# Patient Record
Sex: Female | Born: 1965 | Race: Black or African American | Hispanic: No | Marital: Married | Smoking: Never smoker
Health system: Southern US, Community
[De-identification: ages and names within clinical notes are randomized; demographics above are authoritative.]

## PROBLEM LIST (undated history)

## (undated) DIAGNOSIS — J302 Other seasonal allergic rhinitis: Secondary | ICD-10-CM

## (undated) DIAGNOSIS — Z9221 Personal history of antineoplastic chemotherapy: Secondary | ICD-10-CM

## (undated) DIAGNOSIS — C801 Malignant (primary) neoplasm, unspecified: Secondary | ICD-10-CM

## (undated) DIAGNOSIS — Z1379 Encounter for other screening for genetic and chromosomal anomalies: Secondary | ICD-10-CM

## (undated) DIAGNOSIS — K219 Gastro-esophageal reflux disease without esophagitis: Secondary | ICD-10-CM

## (undated) DIAGNOSIS — Z923 Personal history of irradiation: Secondary | ICD-10-CM

## (undated) HISTORY — PX: BREAST BIOPSY: SHX20

## (undated) HISTORY — DX: Encounter for other screening for genetic and chromosomal anomalies: Z13.79

## (undated) HISTORY — PX: TUBAL LIGATION: SHX77

## (undated) HISTORY — PX: WISDOM TOOTH EXTRACTION: SHX21

## (undated) HISTORY — PX: ABDOMINAL HYSTERECTOMY: SHX81

---

## 1997-09-15 ENCOUNTER — Encounter: Admission: RE | Admit: 1997-09-15 | Discharge: 1997-09-15 | Payer: Self-pay | Admitting: *Deleted

## 2008-11-25 ENCOUNTER — Emergency Department (HOSPITAL_BASED_OUTPATIENT_CLINIC_OR_DEPARTMENT_OTHER): Admission: EM | Admit: 2008-11-25 | Discharge: 2008-11-25 | Payer: Self-pay | Admitting: Emergency Medicine

## 2008-11-28 ENCOUNTER — Emergency Department (HOSPITAL_BASED_OUTPATIENT_CLINIC_OR_DEPARTMENT_OTHER): Admission: EM | Admit: 2008-11-28 | Discharge: 2008-11-28 | Payer: Self-pay | Admitting: Emergency Medicine

## 2009-01-07 ENCOUNTER — Emergency Department (HOSPITAL_BASED_OUTPATIENT_CLINIC_OR_DEPARTMENT_OTHER): Admission: EM | Admit: 2009-01-07 | Discharge: 2009-01-07 | Payer: Self-pay | Admitting: Emergency Medicine

## 2012-06-15 ENCOUNTER — Encounter (HOSPITAL_COMMUNITY): Payer: Self-pay | Admitting: Pharmacist

## 2012-06-16 ENCOUNTER — Encounter (HOSPITAL_COMMUNITY)
Admission: RE | Admit: 2012-06-16 | Discharge: 2012-06-16 | Disposition: A | Discharge status: Home / Self Care | Payer: BC Managed Care – PPO | Source: Ambulatory Visit | Attending: Obstetrics and Gynecology | Admitting: Obstetrics and Gynecology

## 2012-06-16 ENCOUNTER — Encounter (HOSPITAL_COMMUNITY): Payer: Self-pay

## 2012-06-16 HISTORY — DX: Gastro-esophageal reflux disease without esophagitis: K21.9

## 2012-06-16 LAB — CBC
HCT: 40.2 % (ref 36.0–46.0)
Platelets: 245 10*3/uL (ref 150–400)
RDW: 12.3 % (ref 11.5–15.5)
WBC: 9.3 10*3/uL (ref 4.0–10.5)

## 2012-06-16 LAB — SURGICAL PCR SCREEN: Staphylococcus aureus: NEGATIVE

## 2012-06-16 NOTE — Patient Instructions (Signed)
Your procedure is scheduled on:06/23/12  Enter through the Main Entrance at :7am Pick up desk phone and dial [phone] and inform us of your arrival.  Please call [phone] if you have any problems the morning of surgery.  Remember: Do not eat or drink after midnight:Tuesday   Take these meds the morning of surgery with a sip of water:none  DO NOT wear jewelry, eye make-up, lipstick,body lotion, or dark fingernail polish. Do not shave for 48 hours prior to surgery.  If you are to be admitted after surgery, leave suitcase in car until your room has been assigned. Patients discharged on the day of surgery will not be allowed to drive home.

## 2012-06-22 NOTE — H&P (Signed)
Kelsey Henry is an 47 y.o. female. She was seen in January for her annual exam.  She complained of heavy menses with severe cramps.  Exam was normal.  All medical and surgical options were discussed, she wishes to proceed with definitive surgical therapy and is admitted for this at this tim  Pertinent Gynecological History  Last mammogram: normal Date: 2012 Last pap: normal Date: 04-2012 OB History: G1, P1001   Menstrual History: No LMP recorded.    Past Medical History  Diagnosis Date  . GERD (gastroesophageal reflux disease)   . Medical history non-contributory     Past Surgical History  Procedure Laterality Date  . Tubal ligation      No family history on file.  Social History:  reports that she has never smoked. She does not have any smokeless tobacco history on file. She reports that she does not drink alcohol or use illicit drugs.  Allergies:  Allergies  Allergen Reactions  . Aspirin Hives    No prescriptions prior to admission    Review of Systems  Respiratory: Negative.   Cardiovascular: Negative.   Gastrointestinal: Negative.   Genitourinary: Negative.     There were no vitals taken for this visit. Physical Exam  Constitutional: She appears well-developed and well-nourished.  Neck: Neck supple. No thyromegaly present.  Cardiovascular: Normal rate, regular rhythm and normal heart sounds.   No murmur heard. Respiratory: Effort normal and breath sounds normal. No respiratory distress. She has no wheezes.  GI: Soft. She exhibits no distension and no mass. There is no tenderness.  Genitourinary: Vagina normal.  Uterus midplanar, normal size No adnexal mass or tenderness    No results found for this or any previous visit (from the past 24 hour(s)).  No results found.  Assessment/Plan: Menorrhagia and dysmenorrhea, she desires definitive surgical therapy.  The procedure, risks, alternatives and chances of relieving her symptoms have all been discussed.   She is being admitted for laparoscopic supracervical hysterectomy and bilateral salpingectomy, will leave ovaries unless abnormal.    MEISINGER,TODD D 06/22/2012, 8:55 PM

## 2012-06-23 ENCOUNTER — Ambulatory Visit (HOSPITAL_COMMUNITY): Payer: BC Managed Care – PPO | Admitting: Anesthesiology

## 2012-06-23 ENCOUNTER — Encounter (HOSPITAL_COMMUNITY)
Admission: RE | Disposition: A | Discharge status: Home / Self Care | Payer: Self-pay | Source: Ambulatory Visit | Attending: Obstetrics and Gynecology

## 2012-06-23 ENCOUNTER — Ambulatory Visit (HOSPITAL_COMMUNITY)
Admission: RE | Admit: 2012-06-23 | Discharge: 2012-06-23 | Disposition: A | Discharge status: Home / Self Care | Payer: BC Managed Care – PPO | Source: Ambulatory Visit | Attending: Obstetrics and Gynecology | Admitting: Obstetrics and Gynecology

## 2012-06-23 ENCOUNTER — Encounter (HOSPITAL_COMMUNITY): Payer: Self-pay | Admitting: Anesthesiology

## 2012-06-23 DIAGNOSIS — N946 Dysmenorrhea, unspecified: Secondary | ICD-10-CM | POA: Diagnosis present

## 2012-06-23 DIAGNOSIS — N920 Excessive and frequent menstruation with regular cycle: Secondary | ICD-10-CM | POA: Insufficient documentation

## 2012-06-23 DIAGNOSIS — N838 Other noninflammatory disorders of ovary, fallopian tube and broad ligament: Secondary | ICD-10-CM | POA: Insufficient documentation

## 2012-06-23 DIAGNOSIS — D251 Intramural leiomyoma of uterus: Secondary | ICD-10-CM | POA: Insufficient documentation

## 2012-06-23 HISTORY — PX: BILATERAL SALPINGECTOMY: SHX5743

## 2012-06-23 HISTORY — PX: LAPAROSCOPIC SUPRACERVICAL HYSTERECTOMY: SHX5399

## 2012-06-23 LAB — PREGNANCY, URINE: Preg Test, Ur: NEGATIVE

## 2012-06-23 SURGERY — HYSTERECTOMY, SUPRACERVICAL, LAPAROSCOPIC
Anesthesia: General | Site: Abdomen | Wound class: Clean Contaminated

## 2012-06-23 MED ORDER — ONDANSETRON HCL 4 MG PO TABS: 4.0000 mg | ORAL_TABLET | Freq: Four times a day (QID) | ORAL | Status: DC | PRN

## 2012-06-23 MED ORDER — DEXAMETHASONE SODIUM PHOSPHATE 10 MG/ML IJ SOLN
INTRAMUSCULAR | Status: AC
  Filled 2012-06-23: qty 1

## 2012-06-23 MED ORDER — PANTOPRAZOLE SODIUM 40 MG PO TBEC
DELAYED_RELEASE_TABLET | ORAL | Status: AC
  Administered 2012-06-23: 40 mg via ORAL
  Filled 2012-06-23: qty 1

## 2012-06-23 MED ORDER — ONDANSETRON HCL 4 MG/2ML IJ SOLN: 4.0000 mg | Freq: Four times a day (QID) | INTRAMUSCULAR | Status: DC | PRN

## 2012-06-23 MED ORDER — NEOSTIGMINE METHYLSULFATE 1 MG/ML IJ SOLN
INTRAMUSCULAR | Status: AC
  Filled 2012-06-23: qty 1

## 2012-06-23 MED ORDER — BUPIVACAINE HCL (PF) 0.25 % IJ SOLN
INTRAMUSCULAR | Status: DC | PRN
  Administered 2012-06-23: 12 mL

## 2012-06-23 MED ORDER — ROCURONIUM BROMIDE 50 MG/5ML IV SOLN
INTRAVENOUS | Status: AC
  Filled 2012-06-23: qty 1

## 2012-06-23 MED ORDER — MIDAZOLAM HCL 2 MG/2ML IJ SOLN: 0.5000 mg | Freq: Once | INTRAMUSCULAR | Status: DC | PRN

## 2012-06-23 MED ORDER — FAMOTIDINE 20 MG PO TABS: 20.0000 mg | ORAL_TABLET | Freq: Every day | ORAL | Status: DC

## 2012-06-23 MED ORDER — ONDANSETRON HCL 4 MG/2ML IJ SOLN
INTRAMUSCULAR | Status: DC | PRN
  Administered 2012-06-23: 4 mg via INTRAVENOUS

## 2012-06-23 MED ORDER — LACTATED RINGERS IV SOLN: INTRAVENOUS | Status: DC

## 2012-06-23 MED ORDER — HYDROMORPHONE HCL PF 1 MG/ML IJ SOLN
INTRAMUSCULAR | Status: AC
  Filled 2012-06-23: qty 1

## 2012-06-23 MED ORDER — KETOROLAC TROMETHAMINE 30 MG/ML IJ SOLN: 30.0000 mg | Freq: Four times a day (QID) | INTRAMUSCULAR | Status: DC

## 2012-06-23 MED ORDER — MIDAZOLAM HCL 2 MG/2ML IJ SOLN
INTRAMUSCULAR | Status: AC
  Filled 2012-06-23: qty 2

## 2012-06-23 MED ORDER — PANTOPRAZOLE SODIUM 40 MG PO TBEC: 40.0000 mg | DELAYED_RELEASE_TABLET | Freq: Every day | ORAL | Status: AC

## 2012-06-23 MED ORDER — GLYCOPYRROLATE 0.2 MG/ML IJ SOLN
INTRAMUSCULAR | Status: DC | PRN
  Administered 2012-06-23: 0.4 mg via INTRAVENOUS

## 2012-06-23 MED ORDER — LACTATED RINGERS IV SOLN
INTRAVENOUS | Status: DC
  Administered 2012-06-23 (×2): via INTRAVENOUS

## 2012-06-23 MED ORDER — GLYCOPYRROLATE 0.2 MG/ML IJ SOLN
INTRAMUSCULAR | Status: AC
  Filled 2012-06-23: qty 2

## 2012-06-23 MED ORDER — FENTANYL CITRATE 0.05 MG/ML IJ SOLN: 25.0000 ug | INTRAMUSCULAR | Status: DC | PRN

## 2012-06-23 MED ORDER — CEFAZOLIN SODIUM-DEXTROSE 2-3 GM-% IV SOLR
INTRAVENOUS | Status: AC
  Filled 2012-06-23: qty 50

## 2012-06-23 MED ORDER — MEPERIDINE HCL 25 MG/ML IJ SOLN: 6.2500 mg | INTRAMUSCULAR | Status: DC | PRN

## 2012-06-23 MED ORDER — ROCURONIUM BROMIDE 100 MG/10ML IV SOLN
INTRAVENOUS | Status: DC | PRN
  Administered 2012-06-23: 50 mg via INTRAVENOUS

## 2012-06-23 MED ORDER — LIDOCAINE HCL (CARDIAC) 20 MG/ML IV SOLN
INTRAVENOUS | Status: AC
  Filled 2012-06-23: qty 5

## 2012-06-23 MED ORDER — BUPIVACAINE HCL (PF) 0.25 % IJ SOLN
INTRAMUSCULAR | Status: AC
  Filled 2012-06-23: qty 30

## 2012-06-23 MED ORDER — DEXTROSE-NACL 5-0.45 % IV SOLN
INTRAVENOUS | Status: DC
  Administered 2012-06-23: 13:00:00 via INTRAVENOUS

## 2012-06-23 MED ORDER — MIDAZOLAM HCL 5 MG/5ML IJ SOLN
INTRAMUSCULAR | Status: DC | PRN
  Administered 2012-06-23: 2 mg via INTRAVENOUS

## 2012-06-23 MED ORDER — PROPOFOL 10 MG/ML IV BOLUS
INTRAVENOUS | Status: DC | PRN
  Administered 2012-06-23: 150 mg via INTRAVENOUS

## 2012-06-23 MED ORDER — CEFAZOLIN SODIUM-DEXTROSE 2-3 GM-% IV SOLR
2.0000 g | INTRAVENOUS | Status: AC
  Administered 2012-06-23: 2 g via INTRAVENOUS

## 2012-06-23 MED ORDER — KETOROLAC TROMETHAMINE 30 MG/ML IJ SOLN: 30.0000 mg | Freq: Once | INTRAMUSCULAR | Status: DC

## 2012-06-23 MED ORDER — ONDANSETRON HCL 4 MG/2ML IJ SOLN
INTRAMUSCULAR | Status: AC
  Filled 2012-06-23: qty 2

## 2012-06-23 MED ORDER — PROPOFOL 10 MG/ML IV EMUL
INTRAVENOUS | Status: AC
  Filled 2012-06-23: qty 20

## 2012-06-23 MED ORDER — OXYCODONE-ACETAMINOPHEN 5-325 MG PO TABS
1.0000 | ORAL_TABLET | ORAL | Status: DC | PRN
  Filled 2012-06-23: qty 1

## 2012-06-23 MED ORDER — FENTANYL CITRATE 0.05 MG/ML IJ SOLN
INTRAMUSCULAR | Status: AC
  Filled 2012-06-23: qty 5

## 2012-06-23 MED ORDER — DEXAMETHASONE SODIUM PHOSPHATE 10 MG/ML IJ SOLN
INTRAMUSCULAR | Status: DC | PRN
  Administered 2012-06-23: 10 mg via INTRAVENOUS

## 2012-06-23 MED ORDER — PROMETHAZINE HCL 25 MG/ML IJ SOLN: 6.2500 mg | INTRAMUSCULAR | Status: DC | PRN

## 2012-06-23 MED ORDER — ALUM & MAG HYDROXIDE-SIMETH 200-200-20 MG/5ML PO SUSP: 30.0000 mL | ORAL | Status: DC | PRN

## 2012-06-23 MED ORDER — FENTANYL CITRATE 0.05 MG/ML IJ SOLN
INTRAMUSCULAR | Status: DC | PRN
  Administered 2012-06-23 (×2): 50 ug via INTRAVENOUS
  Administered 2012-06-23: 100 ug via INTRAVENOUS
  Administered 2012-06-23: 50 ug via INTRAVENOUS

## 2012-06-23 MED ORDER — SIMETHICONE 80 MG PO CHEW
80.0000 mg | CHEWABLE_TABLET | Freq: Four times a day (QID) | ORAL | Status: DC | PRN
  Administered 2012-06-23: 80 mg via ORAL

## 2012-06-23 MED ORDER — NEOSTIGMINE METHYLSULFATE 1 MG/ML IJ SOLN
INTRAMUSCULAR | Status: DC | PRN
  Administered 2012-06-23: 2 mg via INTRAVENOUS

## 2012-06-23 MED ORDER — LIDOCAINE HCL (CARDIAC) 20 MG/ML IV SOLN
INTRAVENOUS | Status: DC | PRN
  Administered 2012-06-23: 50 mg via INTRAVENOUS

## 2012-06-23 MED ORDER — OXYCODONE-ACETAMINOPHEN 5-325 MG PO TABS: 1.0000 | ORAL_TABLET | ORAL | Status: DC | PRN

## 2012-06-23 MED ORDER — SODIUM CHLORIDE 0.9 % IJ SOLN
INTRAMUSCULAR | Status: DC | PRN
  Administered 2012-06-23: 10 mL

## 2012-06-23 MED ORDER — LACTATED RINGERS IR SOLN
Status: DC | PRN
  Administered 2012-06-23: 3000 mL

## 2012-06-23 MED ORDER — HYDROMORPHONE HCL PF 1 MG/ML IJ SOLN
INTRAMUSCULAR | Status: DC | PRN
  Administered 2012-06-23: 1 mg via INTRAVENOUS

## 2012-06-23 MED ORDER — MENTHOL 3 MG MT LOZG: 1.0000 | LOZENGE | OROMUCOSAL | Status: DC | PRN

## 2012-06-23 SURGICAL SUPPLY — 28 items
ADH SKN CLS APL DERMABOND .7 (GAUZE/BANDAGES/DRESSINGS) ×2
BARRIER ADHS 3X4 INTERCEED (GAUZE/BANDAGES/DRESSINGS) ×1 IMPLANT
BLADE LAPAROSCOPIC MORCELL KIT (BLADE) ×1 IMPLANT
BRR ADH 4X3 ABS CNTRL BYND (GAUZE/BANDAGES/DRESSINGS) ×2
CABLE HIGH FREQUENCY MONO STRZ (ELECTRODE) IMPLANT
CHLORAPREP W/TINT 26ML (MISCELLANEOUS) ×3 IMPLANT
CLOTH BEACON ORANGE TIMEOUT ST (SAFETY) ×3 IMPLANT
COVER MAYO STAND STRL (DRAPES) ×3 IMPLANT
DERMABOND ADVANCED (GAUZE/BANDAGES/DRESSINGS) ×1
DERMABOND ADVANCED .7 DNX12 (GAUZE/BANDAGES/DRESSINGS) ×2 IMPLANT
EVACUATOR SMOKE 8.L (FILTER) ×6 IMPLANT
GLOVE BIO SURGEON STRL SZ8 (GLOVE) ×3 IMPLANT
GLOVE ORTHO TXT STRL SZ7.5 (GLOVE) ×3 IMPLANT
GOWN STRL REIN XL XLG (GOWN DISPOSABLE) ×9 IMPLANT
NEEDLE INSUFFLATION 120MM (ENDOMECHANICALS) ×3 IMPLANT
NS IRRIG 1000ML POUR BTL (IV SOLUTION) ×3 IMPLANT
PACK LAPAROSCOPY BASIN (CUSTOM PROCEDURE TRAY) ×3 IMPLANT
SCALPEL HARMONIC ACE (MISCELLANEOUS) ×1 IMPLANT
SET IRRIG TUBING LAPAROSCOPIC (IRRIGATION / IRRIGATOR) ×3 IMPLANT
SUT VIC AB 3-0 PS2 18 (SUTURE) ×3
SUT VIC AB 3-0 PS2 18XBRD (SUTURE) ×2 IMPLANT
SUT VICRYL 0 UR6 27IN ABS (SUTURE) ×3 IMPLANT
TOWEL OR 17X24 6PK STRL BLUE (TOWEL DISPOSABLE) ×6 IMPLANT
TRAY FOLEY CATH 14FR (SET/KITS/TRAYS/PACK) ×3 IMPLANT
TROCAR XCEL 12X100 BLDLESS (ENDOMECHANICALS) ×3 IMPLANT
TROCAR XCEL NON-BLD 5MMX100MML (ENDOMECHANICALS) ×6 IMPLANT
WARMER LAPAROSCOPE (MISCELLANEOUS) ×3 IMPLANT
WATER STERILE IRR 1000ML POUR (IV SOLUTION) ×3 IMPLANT

## 2012-06-23 NOTE — Anesthesia Preprocedure Evaluation (Signed)
Anesthesia Evaluation  Patient identified by MRN, date of birth, ID band Patient awake    Reviewed: Allergy & Precautions, H&P , Patient's Chart, lab work & pertinent test results, reviewed documented beta blocker date and time   History of Anesthesia Complications Negative for: history of anesthetic complications  Airway Mallampati: II TM Distance: >3 FB Neck ROM: full    Dental no notable dental hx.    Pulmonary neg pulmonary ROS,  breath sounds clear to auscultation  Pulmonary exam normal       Cardiovascular Exercise Tolerance: Good negative cardio ROS  Rhythm:regular Rate:Normal     Neuro/Psych negative neurological ROS  negative psych ROS   GI/Hepatic negative GI ROS, Neg liver ROS, GERD-  ,  Endo/Other  negative endocrine ROS  Renal/GU negative Renal ROS     Musculoskeletal   Abdominal   Peds  Hematology negative hematology ROS (+)   Anesthesia Other Findings   Reproductive/Obstetrics negative OB ROS                           Anesthesia Physical Anesthesia Plan  ASA: II  Anesthesia Plan: General ETT   Post-op Pain Management:    Induction:   Airway Management Planned:   Additional Equipment:   Intra-op Plan:   Post-operative Plan:   Informed Consent: I have reviewed the patients History and Physical, chart, labs and discussed the procedure including the risks, benefits and alternatives for the proposed anesthesia with the patient or authorized representative who has indicated his/her understanding and acceptance.   Dental Advisory Given  Plan Discussed with: CRNA and Surgeon  Anesthesia Plan Comments:         Anesthesia Quick Evaluation

## 2012-06-23 NOTE — Interval H&P Note (Signed)
History and Physical Interval Note:  06/23/2012 8:01 AM  Kelsey Henry  has presented today for surgery, with the diagnosis of dysmenorrhea, [id number], [phone]  The various methods of treatment have been discussed with the patient and family. After consideration of risks, benefits and other options for treatment, the patient has consented to  Procedure(s): LAPAROSCOPIC SUPRACERVICAL HYSTERECTOMY (N/A) BILATERAL SALPINGECTOMY (Bilateral) as a surgical intervention .  The patient's history has been reviewed, patient examined, no change in status, stable for surgery.  I have reviewed the patient's chart and labs.  Questions were answered to the patient's satisfaction.     MEISINGER,TODD D

## 2012-06-23 NOTE — Progress Notes (Signed)
Post-op check Doing well, minimal pain, some nausea Afeb, VSS, voided Abd- soft, incisions ok Will d/c home if tolerates reg diet

## 2012-06-23 NOTE — Transfer of Care (Signed)
Immediate Anesthesia Transfer of Care Note  Patient: Kelsey Henry  Procedure(s) Performed: Procedure(s): LAPAROSCOPIC SUPRACERVICAL HYSTERECTOMY (N/A) BILATERAL SALPINGECTOMY (Bilateral)  Patient Location: PACU  Anesthesia Type:General  Level of Consciousness: sedated  Airway & Oxygen Therapy: Patient Spontanous Breathing and Patient connected to nasal cannula oxygen  Post-op Assessment: Report given to PACU RN and Post -op Vital signs reviewed and stable  Post vital signs: stable  Complications: No apparent anesthesia complications

## 2012-06-23 NOTE — Progress Notes (Signed)
Patient discharged home with husband. No home health health or equipment needed. Walked down to main entrance by D. Sandy Salaam, [place]. Patient in stable condition.

## 2012-06-23 NOTE — Anesthesia Postprocedure Evaluation (Signed)
  Anesthesia Post-op Note  Patient: Kelsey Henry  Procedure(s) Performed: Procedure(s): LAPAROSCOPIC SUPRACERVICAL HYSTERECTOMY (N/A) BILATERAL SALPINGECTOMY (Bilateral) Patient is awake and responsive. Pain and nausea are reasonably well controlled. Vital signs are stable and clinically acceptable. Oxygen saturation is clinically acceptable. There are no apparent anesthetic complications at this time. Patient is ready for discharge.

## 2012-06-23 NOTE — Anesthesia Postprocedure Evaluation (Signed)
  Anesthesia Post-op Note  Patient: Kelsey Henry  Procedure(s) Performed: Procedure(s): LAPAROSCOPIC SUPRACERVICAL HYSTERECTOMY (N/A) BILATERAL SALPINGECTOMY (Bilateral)  Patient Location: PACU and [facility] Unit  Anesthesia Type:General  Level of Consciousness: awake, alert  and oriented  Airway and Oxygen Therapy: Patient Spontanous Breathing  Post-op Pain: none  Post-op Assessment: Post-op Vital signs reviewed  Post-op Vital Signs: Reviewed and stable  Complications: No apparent anesthesia complications

## 2012-06-23 NOTE — Op Note (Signed)
Preoperative diagnosis: Dysmenorrhea, menorrhagia Postoperative diagnosis: Same Procedure: Laparoscopic supracervical hysterectomy, bilateral salpingectomy Surgeon: Lavina Hamman M.D. Assistant: Sherian Rein, MD Anesthesia: Gen. Endotracheal tube Findings: She had an essentially normal pelvis with evidence of prior BTL, nl appendix and upper abdomen Specimens: Morcellated uterus and bilateral tubes for routine pathology Estimated blood loss: 100 cc Complications: None  Procedure in detail: The patient was taken to the operating room and placed in the dorsosupine position. General anesthesia was induced. Arms were tucked to her sides and legs were placed in mobile stirrups. Abdomen perineum and vagina were then prepped and draped in usual sterile fashion and a Foley catheter was inserted. Infraumbilical skin was infiltrated with quarter percent Marcaine and a 1 cm vertical incision was made. A veress needle was inserted into the peritoneal cavity and placement confirmed by the water drop test an opening pressure of 3 mm of mercury. CO2 was insufflated to a pressure of 13 mm mercury and a veress needle was removed. A 5 mm trocar was then introduced with direct visualization with the laparoscope. A 5 mm port was then also placed on the right side under direct visualization. Inspection revealed the above-mentioned findings. A 12 mm port was placed on the left side under direct visualization. The distal left fallopian tube was grasped and elevated.  Using the Harmonic scalpel Ace, the distal tube was removed from the mesosalpinx and removed through the 12mm trocar.  The left uterine cornu was grasped with a single-tooth tenaculum from the right side. The Harmonic scalpel Ace was used to take down the left round ligament, utero-ovarian pedicle and broad ligament. The anterior peritoneum was incised across the anterior portion of the uterus to help release the bladder. Uterine artery artery was  skeletonized and taken down with the harmonic scalpel Ace with adequate division and adequate hemostasis. A similar procedure was then performed on the patient's right side, first removing the fallopian tube, then taking down the round ligament, utero-ovarian pedicle, and broad ligament. Anterior peritoneum was incised across the anterior portion the uterus to meet the incision coming from the patient's right side. Uterine artery was skeletonized and taken down with the Harmonic Scalpel with adequate division and adequate hemostasis. I then began to remove the uterus from the cervix using a drill, clamp, cut technique on maximum power, using the Harmonic scalpel Ace. This was done about halfway on the left side and then halfway on the right side removing the uterus from the cervix. The cervical stump appeared to be hemostatic. The 12 mm port was removed and the disposable morcellator was introduced with direct visualization. The uterus was removed via morcellation without difficulty. All visible pieces were removed. The morcellator was removed and the 12 mm trocar was reintroduced. Pelvis was copiously irrigated. Small amount of bleeding from the cervical stump was controlled with the harmonic scalpel Ace. A piece of Interceed was placed over the cervical stump. At this point all pedicles appeared to be hemostatic and there was no other pathology noted. The 12 mm port was removed and I closed this with a 0 Vicryl suture while Dr. Hinton Rao watched from a 5 mm port to make sure I did not include bowel in this closure. The remaining 5 mm ports were removed under direct visualization all gas was allowed to deflate from the abdomen. Skin incisions were closed with interrupted subcuticular sutures of 4-0 Vicryl followed by Dermabond. The patient tolerated the procedure well. She was taken to the recovery in stable condition. Counts were  correct x2, she received Ancef 2 g IV at the beginning of the procedure and had  PAS hose on throughout the procedure.

## 2012-06-24 ENCOUNTER — Encounter (HOSPITAL_COMMUNITY): Payer: Self-pay | Admitting: Obstetrics and Gynecology

## 2012-06-24 NOTE — Discharge Summary (Signed)
Physician Discharge Summary  Patient ID: Kelsey Henry MRN: [id number] DOB/AGE: [birth year] 47 y.o.  Admit date: 06/23/2012 Discharge date: 06-23-2012  Admission Diagnoses:  Menorrhagia, dysmenorrhea  Discharge Diagnoses:  Same Active Problems:   Menorrhagia   Dysmenorrhea   Discharged Condition: stable  Hospital Course: Admitted for laparoscopic supracervical hysterectomy with bilateral salpingectomy, surgery without complications.  Stable evening of surgery for discharge.  Consults: None  Discharge Exam: Blood pressure 121/76, pulse 80, temperature 98.2 F (36.8 C), temperature source Oral, resp. rate 18, height 5' (1.524 m), weight 62.143 kg (137 lb), SpO2 98.00%. General appearance: alert  Disposition: 01-Home or Self Care  Discharge Orders   Future Orders Complete By Expires     Diet - low sodium heart healthy  As directed     Increase activity slowly  As directed         Medication List    TAKE these medications       ibuprofen 200 MG tablet  Commonly known as:  ADVIL,MOTRIN  Take 200 mg by mouth every 6 (six) hours as needed for pain.     multivitamin with minerals Tabs  Take 1 tablet by mouth daily. One-A-Day brand.     oxyCODONE-acetaminophen 5-325 MG per tablet  Commonly known as:  PERCOCET/ROXICET  Take 1-2 tablets by mouth every 4 (four) hours as needed.     ranitidine 150 MG tablet  Commonly known as:  ZANTAC  Take 150 mg by mouth at bedtime.           Follow-up Information   Follow up with MEISINGER,TODD D, MD. Schedule an appointment as soon as possible for a visit in 2 weeks.   Contact information:   [address], [address] [place] [address] [phone]       Signed: Zenaida Niece 06/24/2012, 6:37 PM

## 2014-10-12 ENCOUNTER — Encounter (HOSPITAL_COMMUNITY): Payer: Self-pay | Admitting: *Deleted

## 2014-10-31 ENCOUNTER — Ambulatory Visit (HOSPITAL_COMMUNITY): Payer: BLUE CROSS/BLUE SHIELD | Admitting: Anesthesiology

## 2014-10-31 NOTE — Anesthesia Preprocedure Evaluation (Deleted)
Anesthesia Evaluation  Patient identified by MRN, date of birth, ID band Patient awake    Reviewed: Allergy & Precautions, NPO status , Patient's Chart, lab work & pertinent test results  History of Anesthesia Complications Negative for: history of anesthetic complications  Airway Mallampati: I  TM Distance: >3 FB Neck ROM: Full    Dental   Pulmonary neg pulmonary ROS,    Pulmonary exam normal       Cardiovascular negative cardio ROS Normal cardiovascular exam    Neuro/Psych negative neurological ROS  negative psych ROS   GI/Hepatic Neg liver ROS,   Endo/Other  negative endocrine ROS  Renal/GU negative Renal ROS  negative genitourinary   Musculoskeletal negative musculoskeletal ROS (+)   Abdominal   Peds negative pediatric ROS (+)  Hematology negative hematology ROS (+)   Anesthesia Other Findings NPO appropriate, allergies reviewed Denies active cardiac or pulmonary symptoms, METS > 4 No recent congestive cough or symptoms of upper respiratory infection Meds - none    Reproductive/Obstetrics negative OB ROS                            Anesthesia Physical Anesthesia Plan  ASA: I  Anesthesia Plan: MAC   Post-op Pain Management:    Induction: Intravenous  Airway Management Planned: Natural Airway  Additional Equipment:   Intra-op Plan:   Post-operative Plan:   Informed Consent: I have reviewed the patients History and Physical, chart, labs and discussed the procedure including the risks, benefits and alternatives for the proposed anesthesia with the patient or authorized representative who has indicated his/her understanding and acceptance.     Plan Discussed with: CRNA and Anesthesiologist  Anesthesia Plan Comments:        Anesthesia Quick Evaluation

## 2014-10-31 NOTE — H&P (Signed)
Kelsey Henry is an 49 y.o. female. She had a lesion removed from her mons 2 years ago with benign pathology.  She now has either a keloid or a recurrent lesion in the same spot, bigger than before, and presents for removal.  Pertinent Gynecological History: Last mammogram: normal Date: 09/2014 Last pap: normal Date: 04/2012 OB History: G1, P1001 SVD   Menstrual History: Patient's last menstrual period was 05/26/2012.    Past Medical History  Diagnosis Date  . SVD (spontaneous vaginal delivery)     x 1  . GERD (gastroesophageal reflux disease)     no meds  . Seasonal allergies     Past Surgical History  Procedure Laterality Date  . Tubal ligation    . Laparoscopic supracervical hysterectomy N/A 06/23/2012    Procedure: LAPAROSCOPIC SUPRACERVICAL HYSTERECTOMY;  Surgeon: Cheri Fowler, MD;  Location: [facility];  Service: Gynecology;  Laterality: N/A;  . Bilateral salpingectomy Bilateral 06/23/2012    Procedure: BILATERAL SALPINGECTOMY;  Surgeon: Cheri Fowler, MD;  Location: [facility];  Service: Gynecology;  Laterality: Bilateral;  . Wisdom tooth extraction      History reviewed. No pertinent family history.  Social History:  reports that she has never smoked. She has never used smokeless tobacco. She reports that she does not drink alcohol or use illicit drugs.  Allergies:  Allergies  Allergen Reactions  . Aspirin Hives    No prescriptions prior to admission    Review of Systems  Respiratory: Negative.   Cardiovascular: Negative.   Gastrointestinal: Negative.   Genitourinary: Negative.     Height 5\' 1"  (1.549 m), weight 63.504 kg (140 lb), last menstrual period 05/26/2012. Physical Exam  Constitutional: She appears well-developed and well-nourished.  Neck: Neck supple. No thyromegaly present.  Cardiovascular: Normal rate, regular rhythm and normal heart sounds.   No murmur heard. Respiratory: Effort normal and breath sounds normal. No respiratory distress. She has no  wheezes.  GI: Soft. She exhibits no distension and no mass. There is no tenderness.  Genitourinary: Vagina normal and uterus normal.  2 cm raised lesion on mons    No results found for this or any previous visit (from the past 24 hour(s)).  No results found.  Assessment/Plan: Recurrent lesion on her mons.  Surgical procedure and risks have been discussed.  Will admit for removal with local anesthesia, will inject with kenalog after removal to try to prevent keloid formation.  She does not need IV sedation unless she requests it.  Jmarion Christiano D 10/31/2014, 8:49 PM

## 2014-11-01 ENCOUNTER — Encounter (HOSPITAL_COMMUNITY)
Admission: RE | Disposition: A | Discharge status: Home / Self Care | Payer: Self-pay | Source: Ambulatory Visit | Attending: Obstetrics and Gynecology

## 2014-11-01 ENCOUNTER — Ambulatory Visit (HOSPITAL_COMMUNITY)
Admission: RE | Admit: 2014-11-01 | Discharge: 2014-11-01 | Disposition: A | Discharge status: Home / Self Care | Payer: BLUE CROSS/BLUE SHIELD | Source: Ambulatory Visit | Attending: Obstetrics and Gynecology | Admitting: Obstetrics and Gynecology

## 2014-11-01 ENCOUNTER — Encounter (HOSPITAL_COMMUNITY): Payer: Self-pay | Admitting: Certified Registered"

## 2014-11-01 DIAGNOSIS — Z886 Allergy status to analgesic agent status: Secondary | ICD-10-CM | POA: Diagnosis not present

## 2014-11-01 DIAGNOSIS — L720 Epidermal cyst: Secondary | ICD-10-CM | POA: Diagnosis not present

## 2014-11-01 DIAGNOSIS — L910 Hypertrophic scar: Secondary | ICD-10-CM | POA: Diagnosis not present

## 2014-11-01 DIAGNOSIS — K219 Gastro-esophageal reflux disease without esophagitis: Secondary | ICD-10-CM | POA: Diagnosis not present

## 2014-11-01 DIAGNOSIS — N9089 Other specified noninflammatory disorders of vulva and perineum: Secondary | ICD-10-CM

## 2014-11-01 DIAGNOSIS — L989 Disorder of the skin and subcutaneous tissue, unspecified: Secondary | ICD-10-CM | POA: Diagnosis present

## 2014-11-01 HISTORY — DX: Other seasonal allergic rhinitis: J30.2

## 2014-11-01 HISTORY — PX: VULVAR LESION REMOVAL: SHX5391

## 2014-11-01 LAB — CBC
HEMATOCRIT: 40.4 % (ref 36.0–46.0)
HEMOGLOBIN: 13.6 g/dL (ref 12.0–15.0)
MCH: 29.7 pg (ref 26.0–34.0)
MCHC: 33.7 g/dL (ref 30.0–36.0)
MCV: 88.2 fL (ref 78.0–100.0)
PLATELETS: 279 10*3/uL (ref 150–400)
RBC: 4.58 MIL/uL (ref 3.87–5.11)
RDW: 12.9 % (ref 11.5–15.5)
WBC: 6.3 10*3/uL (ref 4.0–10.5)

## 2014-11-01 LAB — TYPE AND SCREEN
ABO/RH(D): B NEG
Antibody Screen: NEGATIVE

## 2014-11-01 LAB — ABO/RH: ABO/RH(D): B NEG

## 2014-11-01 SURGERY — VULVAR LESION
Anesthesia: Monitor Anesthesia Care | Site: Vulva

## 2014-11-01 MED ORDER — HYDROCODONE-ACETAMINOPHEN 5-325 MG PO TABS: 1.0000 | ORAL_TABLET | Freq: Four times a day (QID) | ORAL | Status: DC | PRN

## 2014-11-01 MED ORDER — FENTANYL CITRATE (PF) 100 MCG/2ML IJ SOLN
INTRAMUSCULAR | Status: AC
  Filled 2014-11-01: qty 2

## 2014-11-01 MED ORDER — BACITRACIN-NEOMYCIN-POLYMYXIN 400-5-5000 EX OINT
TOPICAL_OINTMENT | CUTANEOUS | Status: AC
  Filled 2014-11-01: qty 1

## 2014-11-01 MED ORDER — BUPIVACAINE-EPINEPHRINE (PF) 0.5% -1:200000 IJ SOLN
INTRAMUSCULAR | Status: AC
  Filled 2014-11-01: qty 30

## 2014-11-01 MED ORDER — FENTANYL CITRATE (PF) 100 MCG/2ML IJ SOLN: 25.0000 ug | INTRAMUSCULAR | Status: DC | PRN

## 2014-11-01 MED ORDER — BUPIVACAINE-EPINEPHRINE 0.5% -1:200000 IJ SOLN
INTRAMUSCULAR | Status: DC | PRN
  Administered 2014-11-01: 10 mL

## 2014-11-01 MED ORDER — TRIAMCINOLONE ACETONIDE 40 MG/ML IJ SUSP
40.0000 mg | Freq: Once | INTRAMUSCULAR | Status: AC
  Administered 2014-11-01: 40 mg via INTRAMUSCULAR
  Filled 2014-11-01: qty 1

## 2014-11-01 MED ORDER — PROMETHAZINE HCL 25 MG/ML IJ SOLN: 6.2500 mg | INTRAMUSCULAR | Status: DC | PRN

## 2014-11-01 MED ORDER — MIDAZOLAM HCL 2 MG/2ML IJ SOLN
INTRAMUSCULAR | Status: AC
  Filled 2014-11-01: qty 2

## 2014-11-01 SURGICAL SUPPLY — 20 items
BLADE SURG 15 STRL LF C SS BP (BLADE) ×1 IMPLANT
BLADE SURG 15 STRL SS (BLADE) ×2
CLOTH BEACON ORANGE TIMEOUT ST (SAFETY) ×2 IMPLANT
COUNTER NEEDLE 1200 MAGNETIC (NEEDLE) ×2 IMPLANT
DRSG COVADERM PLUS 2X2 (GAUZE/BANDAGES/DRESSINGS) ×1 IMPLANT
ELECT REM PT RETURN 9FT ADLT (ELECTROSURGICAL) ×2
ELECTRODE REM PT RTRN 9FT ADLT (ELECTROSURGICAL) ×1 IMPLANT
EVACUATOR PREFILTER SMOKE (MISCELLANEOUS) IMPLANT
GLOVE BIO SURGEON STRL SZ8 (GLOVE) ×2 IMPLANT
GLOVE ORTHO TXT STRL SZ7.5 (GLOVE) ×2 IMPLANT
GOWN STRL REUS W/TWL LRG LVL3 (GOWN DISPOSABLE) ×4 IMPLANT
HOSE NS SMOKE EVAC 7/8 X6 (MISCELLANEOUS) IMPLANT
LIQUID BAND (GAUZE/BANDAGES/DRESSINGS) ×1 IMPLANT
PACK VAGINAL MINOR WOMEN LF (CUSTOM PROCEDURE TRAY) ×2 IMPLANT
PAD OB MATERNITY 4.3X12.25 (PERSONAL CARE ITEMS) ×2 IMPLANT
PENCIL BUTTON HOLSTER BLD 10FT (ELECTRODE) ×1 IMPLANT
REDUCER FITTING SMOKE EVAC (MISCELLANEOUS) IMPLANT
SUT VIC AB 2-0 CT1 (SUTURE) ×1 IMPLANT
TOWEL OR 17X24 6PK STRL BLUE (TOWEL DISPOSABLE) ×4 IMPLANT
WATER STERILE IRR 1000ML POUR (IV SOLUTION) ×2 IMPLANT

## 2014-11-01 NOTE — Op Note (Signed)
Preoperative diagnosis: Vulvar lesion Postoperative diagnosis: Vulvar lesion Procedure: Removal of vulvar lesion Surgeon: Cheri Fowler M.D. Anesthesia:  local Findings: She had a 2 cm raised lesion on her right lower mons Specimens: Vulvar lesion Estimated blood loss minimal  Procedure in detail:  Patient was taken to the operating room and placed in the dorsosupine position. The area around this lesion was cleaned with Betadine. She was then prepped and draped in the usual sterile fashion. The area around and beneath this vulvar lesion was infiltrated with half percent Marcaine with epi. The lesion was removed in its entirety sharply. Bleeding was controlled with Bovie and skin edges were mobilized also with the Bovie. One deep suture of 2-0 Vicryl was placed to help bring skin edges together. Skin edges were then reapproximated with interrupted subcuticular sutures of 4-0 Vicryl. 1 cc of Kenalog was then infiltrated under the skin edges. Liqui-band was then placed over the incision. She tolerated the procedure well and was taken to the recovery room in stable condition, counts were correct.

## 2014-11-01 NOTE — Discharge Instructions (Signed)
Excision of Skin Lesions  AFTER THE PROCEDURE   How well you heal depends on many factors. Most patients heal quite well with proper techniques and self-care. Scarring will lessen over time.  HOME CARE INSTRUCTIONS:  Avoid strenuous activity for the next week, call with problems  Take medicines for pain as directed.  Keep the incision area clean, dry, and protected for at least 48 hours. Change dressings as directed.  For bleeding, apply gentle but firm pressure to the wound using a folded towel for 20 minutes. Call your caregiver if bleeding does not stop.  Avoid high-impact exercise and activities until the stitches are removed or the area heals.  Follow your caregiver's instructions to minimize scarring. Avoid sun exposure until the area has healed. Scarring should lessen over time.  Follow up with your caregiver as directed. Removal of stitches within 4 to 14 days may be necessary.  Finding out the results of your test Not all test results are available during your visit. If your test results are not back during the visit, make an appointment with your caregiver to find out the results. Do not assume everything is normal if you have not heard from your caregiver or the medical facility. It is important for you to follow up on all of your test results.  SEEK MEDICAL CARE IF:   You or your child has an oral temperature above 102 F (38.9 C).  You develop signs of infection (chills, feeling unwell).  You notice bleeding, pain, discharge, redness, or swelling at the incision site.  You notice skin irregularities or changes in sensation.   MAKE SURE YOU:   Understand these instructions.  Will watch your condition.  Will get help right away if you are not doing well or get worse.   FOR MORE INFORMATION  American Academy of [employer]icians: www.AromatherapyParty.no American Academy of Dermatology: http://jones-macias.info/ Document Released: 06/25/2009 Document Revised: 06/23/2011 Document  Reviewed: 06/25/2009 [facility] Patient Information 2015 [place], [place]. This information is not intended to replace advice given to you by your health care provider. Make sure you discuss any questions you have with your health care provider.

## 2014-11-01 NOTE — Interval H&P Note (Signed)
History and Physical Interval Note:  11/01/2014 8:07 AM  Kelsey Henry  has presented today for surgery, with the diagnosis of vulvar lesion  The various methods of treatment have been discussed with the patient and family. After consideration of risks, benefits and other options for treatment, the patient has consented to  Procedure(s) with comments: VULVAR LESION (N/A) - local anesthesia with IV sedation if needed as a surgical intervention .  The patient's history has been reviewed, patient examined, no change in status, stable for surgery.  I have reviewed the patient's chart and labs.  Questions were answered to the patient's satisfaction.     Audris Speaker D

## 2014-11-02 ENCOUNTER — Encounter (HOSPITAL_COMMUNITY): Payer: Self-pay | Admitting: Obstetrics and Gynecology

## 2015-10-23 DIAGNOSIS — K219 Gastro-esophageal reflux disease without esophagitis: Secondary | ICD-10-CM | POA: Insufficient documentation

## 2016-11-12 DIAGNOSIS — L910 Hypertrophic scar: Secondary | ICD-10-CM | POA: Diagnosis not present

## 2016-12-02 DIAGNOSIS — L910 Hypertrophic scar: Secondary | ICD-10-CM | POA: Diagnosis not present

## 2016-12-16 DIAGNOSIS — L910 Hypertrophic scar: Secondary | ICD-10-CM | POA: Diagnosis not present

## 2016-12-30 DIAGNOSIS — L910 Hypertrophic scar: Secondary | ICD-10-CM | POA: Diagnosis not present

## 2017-01-27 DIAGNOSIS — Z01419 Encounter for gynecological examination (general) (routine) without abnormal findings: Secondary | ICD-10-CM | POA: Diagnosis not present

## 2017-01-27 DIAGNOSIS — Z1231 Encounter for screening mammogram for malignant neoplasm of breast: Secondary | ICD-10-CM | POA: Diagnosis not present

## 2017-01-27 DIAGNOSIS — Z1151 Encounter for screening for human papillomavirus (HPV): Secondary | ICD-10-CM | POA: Diagnosis not present

## 2017-01-27 DIAGNOSIS — Z1389 Encounter for screening for other disorder: Secondary | ICD-10-CM | POA: Diagnosis not present

## 2017-01-27 DIAGNOSIS — Z124 Encounter for screening for malignant neoplasm of cervix: Secondary | ICD-10-CM | POA: Diagnosis not present

## 2017-01-27 DIAGNOSIS — Z130 Encounter for screening for diseases of the blood and blood-forming organs and certain disorders involving the immune mechanism: Secondary | ICD-10-CM | POA: Diagnosis not present

## 2017-01-27 DIAGNOSIS — Z6825 Body mass index (BMI) 25.0-25.9, adult: Secondary | ICD-10-CM | POA: Diagnosis not present

## 2017-01-30 LAB — HM PAP SMEAR: HM Pap smear: NEGATIVE

## 2017-02-03 ENCOUNTER — Other Ambulatory Visit: Payer: Self-pay | Admitting: Obstetrics and Gynecology

## 2017-02-03 DIAGNOSIS — N6489 Other specified disorders of breast: Secondary | ICD-10-CM

## 2017-02-06 ENCOUNTER — Ambulatory Visit
Admission: RE | Admit: 2017-02-06 | Discharge: 2017-02-06 | Disposition: A | Discharge status: Home / Self Care | Payer: BLUE CROSS/BLUE SHIELD | Source: Ambulatory Visit | Attending: Obstetrics and Gynecology | Admitting: Obstetrics and Gynecology

## 2017-02-06 ENCOUNTER — Other Ambulatory Visit: Payer: Self-pay | Admitting: Obstetrics and Gynecology

## 2017-02-06 DIAGNOSIS — N6310 Unspecified lump in the right breast, unspecified quadrant: Secondary | ICD-10-CM

## 2017-02-06 DIAGNOSIS — R922 Inconclusive mammogram: Secondary | ICD-10-CM | POA: Diagnosis not present

## 2017-02-06 DIAGNOSIS — N6489 Other specified disorders of breast: Secondary | ICD-10-CM | POA: Diagnosis not present

## 2017-02-09 ENCOUNTER — Ambulatory Visit
Admission: RE | Admit: 2017-02-09 | Discharge: 2017-02-09 | Disposition: A | Discharge status: Home / Self Care | Payer: BLUE CROSS/BLUE SHIELD | Source: Ambulatory Visit | Attending: Obstetrics and Gynecology | Admitting: Obstetrics and Gynecology

## 2017-02-09 ENCOUNTER — Other Ambulatory Visit: Payer: Self-pay | Admitting: General Surgery

## 2017-02-09 DIAGNOSIS — C50911 Malignant neoplasm of unspecified site of right female breast: Secondary | ICD-10-CM | POA: Diagnosis not present

## 2017-02-09 DIAGNOSIS — N6310 Unspecified lump in the right breast, unspecified quadrant: Secondary | ICD-10-CM

## 2017-02-09 DIAGNOSIS — N6314 Unspecified lump in the right breast, lower inner quadrant: Secondary | ICD-10-CM | POA: Diagnosis not present

## 2017-02-11 ENCOUNTER — Telehealth: Payer: Self-pay | Admitting: *Deleted

## 2017-02-11 NOTE — Telephone Encounter (Signed)
error 

## 2017-02-12 ENCOUNTER — Encounter: Payer: Self-pay | Admitting: *Deleted

## 2017-02-12 ENCOUNTER — Telehealth: Payer: Self-pay | Admitting: Oncology

## 2017-02-12 NOTE — Telephone Encounter (Signed)
Spoke with patient to confirm afternoon appointment for [facility] on 02/18/2017, paperwork will be emailed to patient

## 2017-02-17 ENCOUNTER — Encounter: Payer: Self-pay | Admitting: Oncology

## 2017-02-17 ENCOUNTER — Other Ambulatory Visit: Payer: Self-pay | Admitting: *Deleted

## 2017-02-17 DIAGNOSIS — C50311 Malignant neoplasm of lower-inner quadrant of right female breast: Secondary | ICD-10-CM | POA: Insufficient documentation

## 2017-02-17 DIAGNOSIS — Z171 Estrogen receptor negative status [ER-]: Principal | ICD-10-CM

## 2017-02-18 ENCOUNTER — Ambulatory Visit (HOSPITAL_BASED_OUTPATIENT_CLINIC_OR_DEPARTMENT_OTHER): Payer: BLUE CROSS/BLUE SHIELD | Admitting: Oncology

## 2017-02-18 ENCOUNTER — Encounter: Payer: Self-pay | Admitting: Oncology

## 2017-02-18 ENCOUNTER — Encounter: Payer: Self-pay | Admitting: Physical Therapy

## 2017-02-18 ENCOUNTER — Ambulatory Visit
Admission: RE | Admit: 2017-02-18 | Discharge: 2017-02-18 | Disposition: A | Discharge status: Home / Self Care | Payer: BLUE CROSS/BLUE SHIELD | Source: Ambulatory Visit | Attending: Radiation Oncology | Admitting: Radiation Oncology

## 2017-02-18 ENCOUNTER — Other Ambulatory Visit (HOSPITAL_BASED_OUTPATIENT_CLINIC_OR_DEPARTMENT_OTHER): Payer: BLUE CROSS/BLUE SHIELD

## 2017-02-18 ENCOUNTER — Ambulatory Visit: Payer: BLUE CROSS/BLUE SHIELD | Attending: General Surgery | Admitting: Physical Therapy

## 2017-02-18 ENCOUNTER — Encounter: Payer: Self-pay | Admitting: *Deleted

## 2017-02-18 ENCOUNTER — Other Ambulatory Visit: Payer: Self-pay | Admitting: *Deleted

## 2017-02-18 VITALS — BP 145/97 | HR 86 | Temp 98.4°F | Resp 18 | Ht 61.0 in | Wt 127.5 lb

## 2017-02-18 DIAGNOSIS — C50311 Malignant neoplasm of lower-inner quadrant of right female breast: Secondary | ICD-10-CM

## 2017-02-18 DIAGNOSIS — C50511 Malignant neoplasm of lower-outer quadrant of right female breast: Secondary | ICD-10-CM | POA: Diagnosis not present

## 2017-02-18 DIAGNOSIS — Z8042 Family history of malignant neoplasm of prostate: Secondary | ICD-10-CM

## 2017-02-18 DIAGNOSIS — Z90710 Acquired absence of both cervix and uterus: Secondary | ICD-10-CM | POA: Diagnosis not present

## 2017-02-18 DIAGNOSIS — Z171 Estrogen receptor negative status [ER-]: Principal | ICD-10-CM

## 2017-02-18 LAB — CBC WITH DIFFERENTIAL/PLATELET
BASO%: 0.3 % (ref 0.0–2.0)
BASOS ABS: 0 10*3/uL (ref 0.0–0.1)
EOS%: 1.5 % (ref 0.0–7.0)
Eosinophils Absolute: 0.1 10*3/uL (ref 0.0–0.5)
HCT: 43.3 % (ref 34.8–46.6)
HGB: 14.8 g/dL (ref 11.6–15.9)
LYMPH%: 29.9 % (ref 14.0–49.7)
MCH: 30.3 pg (ref 25.1–34.0)
MCHC: 34.2 g/dL (ref 31.5–36.0)
MCV: 88.5 fL (ref 79.5–101.0)
MONO#: 0.5 10*3/uL (ref 0.1–0.9)
MONO%: 6.6 % (ref 0.0–14.0)
NEUT#: 4.4 10*3/uL (ref 1.5–6.5)
NEUT%: 61.7 % (ref 38.4–76.8)
Platelets: 283 10*3/uL (ref 145–400)
RBC: 4.89 10*6/uL (ref 3.70–5.45)
RDW: 12.6 % (ref 11.2–14.5)
WBC: 7.2 10*3/uL (ref 3.9–10.3)
lymph#: 2.1 10*3/uL (ref 0.9–3.3)

## 2017-02-18 LAB — COMPREHENSIVE METABOLIC PANEL
ALBUMIN: 4.4 g/dL (ref 3.5–5.0)
ALK PHOS: 89 U/L (ref 40–150)
ALT: 18 U/L (ref 0–55)
ANION GAP: 9 meq/L (ref 3–11)
AST: 22 U/L (ref 5–34)
BILIRUBIN TOTAL: 0.48 mg/dL (ref 0.20–1.20)
BUN: 18.4 mg/dL (ref 7.0–26.0)
CO2: 27 mEq/L (ref 22–29)
Calcium: 10.3 mg/dL (ref 8.4–10.4)
Chloride: 106 mEq/L (ref 98–109)
Creatinine: 1 mg/dL (ref 0.6–1.1)
GLUCOSE: 95 mg/dL (ref 70–140)
Potassium: 4.6 mEq/L (ref 3.5–5.1)
Sodium: 142 mEq/L (ref 136–145)
TOTAL PROTEIN: 8.3 g/dL (ref 6.4–8.3)

## 2017-02-18 NOTE — Progress Notes (Signed)
Kelsey Henry  [phone] [phone]     ID: Kelsey Henry DOB: [birth year]  MR#: [id number]  PIR#:[id number]  Patient Care Team: Patient, No Pcp Per as PCP - General (General Practice) Magrinat, Virgie Dad, MD as Consulting Physician (Oncology) Kyung Rudd, MD as Consulting Physician (Radiation Oncology) Rolm Bookbinder, MD as Consulting Physician (General Surgery) Meisinger, Sherren Mocha, MD as Consulting Physician (Obstetrics and Gynecology) Loletta Specter, MD (Unknown Physician Specialty) Chauncey Cruel, MD OTHER MD:  CHIEF COMPLAINT: triple negative breast cancer  CURRENT TREATMENT: definitive surgery pending   HISTORY OF CURRENT ILLNESS: Kelsey Henry had routine screening mammography October 2018 showing a possible right breast mass.  She was scheduled for unilateral right mammography and tomography with right breast ultrasonography at the breast center February 06, 2017.  This showed the breast density to be category D.  In the lower inner quadrant of the right breast there was a 0.6 cm oval mass, which by ultrasound measured 0.9 cm and was irregular and hypoechoic.  Ultrasound of the right axilla was sonographically benign.  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  The patient's subsequent history is as detailed below.  INTERVAL HISTORY: Sutton was evaluated in the multidisciplinary breast cancer clinic 02/18/2017 accompanied by her husband Kelsey Henry. Her case was also presented at the multidisciplinary breast cancer conference on the same day. At that time a preliminary plan was proposed: Genetics testing, breast conserving surgery with sentinel lymph node sampling, port placement, adjuvant chemotherapy, and adjuvant  radiation.   REVIEW OF SYSTEMS: There were no specific symptoms leading to the original mammogram, which was routinely scheduled. The patient denies unusual headaches, visual changes, nausea, vomiting, stiff neck, dizziness, or gait imbalance. There has been no cough, phlegm production, or pleurisy, no chest pain or pressure, and no change in bowel or bladder habits. The patient denies fever, rash, bleeding, unexplained fatigue.  She has lost approximately 13 pounds over the past year which she ascribes to stress.  She notes her husband also lost weight over the same period of time. She exercises regularly usually going to the gym 3-4 times a week.  A detailed review of systems was otherwise entirely negative.   PAST MEDICAL HISTORY: Past Medical History:  Diagnosis Date  . GERD (gastroesophageal reflux disease)    no meds  . Seasonal allergies   . SVD (spontaneous vaginal delivery)    x 1    PAST SURGICAL HISTORY: Past Surgical History:  Procedure Laterality Date  . TUBAL LIGATION    . WISDOM TOOTH EXTRACTION      FAMILY HISTORY Family History  Problem Relation Age of Onset  . Prostate cancer Maternal Grandfather   The patient has no information regarding her father or his side of the family.  The patient's mother is 95 years old as of November 2018.  The patient has 3 brothers, 2 sisters.  One sister had 2 "benign tumors" removed from the right breast at the age of 42.  A maternal grandfather had prostate cancer in his 36s.  GYNECOLOGIC HISTORY:  Patient's last menstrual period was 05/26/2012. Menarche age 54, first live birth age 70, the patient is GX P1.  She stopped having periods 2012 when she underwent hysterectomy, without salpingo-oophorectomy..  She used hormone replacement for approximately 3  months.  She is used oral contraceptives remotely with no complications.  SOCIAL HISTORY:  Jasara works as an Psychologist, educational for [employer], in the paternal DNA subsection.  Her  husband Kelsey Henry is an Clinical biochemist.  There is some Kelsey Henry is an Producer, television/film/video and lives in [place].  The patient has no grandchildren.  She is a Psychologist, forensic.    ADVANCED DIRECTIVES: Not in place   HEALTH MAINTENANCE: Social History   Tobacco Use  . Smoking status: Never Smoker  . Smokeless tobacco: Never Used  Substance Use Topics  . Alcohol use: No  . Drug use: No     Colonoscopy: n/a  PAP: Status post hysterectomy  Bone density: n/a   Allergies  Allergen Reactions  . Aspirin Hives    No current outpatient medications on file.   No current facility-administered medications for this visit.     OBJECTIVE: Middle-aged African-American woman who appears well  Vitals:   02/18/17 1234  BP: (!) 145/97  Pulse: 86  Resp: 18  Temp: 98.4 F (36.9 C)  SpO2: 98%     Body mass index is 24.09 kg/m.   Wt Readings from Last 3 Encounters:  02/18/17 127 lb 8 oz (57.8 kg)  10/12/14 140 lb (63.5 kg)  06/23/12 137 lb (62.1 kg)      ECOG FS:0 - Asymptomatic  Ocular: Sclerae unicteric, pupils round and equal Ear-nose-throat: Oropharynx clear and moist Lymphatic: No cervical or supraclavicular adenopathy Lungs no rales or rhonchi Heart regular rate and rhythm Abd soft, nontender, positive bowel sounds MSK no focal spinal tenderness, no joint edema Neuro: non-focal, well-oriented, appropriate affect Breasts: The masses palpable in the lower inner quadrant of the right breast, measuring about a centimeter, easily movable, and not associated with any skin or nipple change.  Left breast is benign.  Both axillae are benign.  LAB RESULTS:  CMP     Component Value Date/Time   NA 142 02/18/2017 1221   K 4.6 02/18/2017 1221   CO2 27 02/18/2017 1221   GLUCOSE 95 02/18/2017 1221   BUN 18.4 02/18/2017 1221   CREATININE 1.0 02/18/2017 1221   CALCIUM 10.3 02/18/2017 1221   PROT 8.3 02/18/2017 1221   ALBUMIN 4.4 02/18/2017 1221   AST 22 02/18/2017 1221   ALT 18 02/18/2017 1221    ALKPHOS 89 02/18/2017 1221   BILITOT 0.48 02/18/2017 1221    No results found for: TOTALPROTELP, ALBUMINELP, A1GS, A2GS, BETS, BETA2SER, GAMS, MSPIKE, SPEI  No results found for: Nils Pyle, [facility]  Lab Results  Component Value Date   WBC 7.2 02/18/2017   NEUTROABS 4.4 02/18/2017   HGB 14.8 02/18/2017   HCT 43.3 02/18/2017   MCV 88.5 02/18/2017   PLT 283 02/18/2017      Chemistry      Component Value Date/Time   NA 142 02/18/2017 1221   K 4.6 02/18/2017 1221   CO2 27 02/18/2017 1221   BUN 18.4 02/18/2017 1221   CREATININE 1.0 02/18/2017 1221      Component Value Date/Time   CALCIUM 10.3 02/18/2017 1221   ALKPHOS 89 02/18/2017 1221   AST 22 02/18/2017 1221   ALT 18 02/18/2017 1221   BILITOT 0.48 02/18/2017 1221       No results found for: LABCA2  No components found for: OACZYS063  No results for input(s): INR in the last 168 hours.  No results found for: LABCA2  No results found for: KZS010  No results found for: XNA355  No results found  for: YBO175  No results found for: CA2729  No components found for: HGQUANT  No results found for: CEA1 / No results found for: CEA1   No results found for: AFPTUMOR  No results found for: CHROMOGRNA  No results found for: PSA1  Appointment on 02/18/2017  Component Date Value Ref Range Status  . Sodium 02/18/2017 142  136 - 145 mEq/L Final  . Potassium 02/18/2017 4.6  3.5 - 5.1 mEq/L Final  . Chloride 02/18/2017 106  98 - 109 mEq/L Final  . CO2 02/18/2017 27  22 - 29 mEq/L Final  . Glucose 02/18/2017 95  70 - 140 mg/dl Final   Glucose reference range is for nonfasting patients. Fasting glucose reference range is 70- 100.  Marland Kitchen BUN 02/18/2017 18.4  7.0 - 26.0 mg/dL Final  . Creatinine 02/18/2017 1.0  0.6 - 1.1 mg/dL Final  . Total Bilirubin 02/18/2017 0.48  0.20 - 1.20 mg/dL Final  . Alkaline Phosphatase 02/18/2017 89  40 - 150 U/L Final  . AST 02/18/2017 22  5 - 34 U/L Final  . ALT 02/18/2017  18  0 - 55 U/L Final  . Total Protein 02/18/2017 8.3  6.4 - 8.3 g/dL Final  . Albumin 02/18/2017 4.4  3.5 - 5.0 g/dL Final  . Calcium 02/18/2017 10.3  8.4 - 10.4 mg/dL Final  . Anion Gap 02/18/2017 9  3 - 11 mEq/L Final  . EGFR 02/18/2017 >60  >60 ml/min/1.73 m2 Final   eGFR is calculated using the CKD-EPI Creatinine Equation (2009)  . WBC 02/18/2017 7.2  3.9 - 10.3 10e3/uL Final  . NEUT# 02/18/2017 4.4  1.5 - 6.5 10e3/uL Final  . HGB 02/18/2017 14.8  11.6 - 15.9 g/dL Final  . HCT 02/18/2017 43.3  34.8 - 46.6 % Final  . Platelets 02/18/2017 283  145 - 400 10e3/uL Final  . MCV 02/18/2017 88.5  79.5 - 101.0 fL Final  . MCH 02/18/2017 30.3  25.1 - 34.0 pg Final  . MCHC 02/18/2017 34.2  31.5 - 36.0 g/dL Final  . RBC 02/18/2017 4.89  3.70 - 5.45 10e6/uL Final  . RDW 02/18/2017 12.6  11.2 - 14.5 % Final  . lymph# 02/18/2017 2.1  0.9 - 3.3 10e3/uL Final  . MONO# 02/18/2017 0.5  0.1 - 0.9 10e3/uL Final  . Eosinophils Absolute 02/18/2017 0.1  0.0 - 0.5 10e3/uL Final  . Basophils Absolute 02/18/2017 0.0  0.0 - 0.1 10e3/uL Final  . NEUT% 02/18/2017 61.7  38.4 - 76.8 % Final  . LYMPH% 02/18/2017 29.9  14.0 - 49.7 % Final  . MONO% 02/18/2017 6.6  0.0 - 14.0 % Final  . EOS% 02/18/2017 1.5  0.0 - 7.0 % Final  . BASO% 02/18/2017 0.3  0.0 - 2.0 % Final    (this displays the last labs from the last 3 days)  No results found for: TOTALPROTELP, ALBUMINELP, A1GS, A2GS, BETS, BETA2SER, GAMS, MSPIKE, SPEI (this displays SPEP labs)  No results found for: KPAFRELGTCHN, LAMBDASER, KAPLAMBRATIO (kappa/lambda light chains)  No results found for: HGBA, HGBA2QUANT, HGBFQUANT, HGBSQUAN (Hemoglobinopathy evaluation)   No results found for: LDH  No results found for: IRON, TIBC, IRONPCTSAT (Iron and TIBC)  No results found for: FERRITIN  Urinalysis No results found for: COLORURINE, APPEARANCEUR, LABSPEC, PHURINE, GLUCOSEU, HGBUR, BILIRUBINUR, KETONESUR, PROTEINUR, UROBILINOGEN, NITRITE,  LEUKOCYTESUR   STUDIES: US Breast Ltd Uni Right Inc Axilla  Result Date: 02/06/2017 CLINICAL DATA:  Screening recall for a possible right breast mass. EXAM: 2D DIGITAL DIAGNOSTIC UNILATERAL  RIGHT MAMMOGRAM WITH CAD AND ADJUNCT TOMO RIGHT BREAST ULTRASOUND COMPARISON:  Previous exam(s). ACR Breast Density Category d: The breast tissue is extremely dense, which lowers the sensitivity of mammography. FINDINGS: In the lower-inner quadrant of the right breast, posterior depth there is a 6 mm oval mass with indistinct margins. Mammographic images were processed with CAD. Ultrasound targeted to the right breast at 5 o'clock, 2 cm from the nipple demonstrates a small irregular hypoechoic mass with angular and indistinct margins. There appears to be blood flow within the mass on color Doppler imaging. The mass measures 9 x 6 x 7 mm. Ultrasound of the right axilla demonstrates multiple normal-appearing lymph nodes. IMPRESSION: 1. There is an indeterminate right breast mass at 5 o'clock measuring 9 mm. 2.  No evidence of right axillary lymphadenopathy. RECOMMENDATION: Ultrasound-guided biopsy is recommended for the right breast mass. This has been scheduled for 02/09/2017 at 7:30 a.m. I have discussed the findings and recommendations with the patient. Results were also provided in writing at the conclusion of the visit. If applicable, a reminder letter will be sent to the patient regarding the next appointment. BI-RADS CATEGORY  4: Suspicious. Electronically Signed   By: Ammie Ferrier M.D.   On: 02/06/2017 12:58   Mm Diag Breast Tomo Uni Right  Result Date: 02/06/2017 CLINICAL DATA:  Screening recall for a possible right breast mass. EXAM: 2D DIGITAL DIAGNOSTIC UNILATERAL RIGHT MAMMOGRAM WITH CAD AND ADJUNCT TOMO RIGHT BREAST ULTRASOUND COMPARISON:  Previous exam(s). ACR Breast Density Category d: The breast tissue is extremely dense, which lowers the sensitivity of mammography. FINDINGS: In the lower-inner  quadrant of the right breast, posterior depth there is a 6 mm oval mass with indistinct margins. Mammographic images were processed with CAD. Ultrasound targeted to the right breast at 5 o'clock, 2 cm from the nipple demonstrates a small irregular hypoechoic mass with angular and indistinct margins. There appears to be blood flow within the mass on color Doppler imaging. The mass measures 9 x 6 x 7 mm. Ultrasound of the right axilla demonstrates multiple normal-appearing lymph nodes. IMPRESSION: 1. There is an indeterminate right breast mass at 5 o'clock measuring 9 mm. 2.  No evidence of right axillary lymphadenopathy. RECOMMENDATION: Ultrasound-guided biopsy is recommended for the right breast mass. This has been scheduled for 02/09/2017 at 7:30 a.m. I have discussed the findings and recommendations with the patient. Results were also provided in writing at the conclusion of the visit. If applicable, a reminder letter will be sent to the patient regarding the next appointment. BI-RADS CATEGORY  4: Suspicious. Electronically Signed   By: Ammie Ferrier M.D.   On: 02/06/2017 12:58   Mm Clip Placement Right  Result Date: 02/09/2017 CLINICAL DATA:  Evaluate biopsy clips EXAM: DIAGNOSTIC RIGHT MAMMOGRAM POST ULTRASOUND BIOPSY COMPARISON:  Previous exam(s). FINDINGS: Mammographic images were obtained following ultrasound guided biopsy of a right breast mass. The ribbon shaped biopsy clip is in good position. IMPRESSION: The ribbon shaped biopsy clip is in good position. Final Assessment: Post Procedure Mammograms for Marker Placement Electronically Signed   By: Dorise Bullion III M.D   On: 02/09/2017 08:43   [place] Rt Breast Bx W Loc Dev 1st Lesion Img Bx Spec US Guide  Addendum Date: 02/11/2017   ADDENDUM REPORT: 02/11/2017 11:37 ADDENDUM: Pathology revealed GRADE III INVASIVE MAMMARY CARCINOMA of the Right breast, 5:00. This was found to be concordant by Dr. Dorise Bullion. Pathology results were discussed  with the patient by telephone. The patient reported  doing well after the biopsy with tenderness at the site. Post biopsy instructions and care were reviewed and questions were answered. The patient was encouraged to call [facility] for any additional concerns. The patient was referred to [facility] on February 18, 2017. Pathology results reported by Terie Purser, RN on 02/11/2017. Electronically Signed   By: Dorise Bullion III M.D   On: 02/11/2017 11:37   Result Date: 02/11/2017 CLINICAL DATA:  Right breast mass EXAM: ULTRASOUND GUIDED RIGHT BREAST CORE NEEDLE BIOPSY COMPARISON:  Previous exam(s). FINDINGS: I met with the patient and we discussed the procedure of ultrasound-guided biopsy, including benefits and alternatives. We discussed the high likelihood of a successful procedure. We discussed the risks of the procedure, including infection, bleeding, tissue injury, clip migration, and inadequate sampling. Informed written consent was given. The usual time-out protocol was performed immediately prior to the procedure. Lesion quadrant: Lower-inner Using sterile technique and 1% Lidocaine as local anesthetic, under direct ultrasound visualization, a 12 gauge spring-loaded device was used to perform biopsy of a right breast mass using a lateral approach. At the conclusion of the procedure a ribbon shape tissue marker clip was deployed into the biopsy cavity. Follow up 2 view mammogram was performed and dictated separately. IMPRESSION: Ultrasound guided biopsy of a right breast mass. No apparent complications. Electronically Signed: By: Dorise Bullion III M.D On: 02/09/2017 08:36    ELIGIBLE FOR AVAILABLE RESEARCH PROTOCOL: UPBEAT; FI433  ASSESSMENT: 51 y.o. [place] woman status post right breast lower inner quadrant biopsy February 09, 2017 for a clinical T1b N0, stage IB invasive ductal carcinoma,  grade 3, triple negative  (1) breast conserving surgery with sentinel lymph node sampling planned  (2) adjuvant chemotherapy consisting of cyclophosphamide and doxorubicin in dose dense fashion x4 followed by weekly paclitaxel x12 +/- carboplatin as per IR518  (3) adjuvant radiation to follow  (4) genetics testing pending  PLAN: We spent the Henry part of today's hour-long appointment discussing the biology of her diagnosis and the specifics of her situation. We first reviewed the fact that cancer is not one disease but more than 100 different diseases and that it is important to keep them separate-- otherwise when friends and relatives discuss their own cancer experiences with Tanicia confusion can result. Similarly we explained that if breast cancer spreads to the bone or liver, the patient would not have bone cancer or liver cancer, but breast cancer in the bone and breast cancer in the liver: one cancer in three places-- not 3 different cancers which otherwise would have to be treated in 3 different ways.  We discussed the difference between local and systemic therapy. In terms of loco-regional treatment, lumpectomy plus radiation is equivalent to mastectomy as far as survival is concerned. For this reason, and because the cosmetic results are generally superior, we recommend breast conserving surgery.   We then discussed the rationale for systemic therapy. There is some risk that this cancer may have already spread to other parts of her body. Patients frequently ask at this point about bone scans, CAT scans and PET scans to find out if they have occult breast cancer somewhere else. The problem is that in early stage disease we are much more likely to find false positives then true cancers and this would expose the patient to unnecessary procedures as well as unnecessary radiation. Scans cannot answer the question the patient really would like to  know, which is whether she has microscopic disease  elsewhere in her body. For those reasons we do not recommend them.  Of course we would proceed to aggressive evaluation of any symptoms that might suggest metastatic disease, but that is not the case here.  Next we went over the options for systemic therapy which are anti-estrogens, anti-HER-2 immunotherapy, and chemotherapy. Manvir does not meet criteria for anti-HER-2 immunotherapy or anti-estrogens.  Her only option for systemic therapy is chemotherapy, which is accordingly what we recommend.  Specifically we are considering cyclophosphamide and doxorubicin in dose dense fashion x4 followed by paclitaxel and carboplatin weekly x12.  Today we discussed the possible toxicities, side effects and complications of these agents and she will also come to chemotherapy school for more information.  I plan to see her shortly after her surgery and we hope to start chemotherapy March 17, 2017  Mella has a good understanding of the overall plan. She agrees with it. She knows the goal of treatment in her case is cure. She will call with any problems that may develop before her next visit here.  Chauncey Cruel, MD   02/18/2017 5:25 PM Medical Oncology and Hematology [facility] [address] [place], [place] [address] Tel. [phone]    Fax. [phone]

## 2017-02-18 NOTE — Progress Notes (Signed)
Radiation Oncology         [phone] ________________________________  Name: Kelsey Henry        MRN: [id number]  Date of Service: 02/18/2017 DOB: Feb 28, 1966  [id number], No Pcp Per  Rolm Bookbinder, MD     REFERRING PHYSICIAN: Rolm Bookbinder, MD   DIAGNOSIS: The encounter diagnosis was Malignant neoplasm of lower-inner quadrant of right breast of female, estrogen receptor negative ([place]).   HISTORY OF PRESENT ILLNESS: Kelsey Henry is a 51 y.o. female seen in the multidisciplinary breast clinic for a new diagnosis of right breast cancer. The patient was noted to have screening assymmetry of the right breast. Diagnostic imaging was performed and revealed a 9 x 7 x 6 mm lesion at 5:00. Her axilla was negative by ultrasound for any adenopathy. She underwent a biopsy on 02/09/17 which revealed a grade 3, triple negative invasive ductal carcinoma. She comes today to discuss options of treatment for her cancer.  PREVIOUS RADIATION THERAPY: No   PAST MEDICAL HISTORY:  Past Medical History:  Diagnosis Date  . GERD (gastroesophageal reflux disease)    no meds  . Seasonal allergies   . SVD (spontaneous vaginal delivery)    x 1       PAST SURGICAL HISTORY: Past Surgical History:  Procedure Laterality Date  . TUBAL LIGATION    . WISDOM TOOTH EXTRACTION       FAMILY HISTORY:  Family History  Problem Relation Age of Onset  . Prostate cancer Maternal Grandfather      SOCIAL HISTORY:  reports that  has never smoked. she has never used smokeless tobacco. She reports that she does not drink alcohol or use drugs. The patient is married and lives in [place]. She is an account specialist at [employer]   MEDICATIONS:  No current outpatient medications on file.   No current facility-administered medications for this encounter.      REVIEW OF SYSTEMS: On review of systems, the patient reports that she is doing well overall. She denies any  chest pain, shortness of breath, cough, fevers, chills, night sweats. She has lost about 13 pounds unintentionally over the last month. She denies any bowel or bladder disturbances, and denies abdominal pain, nausea or vomiting. She denies any new musculoskeletal or joint aches or pains. A complete review of systems is obtained and is otherwise negative.     PHYSICAL EXAM:  Wt Readings from Last 3 Encounters:  02/18/17 127 lb 8 oz (57.8 kg)  10/12/14 140 lb (63.5 kg)  06/23/12 137 lb (62.1 kg)   Temp Readings from Last 3 Encounters:  02/18/17 98.4 F (36.9 C) (Oral)  11/01/14 97.4 F (36.3 C) (Oral)  06/23/12 98.2 F (36.8 C) (Oral)   BP Readings from Last 3 Encounters:  02/18/17 (!) 145/97  11/01/14 (!) 151/93  06/23/12 121/76   Pulse Readings from Last 3 Encounters:  02/18/17 86  11/01/14 66  06/23/12 80     In general this is a well appearing African American  female in no acute distress. She is alert and oriented x4 and appropriate throughout the examination. HEENT reveals that the patient is normocephalic, atraumatic. EOMs are intact. PERRLA. Skin is intact without any evidence of gross lesions. Cardiovascular exam reveals a regular rate and rhythm, no clicks rubs or murmurs are auscultated. Chest is clear to auscultation bilaterally. Lymphatic assessment is performed and does not reveal any adenopathy in the cervical, supraclavicular, axillary, or inguinal chains. Bilateral breast  exam is performed and reveals a small area of fullness deep to her biopsy site on the right. No additional mass is noted, and no mass is palpated in the left breast. No nipple bleeding or discharge is noted.  Abdomen has active bowel sounds in all quadrants and is intact. The abdomen is soft, non tender, non distended. Lower extremities are negative for pretibial pitting edema, deep calf tenderness, cyanosis or clubbing.   ECOG = 0  0 - Asymptomatic (Fully active, able to carry on all predisease  activities without restriction)  1 - Symptomatic but completely ambulatory (Restricted in physically strenuous activity but ambulatory and able to carry out work of a light or sedentary nature. For example, light housework, office work)  2 - Symptomatic, <50% in bed during the day (Ambulatory and capable of all self care but unable to carry out any work activities. Up and about more than 50% of waking hours)  3 - Symptomatic, >50% in bed, but not bedbound (Capable of only limited self-care, confined to bed or chair 50% or more of waking hours)  4 - Bedbound (Completely disabled. Cannot carry on any self-care. Totally confined to bed or chair)  5 - Death   Eustace Pen MM, Creech RH, Tormey DC, et al. [phone]). "Toxicity and response criteria of the [facility] Group". [place] Oncol. 5 (6): 649-55    LABORATORY DATA:  Lab Results  Component Value Date   WBC 7.2 02/18/2017   HGB 14.8 02/18/2017   HCT 43.3 02/18/2017   MCV 88.5 02/18/2017   PLT 283 02/18/2017   Lab Results  Component Value Date   NA 142 02/18/2017   K 4.6 02/18/2017   CO2 27 02/18/2017   Lab Results  Component Value Date   ALT 18 02/18/2017   AST 22 02/18/2017   ALKPHOS 89 02/18/2017   BILITOT 0.48 02/18/2017      RADIOGRAPHY: US Breast Ltd Uni Right Inc Axilla  Result Date: 02/06/2017 CLINICAL DATA:  Screening recall for a possible right breast mass. EXAM: 2D DIGITAL DIAGNOSTIC UNILATERAL RIGHT MAMMOGRAM WITH CAD AND ADJUNCT TOMO RIGHT BREAST ULTRASOUND COMPARISON:  Previous exam(s). ACR Breast Density Category d: The breast tissue is extremely dense, which lowers the sensitivity of mammography. FINDINGS: In the lower-inner quadrant of the right breast, posterior depth there is a 6 mm oval mass with indistinct margins. Mammographic images were processed with CAD. Ultrasound targeted to the right breast at 5 o'clock, 2 cm from the nipple demonstrates a small irregular hypoechoic mass with angular  and indistinct margins. There appears to be blood flow within the mass on color Doppler imaging. The mass measures 9 x 6 x 7 mm. Ultrasound of the right axilla demonstrates multiple normal-appearing lymph nodes. IMPRESSION: 1. There is an indeterminate right breast mass at 5 o'clock measuring 9 mm. 2.  No evidence of right axillary lymphadenopathy. RECOMMENDATION: Ultrasound-guided biopsy is recommended for the right breast mass. This has been scheduled for 02/09/2017 at 7:30 a.m. I have discussed the findings and recommendations with the patient. Results were also provided in writing at the conclusion of the visit. If applicable, a reminder letter will be sent to the patient regarding the next appointment. BI-RADS CATEGORY  4: Suspicious. Electronically Signed   By: Ammie Ferrier M.D.   On: 02/06/2017 12:58   Mm Diag Breast Tomo Uni Right  Result Date: 02/06/2017 CLINICAL DATA:  Screening recall for a possible right breast mass. EXAM: 2D DIGITAL DIAGNOSTIC UNILATERAL RIGHT MAMMOGRAM  WITH CAD AND ADJUNCT TOMO RIGHT BREAST ULTRASOUND COMPARISON:  Previous exam(s). ACR Breast Density Category d: The breast tissue is extremely dense, which lowers the sensitivity of mammography. FINDINGS: In the lower-inner quadrant of the right breast, posterior depth there is a 6 mm oval mass with indistinct margins. Mammographic images were processed with CAD. Ultrasound targeted to the right breast at 5 o'clock, 2 cm from the nipple demonstrates a small irregular hypoechoic mass with angular and indistinct margins. There appears to be blood flow within the mass on color Doppler imaging. The mass measures 9 x 6 x 7 mm. Ultrasound of the right axilla demonstrates multiple normal-appearing lymph nodes. IMPRESSION: 1. There is an indeterminate right breast mass at 5 o'clock measuring 9 mm. 2.  No evidence of right axillary lymphadenopathy. RECOMMENDATION: Ultrasound-guided biopsy is recommended for the right breast mass. This  has been scheduled for 02/09/2017 at 7:30 a.m. I have discussed the findings and recommendations with the patient. Results were also provided in writing at the conclusion of the visit. If applicable, a reminder letter will be sent to the patient regarding the next appointment. BI-RADS CATEGORY  4: Suspicious. Electronically Signed   By: Ammie Ferrier M.D.   On: 02/06/2017 12:58   Mm Clip Placement Right  Result Date: 02/09/2017 CLINICAL DATA:  Evaluate biopsy clips EXAM: DIAGNOSTIC RIGHT MAMMOGRAM POST ULTRASOUND BIOPSY COMPARISON:  Previous exam(s). FINDINGS: Mammographic images were obtained following ultrasound guided biopsy of a right breast mass. The ribbon shaped biopsy clip is in good position. IMPRESSION: The ribbon shaped biopsy clip is in good position. Final Assessment: Post Procedure Mammograms for Marker Placement Electronically Signed   By: Dorise Bullion III M.D   On: 02/09/2017 08:43   [place] Rt Breast Bx W Loc Dev 1st Lesion Img Bx Spec US Guide  Addendum Date: 02/11/2017   ADDENDUM REPORT: 02/11/2017 11:37 ADDENDUM: Pathology revealed GRADE III INVASIVE MAMMARY CARCINOMA of the Right breast, 5:00. This was found to be concordant by Dr. Dorise Bullion. Pathology results were discussed with the patient by telephone. The patient reported doing well after the biopsy with tenderness at the site. Post biopsy instructions and care were reviewed and questions were answered. The patient was encouraged to call [facility] for any additional concerns. The patient was referred to [facility] on February 18, 2017. Pathology results reported by Terie Purser, RN on 02/11/2017. Electronically Signed   By: Dorise Bullion III M.D   On: 02/11/2017 11:37   Result Date: 02/11/2017 CLINICAL DATA:  Right breast mass EXAM: ULTRASOUND GUIDED RIGHT BREAST CORE NEEDLE BIOPSY COMPARISON:  Previous exam(s).  FINDINGS: I met with the patient and we discussed the procedure of ultrasound-guided biopsy, including benefits and alternatives. We discussed the high likelihood of a successful procedure. We discussed the risks of the procedure, including infection, bleeding, tissue injury, clip migration, and inadequate sampling. Informed written consent was given. The usual time-out protocol was performed immediately prior to the procedure. Lesion quadrant: Lower-inner Using sterile technique and 1% Lidocaine as local anesthetic, under direct ultrasound visualization, a 12 gauge spring-loaded device was used to perform biopsy of a right breast mass using a lateral approach. At the conclusion of the procedure a ribbon shape tissue marker clip was deployed into the biopsy cavity. Follow up 2 view mammogram was performed and dictated separately. IMPRESSION: Ultrasound guided biopsy of a right breast mass. No apparent complications. Electronically Signed: By:  Dorise Bullion III M.D On: 02/09/2017 08:36       IMPRESSION/PLAN: 1. 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 Dr. Lisbeth Renshaw discusses the pathology findings and reviews the nature of triple negative breast disease. The consensus from the breast conference included referral to genetics with MRI of the breast. Her course would be followed by breast conservation with lumpectomy with and sentinel mapping. Following surgery, she would proceed with chemotherapy. We would plan to see her back about 4 weeks after completing chemotherapy and her course would then be followed by external radiotherapy to the breast. We discussed the risks, benefits, short, and long term effects of radiotherapy, and the patient is interested in proceeding. Dr. Lisbeth Renshaw discusses the delivery and logistics of radiotherapy and anticipates a course of 6 1/2 weeks.  2. Possible genetic predisposition to malignancy. The patient will be referred for genetic  counseling and we will follow up with these results when available. 3. Weight loss. She will proceed with further work up and Dr. Jana Hakim will keep this in mind while he works her up for her breast cancer.  The above documentation reflects my direct findings during this shared patient visit. Please see the separate note by Dr. Lisbeth Renshaw on this date for the remainder of the patient's plan of care.    Carola Rhine, PAC

## 2017-02-18 NOTE — Therapy (Signed)
[place], [place], [address] Phone: [phone]   Fax:  [phone]  Physical Therapy Evaluation  Patient Details  Name: Kelsey Henry MRN: [id number] Date of Birth: 1965/11/19 Referring Provider: Dr. Rolm Bookbinder   Encounter Date: 02/18/2017  PT End of Session - 02/18/17 2102    Visit Number  1    Number of Visits  2    Date for PT Re-Evaluation  04/15/17    PT Start Time  1310    PT Stop Time  1332 Also saw pt from 1359-1408 for a total of 31 minutes   Also saw pt from 1359-1408 for a total of 31 minutes   PT Time Calculation (min)  22 min    Activity Tolerance  Patient tolerated treatment well    Behavior During Therapy  [facility] for tasks assessed/performed       Past Medical History:  Diagnosis Date  . GERD (gastroesophageal reflux disease)    no meds  . Seasonal allergies   . SVD (spontaneous vaginal delivery)    x 1    Past Surgical History:  Procedure Laterality Date  . TUBAL LIGATION    . WISDOM TOOTH EXTRACTION      There were no vitals filed for this visit.   Subjective Assessment - 02/18/17 2055    Subjective  Patient reports she is here today to be seen by her medical team for her newly diagnosed right breast cancer.    Patient is accompained by:  Family member    Pertinent History  Patient was diagnosed on 01/27/17 with right grade 3 Triple negative invasive ductal carcinoma breast cancer. It measures 9 mm and is located in the lower inner quadrant. She reports that she has lost 13 pounds in the past month for unknown reasons but has no other health problems.    Patient Stated Goals  Reduce lymphedema risk and learn post op shoulder ROM HEP    Currently in Pain?  No/denies         [facility] PT Assessment - 02/18/17 0001      Assessment   Medical Diagnosis  Right breast cancer    Referring Provider  Dr. Rolm Bookbinder    Onset Date/Surgical Date  01/27/17    Hand  Dominance  Right    Prior Therapy  none      Precautions   Precautions  Other (comment)    Precaution Comments  active cancer      Restrictions   Weight Bearing Restrictions  No      Balance Screen   Has the patient fallen in the past 6 months  No    Has the patient had a decrease in activity level because of a fear of falling?   No    Is the patient reluctant to leave their home because of a fear of falling?   No      Home Environment   Living Environment  Private residence    Living Arrangements  Spouse/significant other    Available Help at Discharge  Family      Prior Function   Level of Independence  Independent    Vocation  Full time employment    Forensic scientist at [employer]  Does cardio, yoga, walks, and lifts weights daily      Cognition   Overall Cognitive Status  Within Functional Limits for tasks assessed      Posture/Postural Control  Posture/Postural Control  No significant limitations      ROM / Strength   AROM / PROM / Strength  AROM;Strength      AROM   AROM Assessment Site  Shoulder;Cervical    Right/Left Shoulder  Right;Left    Right Shoulder Extension  39 Degrees    Right Shoulder Flexion  150 Degrees    Right Shoulder ABduction  161 Degrees    Right Shoulder Internal Rotation  55 Degrees    Right Shoulder External Rotation  76 Degrees    Left Shoulder Extension  46 Degrees    Left Shoulder Flexion  153 Degrees    Left Shoulder ABduction  175 Degrees    Left Shoulder Internal Rotation  65 Degrees    Left Shoulder External Rotation  83 Degrees    Cervical Flexion  WNL    Cervical Extension  WNL    Cervical - Right Side Bend  WNL    Cervical - Left Side Bend  WNL    Cervical - Right Rotation  WNL    Cervical - Left Rotation  WNL      Strength   Overall Strength  Within functional limits for tasks performed        LYMPHEDEMA/ONCOLOGY QUESTIONNAIRE - 02/18/17 2100      Type   Cancer Type  Right breast  cancer      Lymphedema Assessments   Lymphedema Assessments  Upper extremities      Right Upper Extremity Lymphedema   10 cm Proximal to Olecranon Process  26.9 cm    Olecranon Process  23.7 cm    10 cm Proximal to Ulnar Styloid Process  21.2 cm    Just Proximal to Ulnar Styloid Process  14 cm    Across Hand at [employer]  17.9 cm    At [place] of 2nd Digit  5.6 cm      Left Upper Extremity Lymphedema   10 cm Proximal to Olecranon Process  26.2 cm    Olecranon Process  23 cm    10 cm Proximal to Ulnar Styloid Process  19.6 cm    Just Proximal to Ulnar Styloid Process  13.6 cm    Across Hand at [employer]  18.3 cm    At [place] of 2nd Digit  5.5 cm          Objective measurements completed on examination: See above findings.          Patient was instructed today in a home exercise program today for post op shoulder range of motion. These included active assist shoulder flexion in sitting, scapular retraction, wall walking with shoulder abduction, and hands behind head external rotation.  She was encouraged to do these twice a day, holding 3 seconds and repeating 5 times when permitted by her physician.        PT Education - 02/18/17 2101    Education provided  Yes    Education Details  Lymphedema risk reduction and post op shoulder ROM HEP    Person(s) Educated  Patient;Spouse    Methods  Explanation;Demonstration;Handout    Comprehension  Returned demonstration;Verbalized understanding           Breast Clinic Goals - 02/18/17 2110      Patient will be able to verbalize understanding of pertinent lymphedema risk reduction practices relevant to her diagnosis specifically related to skin care.   Time  1    Period  Days    Status  Achieved  Patient will be able to return demonstrate and/or verbalize understanding of the post-op home exercise program related to regaining shoulder range of motion.   Time  1    Period  Days    Status  Achieved       Patient will be able to verbalize understanding of the importance of attending the postoperative After Breast Cancer Class for further lymphedema risk reduction education and therapeutic exercise.   Time  1    Period  Days    Status  Achieved            Plan - 02/18/17 2103    Clinical Impression Statement  Patient was diagnosed on 01/27/17 with right grade 3 Triple negative invasive ductal carcinoma breast cancer. It measures 9 mm and is located in the lower inner quadrant. She reports that she has lost 13 pounds in the past month for unknown reasons but has no other health problems. Her multidisciplinary medical team met prior to her assessments to determine a recommended treatment plan. She is planning to have a right lumpectomy and sentinel node biopsy followed by chemotherapy and radiation. She will benefit from a PT visit post operatively to be reassessed and determine PT needs at that time.    History and Personal Factors relevant to plan of care:  None    Clinical Presentation  Stable    Clinical Decision Making  Low    Rehab Potential  Excellent    Clinical Impairments Affecting Rehab Potential  none    PT Frequency  -- Eval and 1 f/u visit 3-4 weeks post op   Eval and 1 f/u visit 3-4 weeks post op   PT Treatment/Interventions  ADLs/Self Care Home Management;Therapeutic exercise;Patient/family education    PT Next Visit Plan  Will f/u 3-4 weeks post op to determine PT needs    PT Home Exercise Plan  Post op shoulder ROM HEP       Patient will benefit from skilled therapeutic intervention in order to improve the following deficits and impairments:  Pain, Impaired UE functional use, Decreased knowledge of precautions, Decreased range of motion  Visit Diagnosis: Malignant neoplasm of lower-inner quadrant of right breast of female, estrogen receptor negative ([place]) - Plan: PT plan of care cert/re-cert   Patient will follow up at outpatient cancer rehab 3-4 weeks following surgery.   If the patient requires physical therapy at that time, a specific plan will be dictated and sent to the referring physician for approval. The patient was educated today on appropriate basic range of motion exercises to begin post operatively and the importance of attending the After Breast Cancer class following surgery.  Patient was educated today on lymphedema risk reduction practices as it pertains to recommendations that will benefit the patient immediately following surgery.  She verbalized good understanding.      Problem List Patient Active Problem List   Diagnosis Date Noted  . Malignant neoplasm of lower-inner quadrant of right breast of female, estrogen receptor negative ([place]) 02/17/2017  . Vulvar lesion 11/01/2014  . Menorrhagia 06/23/2012  . Dysmenorrhea 06/23/2012   Annia Friendly, PT 02/18/17 9:12 PM  [place], [place], [address] Phone: [phone]   Fax:  [phone]  Name: Kelsey Henry MRN: [id number] Date of Birth: [birth year]

## 2017-02-18 NOTE — Progress Notes (Signed)
Nutrition Assessment  Reason for Assessment:  Pt seen in Breast Clinic  ASSESSMENT:   51 year old female with new diagnosis of breast cancer.  Past medical history reviewed  Patient reports decreased appetite since new diagnosis of breast cancer  Medications:  reviewed  Labs: reviewed  Anthropometrics:   Height: 61 inches Weight: 127 lb BMI: 24   NUTRITION DIAGNOSIS: Food and nutrition related knowledge deficit related to new diagnosis of breast cancer as evidenced by no prior need for nutrition related information.  INTERVENTION:   Discussed and provided packet of information regarding nutritional tips for breast cancer patients.  Questions answered.  Teachback method used.  Contact information provided and patient knows to contact me with questions/concerns.    MONITORING, EVALUATION, and GOAL: Pt will consume a healthy plant based diet to maintain lean body mass throughout treatment.   Yashar Inclan B. Zenia Resides, [place], [place] Registered Dietitian [phone] (pager)

## 2017-02-18 NOTE — Progress Notes (Addendum)
START ON PATHWAY REGIMEN - Breast  Clinical Trial: Trial: [id number]  Patient Characteristics: Postoperative without Neoadjuvant Therapy (Pathologic Staging), Invasive Disease, Adjuvant Therapy, HER2 Negative/Unknown/Equivocal, ER Negative/Unknown, Node Negative, pT1a-c, pN0/N1mi or pT2 or Higher, pN0 Therapeutic Status: Postoperative without Neoadjuvant Therapy (Pathologic Staging) AJCC Grade: G3 AJCC N Category: pN0 AJCC M Category: cM0 ER Status: Negative (-) AJCC 8 Stage Grouping: IB HER2 Status: Negative (-) Oncotype Dx Recurrence Score: Not Appropriate AJCC T Category: pT1b PR Status: Negative (-) Intent of Therapy: Curative Intent, Discussed with Patient 

## 2017-02-18 NOTE — Patient Instructions (Signed)

## 2017-02-19 ENCOUNTER — Ambulatory Visit (HOSPITAL_BASED_OUTPATIENT_CLINIC_OR_DEPARTMENT_OTHER): Payer: BLUE CROSS/BLUE SHIELD | Admitting: Genetic Counselor

## 2017-02-19 ENCOUNTER — Encounter: Payer: Self-pay | Admitting: Genetic Counselor

## 2017-02-19 ENCOUNTER — Encounter: Payer: Self-pay | Admitting: Medical Oncology

## 2017-02-19 DIAGNOSIS — Z315 Encounter for genetic counseling: Secondary | ICD-10-CM

## 2017-02-19 DIAGNOSIS — C50919 Malignant neoplasm of unspecified site of unspecified female breast: Secondary | ICD-10-CM

## 2017-02-19 DIAGNOSIS — C50311 Malignant neoplasm of lower-inner quadrant of right female breast: Secondary | ICD-10-CM

## 2017-02-19 DIAGNOSIS — Z171 Estrogen receptor negative status [ER-]: Secondary | ICD-10-CM

## 2017-02-19 DIAGNOSIS — Z1379 Encounter for other screening for genetic and chromosomal anomalies: Secondary | ICD-10-CM

## 2017-02-19 HISTORY — DX: Encounter for other screening for genetic and chromosomal anomalies: Z13.79

## 2017-02-19 NOTE — Progress Notes (Signed)
[facility]      Initial Visit   Patient Name: Kelsey Henry Patient DOB: 1965-08-02 Patient Age: 51 y.o. Encounter Date: 02/19/2017  Referring Provider: Lurline Del, MD  Reason for Visit: Evaluate for hereditary susceptibility to cancer    Assessment and Plan:  . Kelsey Henry's personal history of triple negative breast cancer may be associated with a hereditary predisposition to cancer. Unfortunately, her family history is not informative as she has no information about her relatives.  . Testing is recommended to determine whether she has a pathogenic mutation that will impact her screening and risk-reduction for cancer. A negative result will be reassuring.  . Kelsey Henry wished to pursue genetic testing and a blood sample will be sent to [facility] for analysis. [facility]'s STAT breast panel was requested. Results should be available in about 7-12 days. The 9 genes on this panel are ATM, BRCA1, BRCA2, CDH1, CHEK2, PALB2, PTEN, STK11, TP53. Once this test is complete, analysis of additional genes on a larger hereditary cancer panel will proceed. She will be called after each result is obtained.    Dr. Jana Hakim was available for questions concerning this case. Total time spent by me in face-to-face counseling was approximately 30 minutes.   _____________________________________________________________________   History of Present Illness: Kelsey Henry, a 51 y.o. female, is being seen at the [facility] due to a personal history of breast cancer. She presents to clinic today to discuss the possibility of a hereditary predisposition to cancer and discuss whether genetic testing is warranted.  Kelsey Henry was diagnosed with breast cancer at the age of 19. She is indicated that she will not consider bilateral mastectomies at this time, but would like results of genetic testing prior to surgery.  The breast tumor was ER  negative, PR negative, and HER2 negative.    Past Medical History:  Diagnosis Date  . GERD (gastroesophageal reflux disease)    no meds  . Seasonal allergies   . SVD (spontaneous vaginal delivery)    x 1    Past Surgical History:  Procedure Laterality Date  . TUBAL LIGATION    . WISDOM TOOTH EXTRACTION      Social History   Socioeconomic History  . Marital status: Married    Spouse name: Not on file  . Number of children: Not on file  . Years of education: Not on file  . Highest education level: Not on file  Social Needs  . Financial resource strain: Not on file  . Food insecurity - worry: Not on file  . Food insecurity - inability: Not on file  . Transportation needs - medical: Not on file  . Transportation needs - non-medical: Not on file  Occupational History  . Not on file  Tobacco Use  . Smoking status: Never Smoker  . Smokeless tobacco: Never Used  Substance and Sexual Activity  . Alcohol use: No  . Drug use: No  . Sexual activity: Yes    Birth control/protection: Surgical  Other Topics Concern  . Not on file  Social History Narrative  . Not on file     Family History:  During the visit, a 4-generation pedigree was obtained. Family tree will be scanned in the Media tab in Epic  Kelsey Henry has no information about her biological relatives. She has a son (age 67). She has no full siblings. She has 3 maternal half-brothers and 2 half-sisters. All are older than  her.  Kelsey Henry ancestry is African American. There is no known Jewish ancestry and no consanguinity.  Discussion: We reviewed the characteristics, features and inheritance patterns of hereditary cancer syndromes. We discussed her risk of harboring a mutation in the context of her personal and family history. We discussed that her unknown family and paucity of women makes risk assessment challenging. We discussed the process of genetic testing, insurance coverage and implications of results:  positive, negative and variant of unknown significance (VUS).    Kelsey Henry questions were answered to her satisfaction today and she is welcome to call with any additional questions or concerns. Thank you for the referral and allowing [place] to share in the care of your patient.    Steele Berg, MS, [place] Certified Genetic Counselor phone: [phone] [email or web address]   ______________________________________________________________________ For Office Staff:  Number of people involved in session: 1 Was an Intern/ student involved with case: no

## 2017-02-20 ENCOUNTER — Encounter: Payer: Self-pay | Admitting: General Practice

## 2017-02-20 NOTE — Progress Notes (Signed)
[facility] Spiritual Care Note  Reached Ms Kelsey Henry by phone following this week's Breast Multidisciplinary Clinic. She reports little distress and deliberate effort to stay positive.  Per pt, she has good support from family, friends, and coworkers.  She has reviewed Support Center team/resource packet, and I will add her to the [place] calendar mailing list (hard copy) per her request.  She knows to contact Team with any interests or questions, and she plans to explore programming as she settles into her treatment plan, but please also page if immediate needs arise.  Thank you.   [place], [place], [facility] Pager [phone] Voicemail [phone]

## 2017-02-23 ENCOUNTER — Telehealth: Payer: Self-pay | Admitting: *Deleted

## 2017-02-23 ENCOUNTER — Telehealth: Payer: Self-pay | Admitting: Oncology

## 2017-02-23 NOTE — Telephone Encounter (Signed)
Spoke with patient regarding appts that were scheduled per 11/7 sch msg. Patient had further questions regarding her treatment - sent her to [facility] to speak with her regarding her treatment.

## 2017-02-23 NOTE — Telephone Encounter (Signed)
Spoke to pt concerning [place] from 02/18/17. Denies questions or concerns regarding dx or treatment care plan. Discussed with pt that once we have her sx date we will make sure her f/u appt with Dr. Jana Hakim and chemo may be changed. Received verbal understanding. Encourage pt to call with needs.

## 2017-02-23 NOTE — Telephone Encounter (Signed)
Left vm regarding [facility] from 02/18/17. Contact information provided for questions.

## 2017-02-24 ENCOUNTER — Encounter: Payer: Self-pay | Admitting: *Deleted

## 2017-02-25 ENCOUNTER — Other Ambulatory Visit: Payer: BLUE CROSS/BLUE SHIELD

## 2017-02-25 ENCOUNTER — Encounter: Payer: Self-pay | Admitting: *Deleted

## 2017-02-25 ENCOUNTER — Ambulatory Visit: Payer: Self-pay | Admitting: Genetic Counselor

## 2017-02-25 ENCOUNTER — Ambulatory Visit (HOSPITAL_COMMUNITY)
Admission: RE | Admit: 2017-02-25 | Discharge: 2017-02-25 | Disposition: A | Discharge status: Home / Self Care | Payer: BLUE CROSS/BLUE SHIELD | Source: Ambulatory Visit | Attending: Oncology | Admitting: Oncology

## 2017-02-25 ENCOUNTER — Ambulatory Visit (HOSPITAL_BASED_OUTPATIENT_CLINIC_OR_DEPARTMENT_OTHER)
Admission: RE | Admit: 2017-02-25 | Discharge: 2017-02-25 | Disposition: A | Discharge status: Home / Self Care | Payer: BLUE CROSS/BLUE SHIELD | Source: Ambulatory Visit | Attending: Oncology | Admitting: Oncology

## 2017-02-25 DIAGNOSIS — C50311 Malignant neoplasm of lower-inner quadrant of right female breast: Secondary | ICD-10-CM

## 2017-02-25 DIAGNOSIS — Z171 Estrogen receptor negative status [ER-]: Secondary | ICD-10-CM | POA: Diagnosis not present

## 2017-02-25 DIAGNOSIS — C50911 Malignant neoplasm of unspecified site of right female breast: Secondary | ICD-10-CM | POA: Diagnosis not present

## 2017-02-25 MED ORDER — GADOBENATE DIMEGLUMINE 529 MG/ML IV SOLN
15.0000 mL | Freq: Once | INTRAVENOUS | Status: AC | PRN
  Administered 2017-02-25: 11 mL via INTRAVENOUS

## 2017-02-25 NOTE — Progress Notes (Signed)
  Echocardiogram 2D Echocardiogram has been performed.  Tresa Res 02/25/2017, 9:30 AM

## 2017-02-25 NOTE — Progress Notes (Signed)
[facility]            Result Disclosure        Patient Name: Talullah Abate Patient DOB: November 21, 1965 Encounter Date: 02/25/2017  Referring Provider: Lurline Del, MD  Reason for Call: Discuss results of genetic testing- 1st of 2 results   This is a brief note to document preliminary genetic test results.  [name] was called today to discuss the first of her genetic test results. Please see the Genetics note from her visit on 02/19/2017. Due to time contraints and needing actionable results for medical management, [facility]'s STAT Breast panel was ordered first.  Preliminary Test Results: Genetic testing involved analysis of 9 genes: ATM, BRCA1, BRCA2, CDH1, CHEK2, PALB2, PTEN, STK11 and TP53 genes. Testing was normal and did not reveal a mutation in these genes. Testing is in process for the remaining genes on the Multi-Cancer panel.  Once results are obtained, [name] will be called again. She does not need to wait to proceed.  Steele Berg, MS, [place] Certified Genetic Counselor phone: [phone]

## 2017-02-26 ENCOUNTER — Telehealth: Payer: Self-pay | Admitting: Nutrition

## 2017-02-26 ENCOUNTER — Other Ambulatory Visit: Payer: Self-pay | Admitting: *Deleted

## 2017-02-26 ENCOUNTER — Other Ambulatory Visit: Payer: Self-pay | Admitting: Oncology

## 2017-02-26 DIAGNOSIS — R928 Other abnormal and inconclusive findings on diagnostic imaging of breast: Secondary | ICD-10-CM

## 2017-02-26 NOTE — Telephone Encounter (Signed)
Patient called with general questions about nutrition. Spoke with patient over the phone and discussed protein needs and sources. Discussed additional vitamin and mineral supplementation.

## 2017-03-04 ENCOUNTER — Ambulatory Visit
Admission: RE | Admit: 2017-03-04 | Discharge: 2017-03-04 | Disposition: A | Discharge status: Home / Self Care | Payer: BLUE CROSS/BLUE SHIELD | Source: Ambulatory Visit | Attending: Oncology | Admitting: Oncology

## 2017-03-04 ENCOUNTER — Encounter: Payer: Self-pay | Admitting: Genetic Counselor

## 2017-03-04 ENCOUNTER — Ambulatory Visit: Payer: Self-pay | Admitting: Genetic Counselor

## 2017-03-04 DIAGNOSIS — R928 Other abnormal and inconclusive findings on diagnostic imaging of breast: Secondary | ICD-10-CM

## 2017-03-04 DIAGNOSIS — N6313 Unspecified lump in the right breast, lower outer quadrant: Secondary | ICD-10-CM | POA: Diagnosis not present

## 2017-03-04 DIAGNOSIS — N6342 Unspecified lump in left breast, subareolar: Secondary | ICD-10-CM | POA: Diagnosis not present

## 2017-03-04 DIAGNOSIS — Z1379 Encounter for other screening for genetic and chromosomal anomalies: Secondary | ICD-10-CM

## 2017-03-04 MED ORDER — GADOBENATE DIMEGLUMINE 529 MG/ML IV SOLN
10.0000 mL | Freq: Once | INTRAVENOUS | Status: AC | PRN
  Administered 2017-03-04: 10 mL via INTRAVENOUS

## 2017-03-04 NOTE — Progress Notes (Signed)
[facility]       Genetic Test Results    Patient Name: Kelsey Henry Patient DOB: 27-Dec-1965 Patient Age: 51 y.o. Encounter Date: 03/04/2017  Referring Provider: Lurline Del, MD    Kelsey Henry was called today to discuss genetic test results. Please see the Genetics note from her visit on 02/19/2017 for a detailed discussion of her personal and family history.  Genetic Testing: At the time of [name]'s visit, she decided to pursue genetic testing of 9 genes that may be used to help guide treatment decisions. Once that test was completed, additional genes on a larger panel were analyzed. Testing which included sequencing and deletion/duplication analysis. Testing did not reveal any pathogenic mutation in any of these genes. A copy of the genetic test report will be scanned into Epic under the media tab.  The genes analyzed were the 83 genes on [facility]'s Multi-Cancer panel (ALK, APC, ATM, AXIN2, BAP1, BARD1, BLM, BMPR1A, BRCA1, BRCA2, BRIP1, CASR, CDC73, CDH1, CDK4, CDKN1B, CDKN1C, CDKN2A, CEBPA, CHEK2, CTNNA1, DICER1, DIS3L2, EGFR, EPCAM, FH, FLCN, GATA2, GPC3, GREM1, HOXB13, HRAS, KIT, MAX, MEN1, MET, MITF, MLH1, MSH2, MSH3, MSH6, MUTYH, NBN, NF1, NF2, NTHL1, PALB2, PDGFRA, PHOX2B, PMS2, POLD1, POLE, POT1, PRKAR1A, PTCH1, PTEN, RAD50, RAD51C, RAD51D, RB1, RECQL4, RET, RUNX1, SDHA, SDHAF2, SDHB, SDHC, SDHD, SMAD4, SMARCA4, SMARCB1, SMARCE1, STK11, SUFU, TERC, TERT, TMEM127, TP53, TSC1, TSC2, VHL, WRN, WT1).  Since the current test is not perfect, it is possible that there may be a gene mutation that current testing cannot detect, but that chance is small. It is possible that a different genetic factor, which has not yet been discovered or is not on this panel, is responsible for the cancer diagnoses in the family. Again, the likelihood of this is low. No additional testing is recommended at this time for Kelsey Henry.  Two Variants of Uncertain  Significance were detected: c.3040G>A (p.Ala1014Thr) in PDGFRA and c.2044C>G (p.Leu682Val) in SMARCA4. This is still considered a normal result. While at this time, it is unknown if this finding is associated with increased cancer risk, the majority of these variants get reclassified to be inconsequential. We emphasized that medical management should not be based on this finding. With time, we suspect the lab will determine the significance, if any. If we do learn more about it, we will try to contact Kelsey Henry to discuss it further. It is important to stay in touch with [place] periodically and keep the address and phone number up to date.  Cancer Screening: These results suggest that [name]'s cancer was most likely not due to an inherited predisposition. Most cancers happen by chance and this test, along with details of her family history, suggests that her cancer falls into this category. We discussed continuing to follow the cancer screening guidelines provided by her physician.   Family Members: Given that Kelsey Henry was diagnosed with breast cancer at age 62, women are recommended to speak with their own providers about having a yearly mammogram beginning at age 57, which is 10 years earlier than the youngest breast cancer in the family and discuss with their own provider whether there is a benefit to adding tomosynthesis to the mammogram. Women are recommended to also have a clinical breast exam every 12 months, a yearly gynecologic exam and perform monthly breast self-exams. Colon cancer screening is recommended to begin by age 77 in both men and women, unless there  is a family history of colon cancer or colon polyps or an individual has a personal history to warrant initiating screening at a younger age.  Any relative who had cancer at a young age or had a particularly rare cancer may also wish to pursue genetic testing. Genetic counselors can be located in other cities, by visiting the  website of the [employer] ([employer]) and Field seismologist for a Dietitian by zip code. Family members are not recommended to get tested for the above VUSs outside of a research protocol as this finding has no implications for their medical management.    Lastly, cancer genetics is a rapidly advancing field and it is possible that new genetic tests will be appropriate for Kelsey Henry in the future. We encourage her to remain in contact with [place] on an annual basis so we can update her personal and family histories, and let her know of advances in cancer genetics that may benefit the family. Our contact number was provided. Kelsey Henry is welcome to call anytime with additional questions.     Steele Berg, MS, [place] Certified Genetic Counselor phone: [phone]

## 2017-03-09 ENCOUNTER — Telehealth: Payer: Self-pay

## 2017-03-10 ENCOUNTER — Telehealth: Payer: Self-pay | Admitting: *Deleted

## 2017-03-10 NOTE — Telephone Encounter (Signed)
Called pt to discuss future appts. Informed pt that since she has not had sx as of yet, that we will cancel her appts to see Dr. Jana Hakim and chemo. Received verbal understanding.

## 2017-03-11 ENCOUNTER — Telehealth (HOSPITAL_COMMUNITY): Payer: Self-pay | Admitting: Vascular Surgery

## 2017-03-11 NOTE — Telephone Encounter (Signed)
Left pt message to make np brst appt w/ echo in Feb

## 2017-03-12 ENCOUNTER — Other Ambulatory Visit: Payer: Self-pay | Admitting: General Surgery

## 2017-03-12 DIAGNOSIS — C50011 Malignant neoplasm of nipple and areola, right female breast: Secondary | ICD-10-CM

## 2017-03-12 DIAGNOSIS — Z170 Estrogen receptor positive status [ER+]: Principal | ICD-10-CM

## 2017-03-13 ENCOUNTER — Ambulatory Visit: Payer: BLUE CROSS/BLUE SHIELD | Admitting: Oncology

## 2017-03-16 ENCOUNTER — Other Ambulatory Visit: Payer: Self-pay | Admitting: General Surgery

## 2017-03-16 DIAGNOSIS — C50011 Malignant neoplasm of nipple and areola, right female breast: Secondary | ICD-10-CM

## 2017-03-16 DIAGNOSIS — Z170 Estrogen receptor positive status [ER+]: Principal | ICD-10-CM

## 2017-03-17 ENCOUNTER — Other Ambulatory Visit: Payer: Self-pay

## 2017-03-17 ENCOUNTER — Telehealth: Payer: Self-pay | Admitting: *Deleted

## 2017-03-17 ENCOUNTER — Ambulatory Visit: Payer: BLUE CROSS/BLUE SHIELD

## 2017-03-17 ENCOUNTER — Encounter (HOSPITAL_BASED_OUTPATIENT_CLINIC_OR_DEPARTMENT_OTHER): Payer: Self-pay | Admitting: *Deleted

## 2017-03-17 ENCOUNTER — Other Ambulatory Visit: Payer: BLUE CROSS/BLUE SHIELD

## 2017-03-17 NOTE — Telephone Encounter (Signed)
Completed FMLA paperwork.  Left message for patient to pick up at front desk.

## 2017-03-18 NOTE — Progress Notes (Signed)
Ensure pre surgery drink given with instructions to complete by 0500 dos, hibiclens soap given with instructions, pt verbalized understanding.

## 2017-03-20 ENCOUNTER — Telehealth: Payer: Self-pay | Admitting: *Deleted

## 2017-03-20 ENCOUNTER — Ambulatory Visit
Admission: RE | Admit: 2017-03-20 | Discharge: 2017-03-20 | Disposition: A | Discharge status: Home / Self Care | Payer: BLUE CROSS/BLUE SHIELD | Source: Ambulatory Visit | Attending: General Surgery | Admitting: General Surgery

## 2017-03-20 DIAGNOSIS — C50011 Malignant neoplasm of nipple and areola, right female breast: Secondary | ICD-10-CM

## 2017-03-20 DIAGNOSIS — C50911 Malignant neoplasm of unspecified site of right female breast: Secondary | ICD-10-CM | POA: Diagnosis not present

## 2017-03-20 DIAGNOSIS — Z170 Estrogen receptor positive status [ER+]: Principal | ICD-10-CM

## 2017-03-20 NOTE — Telephone Encounter (Signed)
Scheduled and confirmed appt with Dr. Jana Hakim on 12/21 at 1:30. For post op appt

## 2017-03-23 ENCOUNTER — Ambulatory Visit (HOSPITAL_COMMUNITY): Payer: BLUE CROSS/BLUE SHIELD | Admitting: Certified Registered Nurse Anesthetist

## 2017-03-23 ENCOUNTER — Encounter (HOSPITAL_COMMUNITY)
Admission: RE | Admit: 2017-03-23 | Discharge: 2017-03-23 | Disposition: A | Discharge status: Home / Self Care | Payer: BLUE CROSS/BLUE SHIELD | Source: Ambulatory Visit | Attending: General Surgery | Admitting: General Surgery

## 2017-03-23 ENCOUNTER — Ambulatory Visit (HOSPITAL_BASED_OUTPATIENT_CLINIC_OR_DEPARTMENT_OTHER)
Admission: RE | Admit: 2017-03-23 | Discharge: 2017-03-23 | Disposition: A | Discharge status: Home / Self Care | Payer: BLUE CROSS/BLUE SHIELD | Source: Ambulatory Visit | Attending: General Surgery | Admitting: General Surgery

## 2017-03-23 ENCOUNTER — Encounter (HOSPITAL_COMMUNITY)
Admission: RE | Disposition: A | Discharge status: Home / Self Care | Payer: Self-pay | Source: Ambulatory Visit | Attending: General Surgery

## 2017-03-23 ENCOUNTER — Ambulatory Visit
Admission: RE | Admit: 2017-03-23 | Discharge: 2017-03-23 | Disposition: A | Discharge status: Home / Self Care | Payer: BLUE CROSS/BLUE SHIELD | Source: Ambulatory Visit | Attending: General Surgery | Admitting: General Surgery

## 2017-03-23 ENCOUNTER — Ambulatory Visit (HOSPITAL_COMMUNITY): Payer: BLUE CROSS/BLUE SHIELD

## 2017-03-23 ENCOUNTER — Other Ambulatory Visit: Payer: Self-pay

## 2017-03-23 ENCOUNTER — Encounter (HOSPITAL_COMMUNITY): Payer: Self-pay | Admitting: *Deleted

## 2017-03-23 DIAGNOSIS — G8918 Other acute postprocedural pain: Secondary | ICD-10-CM | POA: Diagnosis not present

## 2017-03-23 DIAGNOSIS — Z170 Estrogen receptor positive status [ER+]: Principal | ICD-10-CM

## 2017-03-23 DIAGNOSIS — Z9889 Other specified postprocedural states: Secondary | ICD-10-CM | POA: Insufficient documentation

## 2017-03-23 DIAGNOSIS — Z833 Family history of diabetes mellitus: Secondary | ICD-10-CM | POA: Diagnosis not present

## 2017-03-23 DIAGNOSIS — C50311 Malignant neoplasm of lower-inner quadrant of right female breast: Secondary | ICD-10-CM | POA: Diagnosis not present

## 2017-03-23 DIAGNOSIS — D0511 Intraductal carcinoma in situ of right breast: Secondary | ICD-10-CM | POA: Insufficient documentation

## 2017-03-23 DIAGNOSIS — Z832 Family history of diseases of the blood and blood-forming organs and certain disorders involving the immune mechanism: Secondary | ICD-10-CM | POA: Diagnosis not present

## 2017-03-23 DIAGNOSIS — Z171 Estrogen receptor negative status [ER-]: Secondary | ICD-10-CM | POA: Diagnosis not present

## 2017-03-23 DIAGNOSIS — Z419 Encounter for procedure for purposes other than remedying health state, unspecified: Secondary | ICD-10-CM

## 2017-03-23 DIAGNOSIS — Z90710 Acquired absence of both cervix and uterus: Secondary | ICD-10-CM | POA: Insufficient documentation

## 2017-03-23 DIAGNOSIS — Z95828 Presence of other vascular implants and grafts: Secondary | ICD-10-CM

## 2017-03-23 DIAGNOSIS — Z8249 Family history of ischemic heart disease and other diseases of the circulatory system: Secondary | ICD-10-CM | POA: Diagnosis not present

## 2017-03-23 DIAGNOSIS — C50911 Malignant neoplasm of unspecified site of right female breast: Secondary | ICD-10-CM | POA: Diagnosis not present

## 2017-03-23 DIAGNOSIS — C50011 Malignant neoplasm of nipple and areola, right female breast: Secondary | ICD-10-CM

## 2017-03-23 DIAGNOSIS — Z811 Family history of alcohol abuse and dependence: Secondary | ICD-10-CM | POA: Insufficient documentation

## 2017-03-23 DIAGNOSIS — N920 Excessive and frequent menstruation with regular cycle: Secondary | ICD-10-CM | POA: Diagnosis not present

## 2017-03-23 DIAGNOSIS — Z8042 Family history of malignant neoplasm of prostate: Secondary | ICD-10-CM | POA: Insufficient documentation

## 2017-03-23 DIAGNOSIS — C50511 Malignant neoplasm of lower-outer quadrant of right female breast: Secondary | ICD-10-CM | POA: Diagnosis not present

## 2017-03-23 DIAGNOSIS — Z452 Encounter for adjustment and management of vascular access device: Secondary | ICD-10-CM | POA: Diagnosis not present

## 2017-03-23 HISTORY — PX: BREAST LUMPECTOMY: SHX2

## 2017-03-23 HISTORY — PX: PORTACATH PLACEMENT: SHX2246

## 2017-03-23 HISTORY — DX: Malignant (primary) neoplasm, unspecified: C80.1

## 2017-03-23 HISTORY — PX: BREAST LUMPECTOMY WITH RADIOACTIVE SEED AND SENTINEL LYMPH NODE BIOPSY: SHX6550

## 2017-03-23 LAB — CBC
HCT: 40.3 % (ref 36.0–46.0)
Hemoglobin: 13.7 g/dL (ref 12.0–15.0)
MCH: 30.2 pg (ref 26.0–34.0)
MCHC: 34 g/dL (ref 30.0–36.0)
MCV: 88.8 fL (ref 78.0–100.0)
PLATELETS: 244 10*3/uL (ref 150–400)
RBC: 4.54 MIL/uL (ref 3.87–5.11)
RDW: 12.4 % (ref 11.5–15.5)
WBC: 7.3 10*3/uL (ref 4.0–10.5)

## 2017-03-23 SURGERY — BREAST LUMPECTOMY WITH RADIOACTIVE SEED AND SENTINEL LYMPH NODE BIOPSY
Anesthesia: General | Site: Breast | Laterality: Right

## 2017-03-23 MED ORDER — FENTANYL CITRATE (PF) 100 MCG/2ML IJ SOLN
INTRAMUSCULAR | Status: DC | PRN
  Administered 2017-03-23: 50 ug via INTRAVENOUS
  Administered 2017-03-23: 100 ug via INTRAVENOUS

## 2017-03-23 MED ORDER — MIDAZOLAM HCL 2 MG/2ML IJ SOLN
INTRAMUSCULAR | Status: AC
  Filled 2017-03-23: qty 2

## 2017-03-23 MED ORDER — ONDANSETRON HCL 4 MG/2ML IJ SOLN
INTRAMUSCULAR | Status: DC | PRN
  Administered 2017-03-23: 4 mg via INTRAVENOUS

## 2017-03-23 MED ORDER — PROPOFOL 10 MG/ML IV BOLUS
INTRAVENOUS | Status: DC | PRN
  Administered 2017-03-23: 160 mg via INTRAVENOUS

## 2017-03-23 MED ORDER — EPHEDRINE SULFATE 50 MG/ML IJ SOLN
INTRAMUSCULAR | Status: DC | PRN
  Administered 2017-03-23: 10 mg via INTRAVENOUS
  Administered 2017-03-23 (×2): 5 mg via INTRAVENOUS
  Administered 2017-03-23: 10 mg via INTRAVENOUS
  Administered 2017-03-23 (×2): 5 mg via INTRAVENOUS

## 2017-03-23 MED ORDER — OXYCODONE HCL 5 MG/5ML PO SOLN: 5.0000 mg | Freq: Once | ORAL | Status: DC | PRN

## 2017-03-23 MED ORDER — LIDOCAINE 2% (20 MG/ML) 5 ML SYRINGE
INTRAMUSCULAR | Status: DC | PRN
  Administered 2017-03-23: 40 mg via INTRAVENOUS

## 2017-03-23 MED ORDER — FENTANYL CITRATE (PF) 100 MCG/2ML IJ SOLN
INTRAMUSCULAR | Status: AC
  Administered 2017-03-23: 100 ug
  Filled 2017-03-23: qty 2

## 2017-03-23 MED ORDER — GABAPENTIN 300 MG PO CAPS
300.0000 mg | ORAL_CAPSULE | ORAL | Status: AC
  Administered 2017-03-23: 300 mg via ORAL
  Filled 2017-03-23: qty 1

## 2017-03-23 MED ORDER — HEPARIN SODIUM (PORCINE) 5000 UNIT/ML IJ SOLN
INTRAMUSCULAR | Status: DC | PRN
  Administered 2017-03-23: 11:00:00

## 2017-03-23 MED ORDER — BUPIVACAINE HCL (PF) 0.25 % IJ SOLN
INTRAMUSCULAR | Status: AC
  Filled 2017-03-23: qty 30

## 2017-03-23 MED ORDER — PROPOFOL 10 MG/ML IV BOLUS
INTRAVENOUS | Status: AC
  Filled 2017-03-23: qty 20

## 2017-03-23 MED ORDER — TECHNETIUM TC 99M SULFUR COLLOID FILTERED
1.0000 | Freq: Once | INTRAVENOUS | Status: AC | PRN
  Administered 2017-03-23: 1 via INTRADERMAL

## 2017-03-23 MED ORDER — BUPIVACAINE HCL (PF) 0.25 % IJ SOLN
INTRAMUSCULAR | Status: DC | PRN
  Administered 2017-03-23: 13 mL

## 2017-03-23 MED ORDER — OXYCODONE HCL 5 MG PO TABS: 5.0000 mg | ORAL_TABLET | Freq: Once | ORAL | Status: DC | PRN

## 2017-03-23 MED ORDER — MIDAZOLAM HCL 2 MG/2ML IJ SOLN
2.0000 mg | Freq: Once | INTRAMUSCULAR | Status: DC
  Filled 2017-03-23: qty 2

## 2017-03-23 MED ORDER — HEPARIN SOD (PORK) LOCK FLUSH 100 UNIT/ML IV SOLN
INTRAVENOUS | Status: AC
  Filled 2017-03-23: qty 5

## 2017-03-23 MED ORDER — CEFAZOLIN SODIUM-DEXTROSE 2-4 GM/100ML-% IV SOLN
2.0000 g | INTRAVENOUS | Status: AC
  Administered 2017-03-23: 2 g via INTRAVENOUS
  Filled 2017-03-23: qty 100

## 2017-03-23 MED ORDER — HEPARIN SOD (PORK) LOCK FLUSH 100 UNIT/ML IV SOLN
INTRAVENOUS | Status: DC | PRN
  Administered 2017-03-23: 500 [IU] via INTRAVENOUS

## 2017-03-23 MED ORDER — ACETAMINOPHEN 500 MG PO TABS
1000.0000 mg | ORAL_TABLET | ORAL | Status: AC
Start: 2017-03-23 — End: 2017-03-23
  Administered 2017-03-23: 1000 mg via ORAL
  Filled 2017-03-23: qty 2

## 2017-03-23 MED ORDER — HYDROMORPHONE HCL 1 MG/ML IJ SOLN: 0.2500 mg | INTRAMUSCULAR | Status: DC | PRN

## 2017-03-23 MED ORDER — DEXAMETHASONE SODIUM PHOSPHATE 10 MG/ML IJ SOLN
INTRAMUSCULAR | Status: DC | PRN
  Administered 2017-03-23: 5 mg via INTRAVENOUS

## 2017-03-23 MED ORDER — FENTANYL CITRATE (PF) 250 MCG/5ML IJ SOLN
INTRAMUSCULAR | Status: AC
  Filled 2017-03-23: qty 5

## 2017-03-23 MED ORDER — FENTANYL CITRATE (PF) 100 MCG/2ML IJ SOLN
100.0000 ug | Freq: Once | INTRAMUSCULAR | Status: DC
  Filled 2017-03-23: qty 2

## 2017-03-23 MED ORDER — ONDANSETRON HCL 4 MG/2ML IJ SOLN: 4.0000 mg | Freq: Once | INTRAMUSCULAR | Status: DC | PRN

## 2017-03-23 MED ORDER — MIDAZOLAM HCL 2 MG/2ML IJ SOLN
INTRAMUSCULAR | Status: AC
  Administered 2017-03-23: 2 mg
  Filled 2017-03-23: qty 2

## 2017-03-23 MED ORDER — OXYCODONE HCL 5 MG PO TABS: 5.0000 mg | ORAL_TABLET | Freq: Four times a day (QID) | ORAL | 0 refills | Status: DC | PRN

## 2017-03-23 MED ORDER — LACTATED RINGERS IV SOLN
INTRAVENOUS | Status: DC
  Administered 2017-03-23 (×2): via INTRAVENOUS

## 2017-03-23 SURGICAL SUPPLY — 77 items
ADH SKN CLS APL DERMABOND .7 (GAUZE/BANDAGES/DRESSINGS) ×2
APPLIER CLIP 9.375 MED OPEN (MISCELLANEOUS) ×2
APR CLP MED 9.3 20 MLT OPN (MISCELLANEOUS) ×1
BAG DECANTER FOR FLEXI CONT (MISCELLANEOUS) ×2 IMPLANT
BINDER BREAST LRG (GAUZE/BANDAGES/DRESSINGS) ×1 IMPLANT
BINDER BREAST XLRG (GAUZE/BANDAGES/DRESSINGS) IMPLANT
BLADE SURG 11 STRL SS (BLADE) ×2 IMPLANT
BLADE SURG 15 STRL LF DISP TIS (BLADE) ×1 IMPLANT
BLADE SURG 15 STRL SS (BLADE) ×2
CANISTER SUCT 3000ML PPV (MISCELLANEOUS) ×2 IMPLANT
CHLORAPREP W/TINT 26ML (MISCELLANEOUS) ×3 IMPLANT
CLIP APPLIE 9.375 MED OPEN (MISCELLANEOUS) ×1 IMPLANT
CONT SPEC 4OZ CLIKSEAL STRL BL (MISCELLANEOUS) ×4 IMPLANT
COVER PROBE W GEL 5X96 (DRAPES) ×2 IMPLANT
COVER SURGICAL LIGHT HANDLE (MISCELLANEOUS) ×2 IMPLANT
COVER TRANSDUCER ULTRASND GEL (DRAPE) ×2 IMPLANT
CRADLE DONUT ADULT HEAD (MISCELLANEOUS) ×2 IMPLANT
DECANTER SPIKE VIAL GLASS SM (MISCELLANEOUS) ×2 IMPLANT
DERMABOND ADVANCED (GAUZE/BANDAGES/DRESSINGS) ×2
DERMABOND ADVANCED .7 DNX12 (GAUZE/BANDAGES/DRESSINGS) ×1 IMPLANT
DEVICE DUBIN SPECIMEN MAMMOGRA (MISCELLANEOUS) ×2 IMPLANT
DRAPE C-ARM 42X72 X-RAY (DRAPES) ×2 IMPLANT
DRAPE CHEST BREAST 15X10 FENES (DRAPES) ×2 IMPLANT
DRAPE UTILITY XL STRL (DRAPES) ×3 IMPLANT
ELECT CAUTERY BLADE 6.4 (BLADE) ×2 IMPLANT
ELECT COATED BLADE 2.86 ST (ELECTRODE) ×2 IMPLANT
ELECT REM PT RETURN 9FT ADLT (ELECTROSURGICAL) ×2
ELECTRODE REM PT RTRN 9FT ADLT (ELECTROSURGICAL) ×1 IMPLANT
GAUZE SPONGE 4X4 16PLY XRAY LF (GAUZE/BANDAGES/DRESSINGS) ×2 IMPLANT
GEL ULTRASOUND 20GR AQUASONIC (MISCELLANEOUS) ×1 IMPLANT
GLOVE BIO SURGEON STRL SZ7 (GLOVE) ×4 IMPLANT
GLOVE BIOGEL M 7.0 STRL (GLOVE) ×2 IMPLANT
GLOVE BIOGEL PI IND STRL 7.5 (GLOVE) ×1 IMPLANT
GLOVE BIOGEL PI INDICATOR 7.5 (GLOVE) ×1
GOWN STRL REUS W/ TWL LRG LVL3 (GOWN DISPOSABLE) ×2 IMPLANT
GOWN STRL REUS W/TWL LRG LVL3 (GOWN DISPOSABLE) ×8
ILLUMINATOR WAVEGUIDE N/F (MISCELLANEOUS) IMPLANT
INTRODUCER COOK 11FR (CATHETERS) IMPLANT
KIT BASIN OR (CUSTOM PROCEDURE TRAY) ×2 IMPLANT
KIT MARKER MARGIN INK (KITS) ×2 IMPLANT
KIT PORT POWER 8FR ISP CVUE (Miscellaneous) ×1 IMPLANT
KIT ROOM TURNOVER OR (KITS) ×2 IMPLANT
MARKER SKIN DUAL TIP RULER LAB (MISCELLANEOUS) ×2 IMPLANT
NDL FILTER BLUNT 18X1 1/2 (NEEDLE) IMPLANT
NDL HYPO 25GX1X1/2 BEV (NEEDLE) ×1 IMPLANT
NDL SAFETY ECLIPSE 18X1.5 (NEEDLE) IMPLANT
NEEDLE FILTER BLUNT 18X 1/2SAF (NEEDLE)
NEEDLE FILTER BLUNT 18X1 1/2 (NEEDLE) IMPLANT
NEEDLE HYPO 18GX1.5 SHARP (NEEDLE)
NEEDLE HYPO 25GX1X1/2 BEV (NEEDLE) ×2 IMPLANT
NS IRRIG 1000ML POUR BTL (IV SOLUTION) ×2 IMPLANT
PACK SURGICAL SETUP 50X90 (CUSTOM PROCEDURE TRAY) ×2 IMPLANT
PAD ARMBOARD 7.5X6 YLW CONV (MISCELLANEOUS) ×4 IMPLANT
PENCIL BUTTON HOLSTER BLD 10FT (ELECTRODE) ×2 IMPLANT
SET INTRODUCER 12FR PACEMAKER (SHEATH) IMPLANT
SET SHEATH INTRODUCER 10FR (MISCELLANEOUS) IMPLANT
SHEATH COOK PEEL AWAY SET 9F (SHEATH) IMPLANT
SPONGE LAP 18X18 X RAY DECT (DISPOSABLE) ×2 IMPLANT
STRIP CLOSURE SKIN 1/2X4 (GAUZE/BANDAGES/DRESSINGS) ×2 IMPLANT
SUT MNCRL AB 4-0 PS2 18 (SUTURE) ×4 IMPLANT
SUT PROLENE 2 0 SH DA (SUTURE) ×2 IMPLANT
SUT SILK 2 0 (SUTURE)
SUT SILK 2 0 SH (SUTURE) ×1 IMPLANT
SUT SILK 2-0 18XBRD TIE 12 (SUTURE) IMPLANT
SUT VIC AB 2-0 SH 27 (SUTURE) ×4
SUT VIC AB 2-0 SH 27XBRD (SUTURE) ×2 IMPLANT
SUT VIC AB 3-0 SH 27 (SUTURE) ×6
SUT VIC AB 3-0 SH 27X BRD (SUTURE) ×2 IMPLANT
SUT VIC AB 3-0 SH 27XBRD (SUTURE) ×1 IMPLANT
SYR 20ML ECCENTRIC (SYRINGE) ×4 IMPLANT
SYR 5ML LUER SLIP (SYRINGE) ×2 IMPLANT
SYR BULB 3OZ (MISCELLANEOUS) ×2 IMPLANT
SYR CONTROL 10ML LL (SYRINGE) ×2 IMPLANT
TOWEL OR 17X24 6PK STRL BLUE (TOWEL DISPOSABLE) ×2 IMPLANT
TOWEL OR 17X26 10 PK STRL BLUE (TOWEL DISPOSABLE) ×2 IMPLANT
TUBE CONNECTING 12X1/4 (SUCTIONS) ×2 IMPLANT
YANKAUER SUCT BULB TIP NO VENT (SUCTIONS) ×2 IMPLANT

## 2017-03-23 NOTE — Anesthesia Preprocedure Evaluation (Addendum)
Anesthesia Evaluation  Patient identified by MRN, date of birth, ID band Patient awake    Reviewed: Allergy & Precautions, NPO status , Patient's Chart, lab work & pertinent test results  Airway Mallampati: II  TM Distance: >3 FB Neck ROM: Full    Dental  (+) Teeth Intact, Dental Advisory Given   Pulmonary    breath sounds clear to auscultation       Cardiovascular  Rhythm:Regular Rate:Normal     Neuro/Psych    GI/Hepatic   Endo/Other    Renal/GU      Musculoskeletal   Abdominal   Peds  Hematology   Anesthesia Other Findings   Reproductive/Obstetrics                             Anesthesia Physical Anesthesia Plan  ASA: II  Anesthesia Plan: General   Post-op Pain Management:  Regional for Post-op pain   Induction: Intravenous  PONV Risk Score and Plan: Ondansetron and Dexamethasone  Airway Management Planned: LMA  Additional Equipment:   Intra-op Plan:   Post-operative Plan:   Informed Consent: I have reviewed the patients History and Physical, chart, labs and discussed the procedure including the risks, benefits and alternatives for the proposed anesthesia with the patient or authorized representative who has indicated his/her understanding and acceptance.     Dental advisory given  Plan Discussed with: CRNA and Anesthesiologist  Anesthesia Plan Comments:         Anesthesia Quick Evaluation  

## 2017-03-23 NOTE — Anesthesia Postprocedure Evaluation (Signed)
Anesthesia Post Note  Patient: Kelsey Henry  Procedure(s) Performed: BREAST LUMPECTOMY WITH RADIOACTIVE SEED AND SENTINEL LYMPH NODE BIOPSY (Right Breast) INSERTION PORT-A-CATH (Right Breast)     Patient location during evaluation: PACU Anesthesia Type: General Level of consciousness: awake and alert Pain management: pain level controlled Vital Signs Assessment: post-procedure vital signs reviewed and stable Respiratory status: spontaneous breathing, nonlabored ventilation, respiratory function stable and patient connected to nasal cannula oxygen Cardiovascular status: blood pressure returned to baseline and stable Postop Assessment: no apparent nausea or vomiting Anesthetic complications: no    Last Vitals:  Vitals:   03/23/17 1153 03/23/17 1206  BP: 127/80 132/86  Pulse: 83 83  Resp: 15 16  Temp:  (!) 36.4 C  SpO2: 99% 97%    Last Pain:  Vitals:   03/23/17 1215  TempSrc:   PainSc: 0-No pain                 Jefferie Holston,JAMES TERRILL

## 2017-03-23 NOTE — H&P (Signed)
56 yof referred by Dr Lisbeth Renshaw for new right breast cancer. she has no prior history and no family history. she had no palpable mass or nipple discharge. she had mm that showed d density breasts. she had a screening detected right breast abnormality. there is a 9x7x19mm mass in the right breast and the axilla is negative. 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 she is here with her husband today.   Past Surgical History  Breast Biopsy  Right. Hysterectomy (not due to cancer) - Partial   Diagnostic Studies History  Colonoscopy  never Mammogram  within last year Pap Smear  1-5 years ago  Medication History  Medications Reconciled  Social History  Alcohol use  Occasional alcohol use. Caffeine use  Carbonated beverages, Tea. No drug use  Tobacco use  Never smoker.  Family History ( Alcohol Abuse  Father, Mother. Bleeding disorder  Mother. Diabetes Mellitus  Family Members In General, Mother. Hypertension  Family Members In General, Mother. Prostate Cancer  Family Members In General.  Pregnancy / Birth History Contraceptive History  Contraceptive implant, Depo-provera, Oral contraceptives.   Review of Systems  General Present- Night Sweats and Weight Loss. Not Present- Appetite Loss, Chills, Fatigue, Fever and Weight Gain. Skin Not Present- Change in Wart/Mole, Dryness, Hives, Jaundice, New Lesions, Non-Healing Wounds, Rash and Ulcer. HEENT Present- Hoarseness and Wears glasses/contact lenses. Not Present- Earache, Hearing Loss, Nose Bleed, Oral Ulcers, Ringing in the Ears, Seasonal Allergies, Sinus Pain, Sore Throat, Visual Disturbances and Yellow Eyes. Respiratory Not Present- Bloody sputum, Chronic Cough, Difficulty Breathing, Snoring and Wheezing. Breast Present- Breast Mass. Not Present- Breast Pain, Nipple Discharge and Skin Changes. Cardiovascular Not Present- Chest Pain, Difficulty Breathing Lying Down, Leg Cramps, Palpitations, Rapid  Heart Rate, Shortness of Breath and Swelling of Extremities. Gastrointestinal Present- Constipation, Excessive gas and Gets full quickly at meals. Not Present- Abdominal Pain, Bloating, Bloody Stool, Change in Bowel Habits, Chronic diarrhea, Difficulty Swallowing, Hemorrhoids, Indigestion, Nausea, Rectal Pain and Vomiting. Female Genitourinary Not Present- Frequency, Nocturia, Painful Urination, Pelvic Pain and Urgency. Musculoskeletal Not Present- Back Pain, Joint Pain, Joint Stiffness, Muscle Pain, Muscle Weakness and Swelling of Extremities. Neurological Not Present- Decreased Memory, Fainting, Headaches, Numbness, Seizures, Tingling, Tremor, Trouble walking and Weakness. Psychiatric Not Present- Anxiety, Bipolar, Change in Sleep Pattern, Depression, Fearful and Frequent crying. Endocrine Present- Hot flashes. Not Present- Cold Intolerance, Excessive Hunger, Hair Changes, Heat Intolerance and New Diabetes. Hematology Present- Easy Bruising. Not Present- Blood Thinners, Excessive bleeding, Gland problems, HIV and Persistent Infections.   Physical Exam  General Mental Status-Alert. Head and Neck Trachea-midline. Thyroid Gland Characteristics - normal size and consistency. Eye Sclera/Conjunctiva - Bilateral-No scleral icterus. Chest and Lung Exam Chest and lung exam reveals -quiet, even and easy respiratory effort with no use of accessory muscles and on auscultation, normal breath sounds, no adventitious sounds and normal vocal resonance. Breast Nipples-No Discharge. Breast Lump-No Palpable Breast Mass. Cardiovascular Cardiovascular examination reveals -normal heart sounds, regular rate and rhythm with no murmurs. Abdomen Note: soft nt Neuropsychiatric Mental status exam performed with findings of-Oriented X3 with appropriate mood and affect. Lymphatic Head & Neck General Head & Neck Lymphatics: Bilateral - Description - Normal. Axillary General Axillary Region:  Bilateral - Description - Normal. Note: no Emmett adenopathy   Assessment & Plan  BREAST CANCER OF LOWER-OUTER QUADRANT OF RIGHT FEMALE BREAST (C50.511) Story: Right breast seed guided lumpectomy, right axillary sentinel node biopsy, port placement we discussed pathophysiology of breast cancer and all available  treatment options. she will recieve chemotherapy so we discussed a right ij port at time of surgery. I will also obtain mri given triple negative, age and d density breasts. we discussed this may change the plan and may need more biopsies. she also has genetics pending. I discussed sentinel node biopsy and performance with radioactivity. discussed 5% risk of lymphedema postop as well as we would await final path prior to deciding further therapy. she might be recommended to return for alnd. I also discussed lumpectomy vs mastectomy. lumpectomy would carry 5% risk of a second surgery for positive margins. it would also be accompanied by radiotherapy. mastectomy does not provide more benefit for her at this point though unless something shows up on mri or genetic testing. we discussed lumpectomy with seed guidance. will await mri and then I will call her to move forward

## 2017-03-23 NOTE — Anesthesia Procedure Notes (Addendum)
Anesthesia Regional Block: Pectoralis block   Pre-Anesthetic Checklist: ,, timeout performed, Correct Patient, Correct Site, Correct Laterality, Correct Procedure, Correct Position, site marked, Risks and benefits discussed,  Surgical consent,  Pre-op evaluation,  At surgeon's request and post-op pain management  Laterality: Right  Prep: chloraprep       Needles:   Needle Type: Stimulator Needle - 80          Additional Needles:   Procedures:,,,, ultrasound used (permanent image in chart),,,,  Narrative:  Start time: 03/23/2017 9:05 AM End time: 03/23/2017 9:10 AM Injection made incrementally with aspirations every 5 mL.  Performed by: Personally   Additional Notes: 20 cc 0.75% Ropivacaine injected easily

## 2017-03-23 NOTE — Anesthesia Procedure Notes (Signed)
Procedure Name: LMA Insertion Date/Time: 03/23/2017 9:32 AM Performed by: White, Amedeo Plenty, CRNA Pre-anesthesia Checklist: Patient identified, Emergency Drugs available, Suction available and Patient being monitored Patient Re-evaluated:Patient Re-evaluated prior to induction Oxygen Delivery Method: Circle System Utilized Preoxygenation: Pre-oxygenation with 100% oxygen Induction Type: IV induction Ventilation: Mask ventilation without difficulty LMA: LMA inserted LMA Size: 4.0 Number of attempts: 1 Placement Confirmation: positive ETCO2 Tube secured with: Tape Dental Injury: Teeth and Oropharynx as per pre-operative assessment

## 2017-03-23 NOTE — Transfer of Care (Signed)
Immediate Anesthesia Transfer of Care Note  Patient: Kelsey Henry  Procedure(s) Performed: BREAST LUMPECTOMY WITH RADIOACTIVE SEED AND SENTINEL LYMPH NODE BIOPSY (Right Breast) INSERTION PORT-A-CATH (Right Breast)  Patient Location: PACU  Anesthesia Type:GA combined with regional for post-op pain  Level of Consciousness: drowsy and responds to stimulation  Airway & Oxygen Therapy: Patient Spontanous Breathing  Post-op Assessment: Report given to RN and Post -op Vital signs reviewed and stable  Post vital signs: Reviewed and stable  Last Vitals:  Vitals:   03/23/17 0910 03/23/17 1124  BP: 133/85 128/75  Pulse: 98 95  Resp: 16 20  Temp:  (!) 36.2 C  SpO2: 100% 100%    Last Pain:  Vitals:   03/23/17 1124  TempSrc:   PainSc: (P) 0-No pain      Patients Stated Pain Goal: 4 (83/35/82 5189)  Complications: No apparent anesthesia complications

## 2017-03-23 NOTE — Op Note (Signed)
Preoperative diagnosis: Rightbreast cancer, clinical stage I Postoperative diagnosis: same as above Procedure:Rightbreast seed guided lumpectomy Right deep axillary sentinel node biopsy Port placment Surgeon: Dr Serita Grammes MWN:[id number] Anes: general  Specimens  1.Rightbreast tissue marked with paint 2.Rightaxillary sentinel nodes with highest [id number] 3. Additional right breast superior, medial, lateral margins marked short superior, long lateral, double deep Complications none Drains none Sponge count correct Dispo to pacu stable  Indications: This is a51yof with a newly diagnosed clinical stage Irightbreast cancer. This is triple negative and oncology requested port placement for chemotherapy also. She underwent mri evaluation with a couple of other negative biopsies. We discussed options and have elected to proceed with seed guided lumpectomy and sentinel node biopsy.   Procedure: After informed consent was obtained the patient was taken to the operating room. She first was given technetium in standard periareolar fashion. She had a pectoral block. She was given antibiotics. Sequential compression devices were on her legs. She was then placed under general anesthesia with an LMA. Then she was prepped and draped in the standard sterile surgical fashion. Surgical timeout was then performed. I then located the seed in the lower central rightbreastI infiltrated marcaine in the skin and then madean incision in the inframammary crease to hide the scar.I then used the neoprobe to remove the seed and the surrounding tissue with attempt to get clear margins. I marked this with paint. MM confirmed removal of seed and theclip.I did remove additional margins that I thought might be close. I placed clips in the cavity.I then obtained hemostasis. This was marked as above.I approximated the breast tissue with 2-0 vicryl. The skin was closed with 3-0 vicryl and 4-0  monocryl. Glue and steristrips were applied. I then infiltrated marcaine in the low axilla and made an incision below the hairline.Icarried this through the axillary fascia.there was radioactivity present that was easily noted.  I removed the radioactive nodes.There were no enlarged nodes. The background radioactivity was minimal. I then obtained hemostasis. I closed the axillary fascia with 2-0 vicryl.The skin was closed with 3-0 vicryl and 4-0 monocryl. Glue and steristrips were applied. A binder was placed.  She was then reprepped and draped for the port placement. Arms were tucked and appropriately padded. I used the ultrasound to identify the right internal jugular vein. I then accessed the vein using the ultrasound.This aspirated blood. I then placed the wire. This was confirmed by fluoroscopy and ultrasound to be in the correct position. I created a pocket on the right chest removing the old scar.  I tunneled the line between the 2 sites. I then dilated the tract and placed the dilator assembly with the sheath. This was done under fluoroscopy. I then removed the sheath and dilator. The wire was also removed. The line was then pulled back to be in the venacava. I hooked this up to the port. I sutured this into place with 2-0 Prolene in 2 places. This aspirated blood and flushed easily.This was confirmed with a final fluoroscopy. I then closed this with 2-0 Vicryl and 4-0 Monocryl.This withdrew blood and I placed heparin in it Dermabond was placed on both the incisions.A dressing was placed. She tolerated this well and was transferred to the recovery room in stable condition

## 2017-03-23 NOTE — Interval H&P Note (Signed)
History and Physical Interval Note:  03/23/2017 9:08 AM  Kelsey Henry  has presented today for surgery, with the diagnosis of RIGHT BREAST CANCER  The various methods of treatment have been discussed with the patient and family. After consideration of risks, benefits and other options for treatment, the patient has consented to  Procedure(s): BREAST LUMPECTOMY WITH RADIOACTIVE SEED AND SENTINEL LYMPH NODE BIOPSY (Right) INSERTION PORT-A-CATH (Right) as a surgical intervention .  The patient's history has been reviewed, patient examined, no change in status, stable for surgery.  I have reviewed the patient's chart and labs.  Questions were answered to the patient's satisfaction.     Rolm Bookbinder

## 2017-03-23 NOTE — Discharge Instructions (Signed)
[facility] °Office Phone Number [phone] °POST OP INSTRUCTIONS ° °Always review your discharge instruction sheet given to you by the facility where your surgery was performed. ° °IF YOU HAVE DISABILITY OR FAMILY LEAVE FORMS, YOU MUST BRING THEM TO THE OFFICE FOR PROCESSING.  DO NOT GIVE THEM TO YOUR DOCTOR. ° °1. A prescription for pain medication may be given to you upon discharge.  Take your pain medication as prescribed, if needed.  If narcotic pain medicine is not needed, then you may take acetaminophen (Tylenol), naprosyn (Alleve) or ibuprofen (Advil) as needed. °2. Take your usually prescribed medications unless otherwise directed °3. If you need a refill on your pain medication, please contact your pharmacy.  They will contact our office to request authorization.  Prescriptions will not be filled after 5pm or on week-ends. °4. You should eat very light the first 24 hours after surgery, such as soup, crackers, pudding, etc.  Resume your normal diet the day after surgery. °5. Most patients will experience some swelling and bruising in the breast.  Ice packs and a good support bra will help.  Wear the breast binder provided or a sports bra for 72 hours day and night.  After that wear a sports bra during the day until you return to the office. Swelling and bruising can take several days to resolve.  °6. It is common to experience some constipation if taking pain medication after surgery.  Increasing fluid intake and taking a stool softener will usually help or prevent this problem from occurring.  A mild laxative (Milk of Magnesia or Miralax) should be taken according to package directions if there are no bowel movements after 48 hours. °7. Unless discharge instructions indicate otherwise, you may remove your bandages 48 hours after surgery and you may shower at that time.  You may have steri-strips (small skin tapes) in place directly over the incision.  These strips should be left on the  skin for 7-10 days and will come off on their own.  If your surgeon used skin glue on the incision, you may shower in 24 hours.  The glue will flake off over the next 2-3 weeks.  Any sutures or staples will be removed at the office during your follow-up visit. °8. ACTIVITIES:  You may resume regular daily activities (gradually increasing) beginning the next day.  Wearing a good support bra or sports bra minimizes pain and swelling.  You may have sexual intercourse when it is comfortable. °a. You may drive when you no longer are taking prescription pain medication, you can comfortably wear a seatbelt, and you can safely maneuver your car and apply brakes. °b. RETURN TO WORK:  ______________________________________________________________________________________ °9. You should see your doctor in the office for a follow-up appointment approximately two weeks after your surgery.  Your doctor’s nurse will typically make your follow-up appointment when she calls you with your pathology report.  Expect your pathology report 3-4 business days after your surgery.  You may call to check if you do not hear from us after three days. °10. OTHER INSTRUCTIONS: _______________________________________________________________________________________________ _____________________________________________________________________________________________________________________________________ °_____________________________________________________________________________________________________________________________________ °_____________________________________________________________________________________________________________________________________ ° °WHEN TO CALL DR Breeanna Galgano: °1. Fever over 101.0 °2. Nausea and/or vomiting. °3. Extreme swelling or bruising. °4. Continued bleeding from incision. °5. Increased pain, redness, or drainage from the incision. ° °The clinic staff is available to answer your questions during regular  business hours.  Please don’t hesitate to call and ask to speak to one of the nurses for clinical concerns.  If you   have a medical emergency, go to the nearest emergency room or call 911.  A surgeon from [facility] is always on call at the hospital. ° °For further questions, please visit [email or web address] ° °

## 2017-03-24 ENCOUNTER — Encounter (HOSPITAL_COMMUNITY): Payer: Self-pay | Admitting: General Surgery

## 2017-03-26 ENCOUNTER — Telehealth: Payer: Self-pay | Admitting: Oncology

## 2017-03-26 NOTE — Telephone Encounter (Signed)
03/26/17 successfully faxed FMLA paperwork to [employer] @ [phone] @ 10:35 am.  Also spoke with patient @ [phone] @ 10:46 am  to inform her that her forms were faxed and ready for pick-up.  They will be located at front desk reception area.

## 2017-03-27 ENCOUNTER — Telehealth: Payer: Self-pay

## 2017-03-27 NOTE — Telephone Encounter (Signed)
FMLA papers picked up by pt's husband, Kelsey Henry

## 2017-03-30 DIAGNOSIS — C50311 Malignant neoplasm of lower-inner quadrant of right female breast: Secondary | ICD-10-CM | POA: Diagnosis not present

## 2017-03-31 ENCOUNTER — Other Ambulatory Visit: Payer: BLUE CROSS/BLUE SHIELD

## 2017-03-31 ENCOUNTER — Ambulatory Visit: Payer: BLUE CROSS/BLUE SHIELD

## 2017-04-02 ENCOUNTER — Encounter: Payer: Self-pay | Admitting: Medical Oncology

## 2017-04-03 ENCOUNTER — Telehealth: Payer: Self-pay | Admitting: Oncology

## 2017-04-03 ENCOUNTER — Ambulatory Visit (HOSPITAL_BASED_OUTPATIENT_CLINIC_OR_DEPARTMENT_OTHER): Payer: BLUE CROSS/BLUE SHIELD | Admitting: Oncology

## 2017-04-03 VITALS — HR 76 | Temp 98.4°F | Resp 17 | Ht 61.0 in | Wt 128.2 lb

## 2017-04-03 DIAGNOSIS — C50311 Malignant neoplasm of lower-inner quadrant of right female breast: Secondary | ICD-10-CM | POA: Diagnosis not present

## 2017-04-03 DIAGNOSIS — Z171 Estrogen receptor negative status [ER-]: Secondary | ICD-10-CM

## 2017-04-03 MED ORDER — LORAZEPAM 0.5 MG PO TABS: 0.5000 mg | ORAL_TABLET | Freq: Every evening | ORAL | 0 refills | Status: DC | PRN

## 2017-04-03 MED ORDER — LIDOCAINE-PRILOCAINE 2.5-2.5 % EX CREA: TOPICAL_CREAM | CUTANEOUS | 3 refills | Status: DC

## 2017-04-03 MED ORDER — PROCHLORPERAZINE MALEATE 10 MG PO TABS: 10.0000 mg | ORAL_TABLET | Freq: Four times a day (QID) | ORAL | 1 refills | Status: DC | PRN

## 2017-04-03 MED ORDER — DEXAMETHASONE 4 MG PO TABS: ORAL_TABLET | ORAL | 1 refills | Status: DC

## 2017-04-03 NOTE — Telephone Encounter (Signed)
Gave patient avs and calendar with appts per 12/21 los.  °

## 2017-04-03 NOTE — Progress Notes (Signed)
[facility]  [phone] [phone]     ID: Kelsey Henry DOB: 07-13-1965  MR#: [id number]  IOM#:[id number]  Patient Care Team: Patient, No Pcp Per as PCP - General (General Practice) Magrinat, Virgie Dad, MD as Consulting Physician (Oncology) Kyung Rudd, MD as Consulting Physician (Radiation Oncology) Rolm Bookbinder, MD as Consulting Physician (General Surgery) Meisinger, Sherren Mocha, MD as Consulting Physician (Obstetrics and Gynecology) Loletta Specter, MD (Unknown Physician Specialty) OTHER MD:  CHIEF COMPLAINT: triple negative breast cancer  CURRENT TREATMENT: adjuvant chemotherapy   HISTORY OF CURRENT ILLNESS: From the original intake note:  Kelsey Henry had routine screening mammography October 2018 showing a possible right breast mass.  She was scheduled for unilateral right mammography and tomography with right breast ultrasonography at the breast center February 06, 2017.  This showed the breast density to be category D.  In the lower inner quadrant of the right breast there was a 0.6 cm oval mass, which by ultrasound measured 0.9 cm and was irregular and hypoechoic.  Ultrasound of the right axilla was sonographically benign.  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  The patient's subsequent history is as detailed below.  INTERVAL HISTORY: Kelsey Henry returns today for a follow-up and treatment of her triple negative breast cancer. She is accompanied by her husband.  On 02/26/2017, she underwent a Bilateral Breast MRI with and without contrast, breast composition category C, which showed known malignancy at 5 o'clock in the right breast and two indeterminate masses. One mass is 5.2 x 6 mm in the lateral inferior  right breast and the other measures up to 6 mm at 6 o'clock in the left breast. On 03/04/2017, she underwent a Bilateral Breast MRI guided core needle biopsies which revealed benign tissue   On 03/23/2017 she proceeded to right lumpectomy and sentinel lymph node sampling.  The final pathology ([facility] [id number]) confirmed an invasive ductal carcinoma, grade 3, measuring 0.8 cm.  Margins were negative.  Both sentinel lymph nodes were clear.  The repeat prognostic panel showed the tumor to be again triple negative, with an MIB-1 of 70%.    REVIEW OF SYSTEMS: Kelsey Henry is great and adds that she tolerated surgery well. She splits her time resting walking and doing squats. She denies unusual headaches, visual changes, nausea, vomiting, or dizziness. There has been no unusual cough, phlegm production, or pleurisy. This been no change in bowel or bladder habits. She denies unexplained fatigue or unexplained weight loss, bleeding, rash, or fever. A detailed review of systems was otherwise entirely stable.    PAST MEDICAL HISTORY: Past Medical History:  Diagnosis Date  . Cancer [facility])    breast  . Genetic testing 02/19/2017   STAT Breast panel with reflext to Multi-Cancer panel (83 genes) @ [facility] - No pathogenic mutations detected  . GERD (gastroesophageal reflux disease)    no meds  . Seasonal allergies   . SVD (spontaneous vaginal delivery)    x 1    PAST SURGICAL HISTORY: Past Surgical History:  Procedure Laterality Date  . BILATERAL SALPINGECTOMY Bilateral 06/23/2012   Procedure: BILATERAL SALPINGECTOMY;  Surgeon: Cheri Fowler, MD;  Location: [facility];  Service: Gynecology;  Laterality: Bilateral;  . BREAST LUMPECTOMY WITH RADIOACTIVE SEED AND SENTINEL LYMPH NODE BIOPSY Right 03/23/2017   Procedure: BREAST  LUMPECTOMY WITH RADIOACTIVE SEED AND SENTINEL LYMPH NODE BIOPSY;  Surgeon: Rolm Bookbinder, MD;  Location: [place];  Service: General;  Laterality: Right;  . LAPAROSCOPIC SUPRACERVICAL  HYSTERECTOMY N/A 06/23/2012   Procedure: LAPAROSCOPIC SUPRACERVICAL HYSTERECTOMY;  Surgeon: Cheri Fowler, MD;  Location: [facility];  Service: Gynecology;  Laterality: N/A;  . PORTACATH PLACEMENT Right 03/23/2017   Procedure: INSERTION PORT-A-CATH;  Surgeon: Rolm Bookbinder, MD;  Location: [facility];  Service: General;  Laterality: Right;  . TUBAL LIGATION    . VULVAR LESION REMOVAL N/A 11/01/2014   Procedure: MONS PUBIS LESION;  Surgeon: Cheri Fowler, MD;  Location: [facility];  Service: Gynecology;  Laterality: N/A;  local anesthesia with IV sedation if needed  . WISDOM TOOTH EXTRACTION      FAMILY HISTORY No family history on file.The patient has no information regarding her father or his side of the family.  The patient's mother is 65 years old as of November 2018.  The patient has 3 brothers, 2 sisters.  One sister had 2 "benign tumors" removed from the right breast at the age of 107.  A maternal grandfather had prostate cancer in his 37s.  GYNECOLOGIC HISTORY:  Patient's last menstrual period was 05/26/2012. Menarche age 76, first live birth age 66, the patient is GX P1.  She stopped having periods 2012 when she underwent hysterectomy, without salpingo-oophorectomy..  She used hormone replacement for approximately 3 months.  She is used oral contraceptives remotely with no complications.  SOCIAL HISTORY:  Kelsey Henry works as an Psychologist, educational for [employer], in the paternal DNA subsection.  Her husband Kelsey Henry is an Clinical biochemist.  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  The patient has no grandchildren.  She is a Psychologist, forensic.    ADVANCED DIRECTIVES: Not in place   HEALTH MAINTENANCE: Social History   Tobacco Use  . Smoking status: Never Smoker  . Smokeless tobacco: Never Used  Substance Use Topics  . Alcohol use: No  . Drug use: No     Colonoscopy: n/a  PAP: Status post hysterectomy  Bone density: n/a   Allergies  Allergen Reactions  . Aspirin Hives    Current  Outpatient Medications  Medication Sig Dispense Refill  . Multiple Vitamins-Minerals (MULTIVITAMIN ADULT) CHEW Chew 2 % by mouth.    . oxyCODONE (OXY IR/ROXICODONE) 5 MG immediate release tablet Take 1 tablet (5 mg total) by mouth every 6 (six) hours as needed for severe pain. 10 tablet 0   No current facility-administered medications for this visit.     OBJECTIVE: Middle-aged African-American woman in no acute distress  Vitals:   04/03/17 1324  Pulse: 76  Resp: 17  Temp: 98.4 F (36.9 C)  SpO2: 100%     Body mass index is 24.22 kg/m.   Wt Readings from Last 3 Encounters:  04/03/17 128 lb 3.2 oz (58.2 kg)  03/17/17 127 lb (57.6 kg)  02/18/17 127 lb 8 oz (57.8 kg)      ECOG FS:0 - Asymptomatic  Sclerae unicteric, pupils round and equal Oropharynx clear and moist No cervical or supraclavicular adenopathy Lungs no rales or rhonchi Heart regular rate and rhythm Abd soft, nontender, positive bowel sounds MSK no focal spinal tenderness, no upper extremity lymphedema Neuro: nonfocal, well oriented, appropriate affect Breasts: The right breast is status post lumpectomy.  The inframammary incision is not visible by simple inspection and the cosmetic result is excellent.  The incision is healing without erythema or swelling.  The left breast  is benign.  Both axillae are benign.  LAB RESULTS:  CMP     Component Value Date/Time   NA 142 02/18/2017 1221   K 4.6 02/18/2017 1221   CO2 27 02/18/2017 1221   GLUCOSE 95 02/18/2017 1221   BUN 18.4 02/18/2017 1221   CREATININE 1.0 02/18/2017 1221   CALCIUM 10.3 02/18/2017 1221   PROT 8.3 02/18/2017 1221   ALBUMIN 4.4 02/18/2017 1221   AST 22 02/18/2017 1221   ALT 18 02/18/2017 1221   ALKPHOS 89 02/18/2017 1221   BILITOT 0.48 02/18/2017 1221    No results found for: TOTALPROTELP, ALBUMINELP, A1GS, A2GS, BETS, BETA2SER, GAMS, MSPIKE, SPEI  No results found for: Nils Pyle, [facility]  Lab Results  Component Value  Date   WBC 7.3 03/23/2017   NEUTROABS 4.4 02/18/2017   HGB 13.7 03/23/2017   HCT 40.3 03/23/2017   MCV 88.8 03/23/2017   PLT 244 03/23/2017      Chemistry      Component Value Date/Time   NA 142 02/18/2017 1221   K 4.6 02/18/2017 1221   CO2 27 02/18/2017 1221   BUN 18.4 02/18/2017 1221   CREATININE 1.0 02/18/2017 1221      Component Value Date/Time   CALCIUM 10.3 02/18/2017 1221   ALKPHOS 89 02/18/2017 1221   AST 22 02/18/2017 1221   ALT 18 02/18/2017 1221   BILITOT 0.48 02/18/2017 1221       No results found for: LABCA2  No components found for: RAQTMA263  No results for input(s): INR in the last 168 hours.  No results found for: LABCA2  No results found for: FHL456  No results found for: YBW389  No results found for: HTD428  No results found for: CA2729  No components found for: HGQUANT  No results found for: CEA1 / No results found for: CEA1   No results found for: AFPTUMOR  No results found for: CHROMOGRNA  No results found for: PSA1  No visits with results within 3 Day(s) from this visit.  Latest known visit with results is:  Admission on 03/23/2017, Discharged on 03/23/2017  Component Date Value Ref Range Status  . WBC 03/23/2017 7.3  4.0 - 10.5 K/uL Final  . RBC 03/23/2017 4.54  3.87 - 5.11 MIL/uL Final  . Hemoglobin 03/23/2017 13.7  12.0 - 15.0 g/dL Final  . HCT 03/23/2017 40.3  36.0 - 46.0 % Final  . MCV 03/23/2017 88.8  78.0 - 100.0 fL Final  . MCH 03/23/2017 30.2  26.0 - 34.0 pg Final  . MCHC 03/23/2017 34.0  30.0 - 36.0 g/dL Final  . RDW 03/23/2017 12.4  11.5 - 15.5 % Final  . Platelets 03/23/2017 244  150 - 400 K/uL Final    (this displays the last labs from the last 3 days)  No results found for: TOTALPROTELP, ALBUMINELP, A1GS, A2GS, BETS, BETA2SER, GAMS, MSPIKE, SPEI (this displays SPEP labs)  No results found for: KPAFRELGTCHN, LAMBDASER, KAPLAMBRATIO (kappa/lambda light chains)  No results found for: HGBA, HGBA2QUANT,  HGBFQUANT, HGBSQUAN (Hemoglobinopathy evaluation)   No results found for: LDH  No results found for: IRON, TIBC, IRONPCTSAT (Iron and TIBC)  No results found for: FERRITIN  Urinalysis No results found for: COLORURINE, APPEARANCEUR, LABSPEC, PHURINE, GLUCOSEU, HGBUR, BILIRUBINUR, KETONESUR, PROTEINUR, UROBILINOGEN, NITRITE, LEUKOCYTESUR   STUDIES: Nm Sentinel Node Inj-no Rpt (breast)  Result Date: 03/23/2017 Sulfur colloid was injected by the nuclear medicine technologist for melanoma sentinel node.   Mm Breast Surgical Specimen  Result Date: 03/23/2017 CLINICAL  DATA:  Right breast cancer status post right lumpectomy. EXAM: SPECIMEN RADIOGRAPH OF THE RIGHT BREAST COMPARISON:  Previous exam(s). FINDINGS: Status post excision of the right breast. The radioactive seed and ribbon biopsy marker clip are present, completely intact, and were marked for pathology. IMPRESSION: Specimen radiograph of the right breast. Electronically Signed   By: Abelardo Diesel M.D.   On: 03/23/2017 10:13   Dg Chest Port 1 View  Result Date: 03/23/2017 CLINICAL DATA:  Port-A-Cath insertion EXAM: PORTABLE CHEST 1 VIEW COMPARISON:  03/06/2015 FINDINGS: Right jugular Port-A-Cath has been placed. Tip is in the upper SVC. Heart is normal in size. Clear lungs. No pneumothorax. IMPRESSION: Right jugular Port-A-Cath placement as described. Tip is in the upper SVC. Electronically Signed   By: Marybelle Killings M.D.   On: 03/23/2017 12:00   Dg Fluoro Guide Cv Line-no Report  Result Date: 03/23/2017 Fluoroscopy was utilized by the requesting physician.  No radiographic interpretation.   Mm Rt Radioactive Seed Loc Mammo Guide  Result Date: 03/20/2017 CLINICAL DATA:  Right breast cancer for radioactive seed placement prior to right breast surgery. EXAM: MAMMOGRAPHIC GUIDED RADIOACTIVE SEED LOCALIZATION OF THE RIGHT BREAST COMPARISON:  Previous exam(s). FINDINGS: Patient presents for radioactive seed localization prior to right  breast surgery. I met with the patient and we discussed the procedure of seed localization including benefits and alternatives. We discussed the high likelihood of a successful procedure. We discussed the risks of the procedure including infection, bleeding, tissue injury and further surgery. We discussed the low dose of radioactivity involved in the procedure. Informed, written consent was given. The usual time-out protocol was performed immediately prior to the procedure. Using mammographic guidance, sterile technique, 1% lidocaine and an I-125 radioactive seed, ribbon biopsy clip right breast was localized using a medial approach. The follow-up mammogram images confirm the seed 0.5 cm inferior to the ribbon biopsy clip and were marked for Dr. Donne Hazel. Follow-up survey of the patient confirms presence of the radioactive seed. Order number of I-125 seed:  [id number]. Total activity: 7.948 millicuries Reference Date: February 26, 2017 The patient tolerated the procedure well and was released from the [facility]. She was given instructions regarding seed removal. IMPRESSION: Radioactive seed localization right breast. No apparent complications. Electronically Signed   By: Abelardo Diesel M.D.   On: 03/20/2017 11:24    ELIGIBLE FOR AVAILABLE RESEARCH PROTOCOL: UPBEAT  ASSESSMENT: 51 y.o. [place] woman status post right breast lower inner quadrant biopsy February 09, 2017 for a clinical T1b N0, stage IB invasive ductal carcinoma, grade 3, triple negative  (1) status post right lumpectomy 03/23/2017 for a pT1b pN0, stage IB invasive ductal carcinoma, grade 3, with negative margins.  (2) adjuvant chemotherapy consisting of cyclophosphamide and doxorubicin in dose dense fashion x4 to start 04/28/2017, followed by weekly paclitaxel x12  (does not qualify for BR003)  (a) echocardiogram 02/25/2017 showed a baseline ejection fraction in the 60-65%  (3) adjuvant radiation to follow  (4) genetics testing  02/19/2017 through the STAT Breast panel with reflext to Multi-Cancer panel (83 genes) @ [facility] - No pathogenic mutations detected in ALK, APC, ATM, AXIN2, BAP1, BARD1, BLM, BMPR1A, BRCA1, BRCA2, BRIP1, CASR, CDC73, CDH1, CDK4, CDKN1B, CDKN1C, CDKN2A, CEBPA, CHEK2, CTNNA1, DICER1, DIS3L2, EGFR, EPCAM, FH, FLCN, GATA2, GPC3, GREM1, HOXB13, HRAS, KIT, MAX, MEN1, MET, MITF, MLH1, MSH2, MSH3, MSH6, MUTYH, NBN, NF1, NF2, NTHL1, PALB2, PDGFRA, PHOX2B, PMS2, POLD1, POLE, POT1, PRKAR1A, PTCH1, PTEN, RAD50, RAD51C, RAD51D, RB1, RECQL4, RET, RUNX1, SDHA, SDHAF2, SDHB, SDHC,  SDHD, SMAD4, SMARCA4, SMARCB1, SMARCE1, STK11, SUFU, TERC, TERT, TMEM127, TP53, TSC1, TSC2, VHL, WRN, WT1.  (a) Variants of Uncertain Significance in PDGFRA c.3040G>A (p.Ala1014Thr) and SMARCA4 c.2044C>G (p.Leu682Val) noted  PLAN: Kelsey Henry did remarkably well with her surgery and she is now ready to proceed to systemic therapy.  Since her cancer is triple negative the only choice we have is chemotherapy and we again discussed cyclophosphamide plus doxorubicin to be followed by weekly paclitaxel.  She has also met with our chemotherapy teaching nurse and had many well informed questions.  We reviewed the possible toxicity side effects and complications of these agents.  She would like to start 04/28/2017.  Today I gave her a copy of the "roadmap" on how to take her supportive medications and all the prescriptions were put in.  She will return for her first cycle and then see me again a week later.  I have asked her to keep a diary of side effects so we can troubleshoot these more easily and make her subsequent cycles easier for her.  She is planning to get back to exercising.  I encouraged that but suggested for now she mostly do lower body exercises and then after she meets with her surgeon get clearance for upper body.  She knows to call for any other problems that may develop before her next visit.   Magrinat, Virgie Dad, MD  04/03/17 2:02  PM Medical Oncology and Hematology [facility] [address] [place], [place] [address] Tel. [phone]    Fax. [phone]  This document serves as a record of services personally performed by Chauncey Cruel, MD. It was created on his behalf by Margit Banda, a trained medical scribe. The creation of this record is based on the scribe's personal observations and the provider's statements to them.  I have reviewed the above documentation for accuracy and completeness, and I agree with the above.

## 2017-04-06 ENCOUNTER — Other Ambulatory Visit: Payer: Self-pay | Admitting: *Deleted

## 2017-04-06 DIAGNOSIS — C50311 Malignant neoplasm of lower-inner quadrant of right female breast: Secondary | ICD-10-CM

## 2017-04-06 DIAGNOSIS — Z171 Estrogen receptor negative status [ER-]: Principal | ICD-10-CM

## 2017-04-06 MED ORDER — PROCHLORPERAZINE MALEATE 10 MG PO TABS: 10.0000 mg | ORAL_TABLET | Freq: Four times a day (QID) | ORAL | 1 refills | Status: DC | PRN

## 2017-04-13 ENCOUNTER — Ambulatory Visit: Payer: BLUE CROSS/BLUE SHIELD

## 2017-04-13 ENCOUNTER — Other Ambulatory Visit: Payer: BLUE CROSS/BLUE SHIELD

## 2017-04-23 ENCOUNTER — Ambulatory Visit: Payer: BLUE CROSS/BLUE SHIELD | Attending: General Surgery | Admitting: Physical Therapy

## 2017-04-23 ENCOUNTER — Encounter: Payer: Self-pay | Admitting: Physical Therapy

## 2017-04-23 DIAGNOSIS — Z483 Aftercare following surgery for neoplasm: Secondary | ICD-10-CM | POA: Insufficient documentation

## 2017-04-23 DIAGNOSIS — Z171 Estrogen receptor negative status [ER-]: Secondary | ICD-10-CM | POA: Insufficient documentation

## 2017-04-23 DIAGNOSIS — C50311 Malignant neoplasm of lower-inner quadrant of right female breast: Secondary | ICD-10-CM | POA: Insufficient documentation

## 2017-04-23 NOTE — Therapy (Signed)
[place], [place], [address] Phone: [phone]   Fax:  [phone]  Physical Therapy Treatment  Patient Details  Name: Kelsey Henry MRN: [id number] Date of Birth: October 12, 1965 Referring Provider: Dr. Rolm Bookbinder   Encounter Date: 04/23/2017  PT End of Session - 04/23/17 0914    Visit Number  2    Number of Visits  2    PT Start Time  0840    PT Stop Time  0916    PT Time Calculation (min)  36 min    Activity Tolerance  Patient tolerated treatment well    Behavior During Therapy  [facility] for tasks assessed/performed       Past Medical History:  Diagnosis Date  . [facility])    breast  . Genetic testing 02/19/2017   STAT Breast panel with reflext to Multi-Cancer panel (83 genes) @ [facility] - No pathogenic mutations detected  . GERD (gastroesophageal reflux disease)    no meds  . Seasonal allergies   . SVD (spontaneous vaginal delivery)    x 1    Past Surgical History:  Procedure Laterality Date  . BILATERAL SALPINGECTOMY Bilateral 06/23/2012   Procedure: BILATERAL SALPINGECTOMY;  Surgeon: Cheri Fowler, MD;  Location: [facility];  Service: Gynecology;  Laterality: Bilateral;  . BREAST LUMPECTOMY WITH RADIOACTIVE SEED AND SENTINEL LYMPH NODE BIOPSY Right 03/23/2017   Procedure: BREAST LUMPECTOMY WITH RADIOACTIVE SEED AND SENTINEL LYMPH NODE BIOPSY;  Surgeon: Rolm Bookbinder, MD;  Location: [place];  Service: General;  Laterality: Right;  . LAPAROSCOPIC SUPRACERVICAL HYSTERECTOMY N/A 06/23/2012   Procedure: LAPAROSCOPIC SUPRACERVICAL HYSTERECTOMY;  Surgeon: Cheri Fowler, MD;  Location: [facility];  Service: Gynecology;  Laterality: N/A;  . PORTACATH PLACEMENT Right 03/23/2017   Procedure: INSERTION PORT-A-CATH;  Surgeon: Rolm Bookbinder, MD;  Location: [facility];  Service: General;  Laterality: Right;  . TUBAL LIGATION    . VULVAR LESION REMOVAL N/A 11/01/2014   Procedure: MONS PUBIS LESION;   Surgeon: Cheri Fowler, MD;  Location: [facility];  Service: Gynecology;  Laterality: N/A;  local anesthesia with IV sedation if needed  . WISDOM TOOTH EXTRACTION      There were no vitals filed for this visit.  Subjective Assessment - 04/23/17 0841    Subjective  Patient reports she underwent a right lumpectomy and sentinel node biopsy (2 negative nodes) on 03/23/17. She begins chemotherapy 04/28/17 and then will undergo radiation.    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 It measured 8 mm and is located in the lower inner quadrant. She reports that she has lost 13 pounds in the past month for unknown reasons but has no other health problems. She underwent a right lumpectomy and sentinel node biopsy (2 negative nodes) on 03/23/17.     Patient Stated Goals  Make sure arm is ok    Currently in Pain?  Yes    Pain Score  1     Pain Location  Breast    Pain Orientation  Right    Pain Descriptors / Indicators  Sore    Pain Type  Acute pain    Pain Onset  Yesterday    Pain Frequency  Intermittent    Aggravating Factors   Exercising with weights    Pain Relieving Factors  Nothing    Multiple Pain Sites  No         [facility] PT Assessment - 04/23/17 0001  Assessment   Medical Diagnosis  s/p right lumpectomy and SLNB    Referring Provider  Dr. Rolm Bookbinder    Onset Date/Surgical Date  03/23/17    Hand Dominance  Right    Prior Therapy  Baseline assessment      Precautions   Precautions  Other (comment)    Precaution Comments  recent breast surgery      Restrictions   Weight Bearing Restrictions  No      Balance Screen   Has the patient fallen in the past 6 months  No    Has the patient had a decrease in activity level because of a fear of falling?   No    Is the patient reluctant to leave their home because of a fear of falling?   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    Leisure  Does cardio, yoga, walks, and lifts weights daily      Cognition   Overall Cognitive Status  Within Functional Limits for tasks assessed      Posture/Postural Control   Posture/Postural Control  No significant limitations      ROM / Strength   AROM / PROM / Strength  AROM;Strength      AROM   AROM Assessment Site  Shoulder    Right/Left Shoulder  Right    Right Shoulder Extension  50 Degrees    Right Shoulder Flexion  153 Degrees    Right Shoulder ABduction  161 Degrees    Right Shoulder Internal Rotation  58 Degrees    Right Shoulder External Rotation  80 Degrees      Strength   Overall Strength Comments  Right UE 5/5      Palpation   Palpation comment  Inferior breast incision and axillary incisions both appear well healed without redness or irritation.        LYMPHEDEMA/ONCOLOGY QUESTIONNAIRE - 04/23/17 0900      Right Upper Extremity Lymphedema   10 cm Proximal to Olecranon Process  26.6 cm    Olecranon Process  23.5 cm    10 cm Proximal to Ulnar Styloid Process  20.9 cm    Just Proximal to Ulnar Styloid Process  14 cm    Across Hand at [employer]  17.9 cm    At [place] of 2nd Digit  5.5 cm      Left Upper Extremity Lymphedema   10 cm Proximal to Olecranon Process  25.7 cm    Olecranon Process  22.5 cm    10 cm Proximal to Ulnar Styloid Process  19.7 cm    Just Proximal to Ulnar Styloid Process  13.5 cm    Across Hand at [employer]  17.6 cm    At [place] of 2nd Digit  5.3 cm        Quick Dash - 04/23/17 0001    Open a tight or new jar  No difficulty    Do heavy household chores (wash walls, wash floors)  No difficulty    Carry a shopping bag or briefcase  No difficulty     Wash your back  No  difficulty    Use a knife to cut food  No difficulty    Recreational activities in which you take some force or impact through your arm, shoulder, or hand (golf, hammering, tennis)  No difficulty    During the past week, to what extent has your arm, shoulder or hand problem interfered with your normal social activities with family, friends, neighbors, or groups?  Not at all    During the past week, to what extent has your arm, shoulder or hand problem limited your work or other regular daily activities  Not at all    Arm, shoulder, or hand pain.  None    Tingling (pins and needles) in your arm, shoulder, or hand  None    Difficulty Sleeping  No difficulty    DASH Score  0 %                    PT Education - 04/23/17 0911    Education provided  Yes    Education Details  Info issued on Prehab exericse class and ABC Class; education on what exercises to do and others to avoid related to lymphedema    Person(s) Educated  Patient    Methods  Explanation;Handout    Comprehension  Verbalized understanding           Breast Clinic Goals - 02/18/17 2110      Patient will be able to verbalize understanding of pertinent lymphedema risk reduction practices relevant to her diagnosis specifically related to skin care.   Time  1    Period  Days    Status  Achieved      Patient will be able to return demonstrate and/or verbalize understanding of the post-op home exercise program related to regaining shoulder range of motion.   Time  1    Period  Days    Status  Achieved      Patient will be able to verbalize understanding of the importance of attending the postoperative After Breast Cancer Class for further lymphedema risk reduction education and therapeutic exercise.   Time  1    Period  Days    Status  Achieved           Plan - 04/23/17 0915    Clinical Impression Statement  Patient is doing very well sicne her right lumpectomy and sentinel node  biopsy. She has returned to exercising with some restrictions related to her incisions healing and was educated on exercises to do and others to avoid today. She begins chemotherapy next week. She was educated on various recommendations for better tolerating chemo such as yoga, Tai Chi, regular walking, and healthy eating. She will consider going to the weekly Prehab exercise class beginning next week. She has regainde full shoulder ROM and function and has no other PT needs at this time.    Rehab Potential  Excellent    Clinical Impairments Affecting Rehab Potential  none    PT Treatment/Interventions  ADLs/Self Care Home Management;Therapeutic exercise;Patient/family education    PT Next Visit Plan  D/C as pt is doing well.    Consulted and Agree with Plan of Care  Patient       Patient will benefit from skilled therapeutic intervention in order to improve the following deficits and impairments:  Pain, Impaired UE functional use, Decreased knowledge of precautions, Decreased range of motion  Visit Diagnosis: Malignant neoplasm of lower-inner quadrant of right breast of female, estrogen receptor negative ([place])  Aftercare following  surgery for neoplasm     Problem List Patient Active Problem List   Diagnosis Date Noted  . Genetic testing 02/19/2017  . Malignant neoplasm of lower-inner quadrant of right breast of female, estrogen receptor negative ([place]) 02/17/2017  . Vulvar lesion 11/01/2014  . Menorrhagia 06/23/2012  . Dysmenorrhea 06/23/2012    PHYSICAL THERAPY DISCHARGE SUMMARY  Visits from Start of Care: 2  Current functional level related to goals / functional outcomes: Goals met; has returned to baseline with shoulder function and ROM. No signs of lymphedema.   Remaining deficits: None   Education / Equipment: Lymphedema risk reduction information and will attend ABC class on 05/04/17.  Plan: Patient agrees to discharge.  Patient goals were met. Patient is being  discharged due to meeting the stated rehab goals.  ?????     Annia Friendly, [place] 04/23/17 9:31 AM  [place], [place], [address] Phone: [phone]   Fax:  [phone]  Name: Minda Faas MRN: [id number] Date of Birth: 09-20-1965

## 2017-04-27 ENCOUNTER — Other Ambulatory Visit: Payer: Self-pay | Admitting: *Deleted

## 2017-04-27 DIAGNOSIS — Z171 Estrogen receptor negative status [ER-]: Principal | ICD-10-CM

## 2017-04-27 DIAGNOSIS — C50311 Malignant neoplasm of lower-inner quadrant of right female breast: Secondary | ICD-10-CM

## 2017-04-28 ENCOUNTER — Inpatient Hospital Stay: Payer: BLUE CROSS/BLUE SHIELD

## 2017-04-28 ENCOUNTER — Inpatient Hospital Stay: Payer: BLUE CROSS/BLUE SHIELD | Attending: Oncology

## 2017-04-28 ENCOUNTER — Other Ambulatory Visit: Payer: Self-pay | Admitting: Oncology

## 2017-04-28 ENCOUNTER — Encounter: Payer: Self-pay | Admitting: *Deleted

## 2017-04-28 VITALS — BP 123/84 | HR 88 | Temp 98.8°F | Resp 16

## 2017-04-28 DIAGNOSIS — Z171 Estrogen receptor negative status [ER-]: Principal | ICD-10-CM

## 2017-04-28 DIAGNOSIS — Z5111 Encounter for antineoplastic chemotherapy: Secondary | ICD-10-CM | POA: Insufficient documentation

## 2017-04-28 DIAGNOSIS — C50311 Malignant neoplasm of lower-inner quadrant of right female breast: Secondary | ICD-10-CM

## 2017-04-28 DIAGNOSIS — Z5189 Encounter for other specified aftercare: Secondary | ICD-10-CM | POA: Diagnosis not present

## 2017-04-28 DIAGNOSIS — K219 Gastro-esophageal reflux disease without esophagitis: Secondary | ICD-10-CM | POA: Insufficient documentation

## 2017-04-28 LAB — CMP (CANCER CENTER ONLY)
ALBUMIN: 4.7 g/dL (ref 3.5–5.0)
ALT: 26 U/L (ref 0–55)
ANION GAP: 9 (ref 3–11)
AST: 26 U/L (ref 5–34)
Alkaline Phosphatase: 97 U/L (ref 40–150)
BUN: 15 mg/dL (ref 7–26)
CHLORIDE: 103 mmol/L (ref 98–109)
CO2: 28 mmol/L (ref 22–29)
Calcium: 10.5 mg/dL — ABNORMAL HIGH (ref 8.4–10.4)
Creatinine: 0.98 mg/dL (ref 0.60–1.10)
GFR, Estimated: >60 mL/min (ref >60)
GLUCOSE: 87 mg/dL (ref 70–140)
Potassium: 4.5 mmol/L (ref 3.3–4.7)
SODIUM: 140 mmol/L (ref 136–145)
Total Bilirubin: 0.5 mg/dL (ref 0.2–1.2)
Total Protein: 8.4 g/dL — ABNORMAL HIGH (ref 6.4–8.3)

## 2017-04-28 LAB — CBC WITH DIFFERENTIAL (CANCER CENTER ONLY)
Basophils Absolute: 0 10*3/uL (ref 0.0–0.1)
Basophils Relative: 0 %
Eosinophils Absolute: 0.1 10*3/uL (ref 0.0–0.5)
Eosinophils Relative: 1 %
HEMATOCRIT: 42.3 % (ref 34.8–46.6)
HEMOGLOBIN: 14.3 g/dL (ref 11.6–15.9)
LYMPHS ABS: 1.7 10*3/uL (ref 0.9–3.3)
Lymphocytes Relative: 24 %
MCH: 30.4 pg (ref 25.1–34.0)
MCHC: 33.8 g/dL (ref 31.5–36.0)
MCV: 90 fL (ref 79.5–101.0)
MONO ABS: 0.4 10*3/uL (ref 0.1–0.9)
Monocytes Relative: 5 %
NEUTROS ABS: 4.9 10*3/uL (ref 1.5–6.5)
NEUTROS PCT: 70 %
Platelet Count: 238 10*3/uL (ref 145–400)
RBC: 4.7 MIL/uL (ref 3.70–5.45)
RDW: 12.1 % (ref 11.2–16.1)
WBC Count: 7 10*3/uL (ref 3.9–10.3)

## 2017-04-28 MED ORDER — PEGFILGRASTIM 6 MG/0.6ML ~~LOC~~ PSKT: 6.0000 mg | PREFILLED_SYRINGE | Freq: Once | SUBCUTANEOUS | Status: DC

## 2017-04-28 MED ORDER — SODIUM CHLORIDE 0.9% FLUSH
10.0000 mL | INTRAVENOUS | Status: DC | PRN
  Administered 2017-04-28: 10 mL
  Filled 2017-04-28: qty 10

## 2017-04-28 MED ORDER — DOXORUBICIN HCL CHEMO IV INJECTION 2 MG/ML
60.0000 mg/m2 | Freq: Once | INTRAVENOUS | Status: AC
  Administered 2017-04-28: 94 mg via INTRAVENOUS
  Filled 2017-04-28: qty 47

## 2017-04-28 MED ORDER — SODIUM CHLORIDE 0.9 % IV SOLN
Freq: Once | INTRAVENOUS | Status: AC
  Administered 2017-04-28: 14:00:00 via INTRAVENOUS
  Filled 2017-04-28: qty 5

## 2017-04-28 MED ORDER — PALONOSETRON HCL INJECTION 0.25 MG/5ML
0.2500 mg | Freq: Once | INTRAVENOUS | Status: AC
  Administered 2017-04-28: 0.25 mg via INTRAVENOUS

## 2017-04-28 MED ORDER — SODIUM CHLORIDE 0.9 % IV SOLN
600.0000 mg/m2 | Freq: Once | INTRAVENOUS | Status: AC
  Administered 2017-04-28: 940 mg via INTRAVENOUS
  Filled 2017-04-28: qty 47

## 2017-04-28 MED ORDER — SODIUM CHLORIDE 0.9 % IV SOLN
Freq: Once | INTRAVENOUS | Status: AC
  Administered 2017-04-28: 14:00:00 via INTRAVENOUS

## 2017-04-28 MED ORDER — HEPARIN SOD (PORK) LOCK FLUSH 100 UNIT/ML IV SOLN
500.0000 [IU] | Freq: Once | INTRAVENOUS | Status: AC | PRN
  Administered 2017-04-28: 500 [IU]
  Filled 2017-04-28: qty 5

## 2017-04-28 NOTE — Patient Instructions (Signed)
[facility] Discharge Instructions for Patients Receiving Chemotherapy  Today you received the following chemotherapy agents:  Adriamycin and Cytoxan.  To help prevent nausea and vomiting after your treatment, we encourage you to take your nausea medication as directed.   If you develop nausea and vomiting that is not controlled by your nausea medication, call the clinic.   BELOW ARE SYMPTOMS THAT SHOULD BE REPORTED IMMEDIATELY:  *FEVER GREATER THAN 100.5 F  *CHILLS WITH OR WITHOUT FEVER  NAUSEA AND VOMITING THAT IS NOT CONTROLLED WITH YOUR NAUSEA MEDICATION  *UNUSUAL SHORTNESS OF BREATH  *UNUSUAL BRUISING OR BLEEDING  TENDERNESS IN MOUTH AND THROAT WITH OR WITHOUT PRESENCE OF ULCERS  *URINARY PROBLEMS  *BOWEL PROBLEMS  UNUSUAL RASH Items with * indicate a potential emergency and should be followed up as soon as possible.  Feel free to call the clinic should you have any questions or concerns. The clinic phone number is [phone].  Please show the [facility] at check-in to the Emergency Department and triage nurse.  Doxorubicin injection What is this medicine? DOXORUBICIN (dox oh ROO bi sin) is a chemotherapy drug. It is used to treat many kinds of cancer like leukemia, lymphoma, neuroblastoma, sarcoma, and Wilms' tumor. It is also used to treat bladder cancer, breast cancer, lung cancer, ovarian cancer, stomach cancer, and thyroid cancer. This medicine may be used for other purposes; ask your health care provider or pharmacist if you have questions. COMMON BRAND NAME(S): Adriamycin, Adriamycin PFS, Adriamycin RDF, Rubex What should I tell my health care provider before I take this medicine? They need to know if you have any of these conditions: -heart disease -history of low blood counts caused by a medicine -liver disease -recent or ongoing radiation therapy -an unusual or allergic reaction to doxorubicin, other chemotherapy agents, other  medicines, foods, dyes, or preservatives -pregnant or trying to get pregnant -breast-feeding How should I use this medicine? This drug is given as an infusion into a vein. It is administered in a hospital or clinic by a specially trained health care professional. If you have pain, swelling, burning or any unusual feeling around the site of your injection, tell your health care professional right away. Talk to your pediatrician regarding the use of this medicine in children. Special care may be needed. Overdosage: If you think you have taken too much of this medicine contact a poison control center or emergency room at once. NOTE: This medicine is only for you. Do not share this medicine with others. What if I miss a dose? It is important not to miss your dose. Call your doctor or health care professional if you are unable to keep an appointment. What may interact with this medicine? This medicine may interact with the following medications: -6-mercaptopurine -paclitaxel -phenytoin -St. John's Wort -trastuzumab -verapamil This list may not describe all possible interactions. Give your health care provider a list of all the medicines, herbs, non-prescription drugs, or dietary supplements you use. Also tell them if you smoke, drink alcohol, or use illegal drugs. Some items may interact with your medicine. What should I watch for while using this medicine? This drug may make you feel generally unwell. This is not uncommon, as chemotherapy can affect healthy cells as well as cancer cells. Report any side effects. Continue your course of treatment even though you feel ill unless your doctor tells you to stop. There is a maximum amount of this medicine you should receive throughout your life. The amount depends  medical condition being treated and your overall health. Your doctor will watch how much of this medicine you receive in your lifetime. Tell your doctor if you have taken this medicine before. You  may need blood work done while you are taking this medicine. Your urine may turn red for a few days after your dose. This is not blood. If your urine is dark or brown, call your doctor. In some cases, you may be given additional medicines to help with side effects. Follow all directions for their use. Call your doctor or health care professional for advice if you get a fever, chills or sore throat, or other symptoms of a cold or flu. Do not treat yourself. This drug decreases your body's ability to fight infections. Try to avoid being around people who are sick. This medicine may increase your risk to bruise or bleed. Call your doctor or health care professional if you notice any unusual bleeding. Talk to your doctor about your risk of cancer. You may be more at risk for certain types of cancers if you take this medicine. Do not become pregnant while taking this medicine or for 6 months after stopping it. Women should inform their doctor if they wish to become pregnant or think they might be pregnant. Men should not father a child while taking this medicine and for 6 months after stopping it. There is a potential for serious side effects to an unborn child. Talk to your health care professional or pharmacist for more information. Do not breast-feed an infant while taking this medicine. This medicine has caused ovarian failure in some women and reduced sperm counts in some men This medicine may interfere with the ability to have a child. Talk with your doctor or health care professional if you are concerned about your fertility. What side effects may I notice from receiving this medicine? Side effects that you should report to your doctor or health care professional as soon as possible: -allergic reactions like skin rash, itching or hives, swelling of the face, lips, or tongue -breathing problems -chest pain -fast or irregular heartbeat -low blood counts - this medicine may decrease the number of white  blood cells, red blood cells and platelets. You may be at increased risk for infections and bleeding. -pain, redness, or irritation at site where injected -signs of infection - fever or chills, cough, sore throat, pain or difficulty passing urine -signs of decreased platelets or bleeding - bruising, pinpoint red spots on the skin, black, tarry stools, blood in the urine -swelling of the ankles, feet, hands -tiredness -weakness Side effects that usually do not require medical attention (report to your doctor or health care professional if they continue or are bothersome): -diarrhea -hair loss -mouth sores -nail discoloration or damage -nausea -red colored urine -vomiting This list may not describe all possible side effects. Call your doctor for medical advice about side effects. You may report side effects to FDA at 1-800-FDA-1088. Where should I keep my medicine? This drug is given in a hospital or clinic and will not be stored at home. NOTE: This sheet is a summary. It may not cover all possible information. If you have questions about this medicine, talk to your doctor, pharmacist, or health care provider.  2018 Elsevier/Gold Standard (2015-05-28 11:28:51)  Cyclophosphamide injection What is this medicine? CYCLOPHOSPHAMIDE (sye kloe FOSS fa mide) is a chemotherapy drug. It slows the growth of cancer cells. This medicine is used to treat many types of cancer like lymphoma, myeloma,   leukemia, breast cancer, and ovarian cancer, to name a few. This medicine may be used for other purposes; ask your health care provider or pharmacist if you have questions. COMMON BRAND NAME(S): Cytoxan, Neosar What should I tell my health care provider before I take this medicine? They need to know if you have any of these conditions: -blood disorders -history of other chemotherapy -infection -kidney disease -liver disease -recent or ongoing radiation therapy -tumors in the bone marrow -an unusual or  allergic reaction to cyclophosphamide, other chemotherapy, other medicines, foods, dyes, or preservatives -pregnant or trying to get pregnant -breast-feeding How should I use this medicine? This drug is usually given as an injection into a vein or muscle or by infusion into a vein. It is administered in a hospital or clinic by a specially trained health care professional. Talk to your pediatrician regarding the use of this medicine in children. Special care may be needed. Overdosage: If you think you have taken too much of this medicine contact a poison control center or emergency room at once. NOTE: This medicine is only for you. Do not share this medicine with others. What if I miss a dose? It is important not to miss your dose. Call your doctor or health care professional if you are unable to keep an appointment. What may interact with this medicine? This medicine may interact with the following medications: -amiodarone -amphotericin B -azathioprine -certain antiviral medicines for HIV or AIDS such as protease inhibitors (e.g., indinavir, ritonavir) and zidovudine -certain blood pressure medications such as benazepril, captopril, enalapril, fosinopril, lisinopril, moexipril, monopril, perindopril, quinapril, ramipril, trandolapril -certain cancer medications such as anthracyclines (e.g., daunorubicin, doxorubicin), busulfan, cytarabine, paclitaxel, pentostatin, tamoxifen, trastuzumab -certain diuretics such as chlorothiazide, chlorthalidone, hydrochlorothiazide, indapamide, metolazone -certain medicines that treat or prevent blood clots like warfarin -certain muscle relaxants such as succinylcholine -cyclosporine -etanercept -indomethacin -medicines to increase blood counts like filgrastim, pegfilgrastim, sargramostim -medicines used as general anesthesia -metronidazole -natalizumab This list may not describe all possible interactions. Give your health care provider a list of all the  medicines, herbs, non-prescription drugs, or dietary supplements you use. Also tell them if you smoke, drink alcohol, or use illegal drugs. Some items may interact with your medicine. What should I watch for while using this medicine? Visit your doctor for checks on your progress. This drug may make you feel generally unwell. This is not uncommon, as chemotherapy can affect healthy cells as well as cancer cells. Report any side effects. Continue your course of treatment even though you feel ill unless your doctor tells you to stop. Drink water or other fluids as directed. Urinate often, even at night. In some cases, you may be given additional medicines to help with side effects. Follow all directions for their use. Call your doctor or health care professional for advice if you get a fever, chills or sore throat, or other symptoms of a cold or flu. Do not treat yourself. This drug decreases your body's ability to fight infections. Try to avoid being around people who are sick. This medicine may increase your risk to bruise or bleed. Call your doctor or health care professional if you notice any unusual bleeding. Be careful brushing and flossing your teeth or using a toothpick because you may get an infection or bleed more easily. If you have any dental work done, tell your dentist you are receiving this medicine. You may get drowsy or dizzy. Do not drive, use machinery, or do anything that needs mental alertness until   you know how this medicine affects you. Do not become pregnant while taking this medicine or for 1 year after stopping it. Women should inform their doctor if they wish to become pregnant or think they might be pregnant. Men should not father a child while taking this medicine and for 4 months after stopping it. There is a potential for serious side effects to an unborn child. Talk to your health care professional or pharmacist for more information. Do not breast-feed an infant while taking  this medicine. This medicine may interfere with the ability to have a child. This medicine has caused ovarian failure in some women. This medicine has caused reduced sperm counts in some men. You should talk with your doctor or health care professional if you are concerned about your fertility. If you are going to have surgery, tell your doctor or health care professional that you have taken this medicine. What side effects may I notice from receiving this medicine? Side effects that you should report to your doctor or health care professional as soon as possible: -allergic reactions like skin rash, itching or hives, swelling of the face, lips, or tongue -low blood counts - this medicine may decrease the number of white blood cells, red blood cells and platelets. You may be at increased risk for infections and bleeding. -signs of infection - fever or chills, cough, sore throat, pain or difficulty passing urine -signs of decreased platelets or bleeding - bruising, pinpoint red spots on the skin, black, tarry stools, blood in the urine -signs of decreased red blood cells - unusually weak or tired, fainting spells, lightheadedness -breathing problems -dark urine -dizziness -palpitations -swelling of the ankles, feet, hands -trouble passing urine or change in the amount of urine -weight gain -yellowing of the eyes or skin Side effects that usually do not require medical attention (report to your doctor or health care professional if they continue or are bothersome): -changes in nail or skin color -hair loss -missed menstrual periods -mouth sores -nausea, vomiting This list may not describe all possible side effects. Call your doctor for medical advice about side effects. You may report side effects to FDA at 1-800-FDA-1088. Where should I keep my medicine? This drug is given in a hospital or clinic and will not be stored at home. NOTE: This sheet is a summary. It may not cover all possible  information. If you have questions about this medicine, talk to your doctor, pharmacist, or health care provider.  2018 Elsevier/Gold Standard (2012-02-13 16:22:58)  

## 2017-04-29 ENCOUNTER — Inpatient Hospital Stay: Payer: BLUE CROSS/BLUE SHIELD

## 2017-04-29 ENCOUNTER — Other Ambulatory Visit: Payer: Self-pay | Admitting: *Deleted

## 2017-04-29 ENCOUNTER — Encounter: Payer: Self-pay | Admitting: Pharmacist

## 2017-04-29 DIAGNOSIS — Z171 Estrogen receptor negative status [ER-]: Secondary | ICD-10-CM | POA: Diagnosis not present

## 2017-04-29 DIAGNOSIS — Z5189 Encounter for other specified aftercare: Secondary | ICD-10-CM | POA: Diagnosis not present

## 2017-04-29 DIAGNOSIS — C50311 Malignant neoplasm of lower-inner quadrant of right female breast: Secondary | ICD-10-CM

## 2017-04-29 DIAGNOSIS — K219 Gastro-esophageal reflux disease without esophagitis: Secondary | ICD-10-CM | POA: Diagnosis not present

## 2017-04-29 DIAGNOSIS — Z5111 Encounter for antineoplastic chemotherapy: Secondary | ICD-10-CM | POA: Diagnosis not present

## 2017-04-29 MED ORDER — PEGFILGRASTIM INJECTION 6 MG/0.6ML ~~LOC~~
6.0000 mg | PREFILLED_SYRINGE | Freq: Once | SUBCUTANEOUS | Status: AC
  Administered 2017-04-29: 6 mg via SUBCUTANEOUS

## 2017-04-29 NOTE — Patient Instructions (Signed)
Pegfilgrastim injection What is this medicine? PEGFILGRASTIM (PEG fil gra stim) is a long-acting granulocyte colony-stimulating factor that stimulates the growth of neutrophils, a type of white blood cell important in the body's fight against infection. It is used to reduce the incidence of fever and infection in patients with certain types of cancer who are receiving chemotherapy that affects the bone marrow, and to increase survival after being exposed to high doses of radiation. This medicine may be used for other purposes; ask your health care provider or pharmacist if you have questions. COMMON BRAND NAME(S): Neulasta What should I tell my health care provider before I take this medicine? They need to know if you have any of these conditions: -kidney disease -latex allergy -ongoing radiation therapy -sickle cell disease -skin reactions to acrylic adhesives (On-Body Injector only) -an unusual or allergic reaction to pegfilgrastim, filgrastim, other medicines, foods, dyes, or preservatives -pregnant or trying to get pregnant -breast-feeding How should I use this medicine? This medicine is for injection under the skin. If you get this medicine at home, you will be taught how to prepare and give the pre-filled syringe or how to use the On-body Injector. Refer to the patient Instructions for Use for detailed instructions. Use exactly as directed. Tell your healthcare provider immediately if you suspect that the On-body Injector may not have performed as intended or if you suspect the use of the On-body Injector resulted in a missed or partial dose. It is important that you put your used needles and syringes in a special sharps container. Do not put them in a trash can. If you do not have a sharps container, call your pharmacist or healthcare provider to get one. Talk to your pediatrician regarding the use of this medicine in children. While this drug may be prescribed for selected conditions,  precautions do apply. Overdosage: If you think you have taken too much of this medicine contact a poison control center or emergency room at once. NOTE: This medicine is only for you. Do not share this medicine with others. What if I miss a dose? It is important not to miss your dose. Call your doctor or health care professional if you miss your dose. If you miss a dose due to an On-body Injector failure or leakage, a new dose should be administered as soon as possible using a single prefilled syringe for manual use. What may interact with this medicine? Interactions have not been studied. Give your health care provider a list of all the medicines, herbs, non-prescription drugs, or dietary supplements you use. Also tell them if you smoke, drink alcohol, or use illegal drugs. Some items may interact with your medicine. This list may not describe all possible interactions. Give your health care provider a list of all the medicines, herbs, non-prescription drugs, or dietary supplements you use. Also tell them if you smoke, drink alcohol, or use illegal drugs. Some items may interact with your medicine. What should I watch for while using this medicine? You may need blood work done while you are taking this medicine. If you are going to need a MRI, CT scan, or other procedure, tell your doctor that you are using this medicine (On-Body Injector only). What side effects may I notice from receiving this medicine? Side effects that you should report to your doctor or health care professional as soon as possible: -allergic reactions like skin rash, itching or hives, swelling of the face, lips, or tongue -dizziness -fever -pain, redness, or irritation at site   where injected -pinpoint red spots on the skin -red or dark-brown urine -shortness of breath or breathing problems -stomach or side pain, or pain at the shoulder -swelling -tiredness -trouble passing urine or change in the amount of urine Side  effects that usually do not require medical attention (report to your doctor or health care professional if they continue or are bothersome): -bone pain -muscle pain This list may not describe all possible side effects. Call your doctor for medical advice about side effects. You may report side effects to FDA at 1-800-FDA-1088. Where should I keep my medicine? Keep out of the reach of children. Store pre-filled syringes in a refrigerator between 2 and 8 degrees C (36 and 46 degrees F). Do not freeze. Keep in carton to protect from light. Throw away this medicine if it is left out of the refrigerator for more than 48 hours. Throw away any unused medicine after the expiration date. NOTE: This sheet is a summary. It may not cover all possible information. If you have questions about this medicine, talk to your doctor, pharmacist, or health care provider.  2018 Elsevier/Gold Standard (2016-03-27 12:58:03)  

## 2017-05-04 ENCOUNTER — Telehealth: Payer: Self-pay | Admitting: *Deleted

## 2017-05-04 ENCOUNTER — Other Ambulatory Visit: Payer: Self-pay | Admitting: *Deleted

## 2017-05-04 DIAGNOSIS — Z170 Estrogen receptor positive status [ER+]: Principal | ICD-10-CM

## 2017-05-04 DIAGNOSIS — C50311 Malignant neoplasm of lower-inner quadrant of right female breast: Secondary | ICD-10-CM

## 2017-05-04 NOTE — Progress Notes (Signed)
[facility]  Telephone:(336) [phone] [phone]) [phone]     ID: Kelsey Henry DOB: [birth year]  MR#: [id number]  JYN#:[id number]  Patient Care Team: Patient, No Pcp Per as PCP - General (General Practice) Magrinat, Virgie Dad, MD as Consulting Physician (Oncology) Kyung Rudd, MD as Consulting Physician (Radiation Oncology) Rolm Bookbinder, MD as Consulting Physician (General Surgery) Meisinger, Sherren Mocha, MD as Consulting Physician (Obstetrics and Gynecology) Loletta Specter, MD (Unknown Physician Specialty) OTHER MD:  CHIEF COMPLAINT: triple negative breast cancer  CURRENT TREATMENT: adjuvant chemotherapy   HISTORY OF CURRENT ILLNESS: From the original intake note:  Kelsey Henry had routine screening mammography October 2018 showing a possible right breast mass.  She was scheduled for unilateral right mammography and tomography with right breast ultrasonography at the breast center February 06, 2017.  This showed the breast density to be category D.  In the lower inner quadrant of the right breast there was a 0.6 cm oval mass, which by ultrasound measured 0.9 cm and was irregular and hypoechoic.  Ultrasound of the right axilla was sonographically benign.  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  The patient's subsequent history is as detailed below.  INTERVAL HISTORY: Kelsey Henry returns today for a follow-up and treatment of her triple negative breast cancer.  She is currently being treated adjuvantly with cyclophosphamide and doxorubicin, given every 14 days, with 4 cycles planned, to be followed by paclitaxel.  Today is day 8 cycle 1.    REVIEW OF SYSTEMS: Kelsey Henry reports feeling very fatigued overall. She states that this past Friday,  05/01/2017, she drank ginger ale and soon after noticed indigestion discomfort to her left upper quadrant. It is described as a sharp pain. Kelsey Henry began feeling nauseous Friday prior to drinking the ginger ale and still endorses feeling that way intermittently since. She is not sure if it is related to constipation, stating that she was constipated over the weekend. Yesterday, 05/04/2017 she did have two bowel and adds that they were not hard. She also endorses decreased appetite. She denies unusual headaches, visual changes, vomiting, or dizziness. There has been no unusual cough, phlegm production, or pleurisy. This been no change in bladder habits. She denies unexplained weight loss, bleeding, rash, or fever. A detailed review of systems was otherwise noncontributory.    PAST MEDICAL HISTORY: Past Medical History:  Diagnosis Date  . Cancer [facility])    breast  . Genetic testing 02/19/2017   STAT Breast panel with reflext to Multi-Cancer panel (83 genes) @ [facility] - No pathogenic mutations detected  . GERD (gastroesophageal reflux disease)    no meds  . Seasonal allergies   . SVD (spontaneous vaginal delivery)    x 1    PAST SURGICAL HISTORY: Past Surgical History:  Procedure Laterality Date  . BILATERAL SALPINGECTOMY Bilateral 06/23/2012   Procedure: BILATERAL SALPINGECTOMY;  Surgeon: Cheri Fowler, MD;  Location: [facility];  Service: Gynecology;  Laterality: Bilateral;  . BREAST LUMPECTOMY WITH RADIOACTIVE SEED AND SENTINEL LYMPH NODE BIOPSY Right 03/23/2017   Procedure: BREAST LUMPECTOMY WITH RADIOACTIVE SEED AND SENTINEL LYMPH NODE BIOPSY;  Surgeon: Rolm Bookbinder, MD;  Location: [facility];  Service: General;  Laterality: Right;  . LAPAROSCOPIC SUPRACERVICAL HYSTERECTOMY N/A 06/23/2012   Procedure: LAPAROSCOPIC SUPRACERVICAL HYSTERECTOMY;  Surgeon: Cheri Fowler, MD;  Location: [facility];  Service: Gynecology;  Laterality: N/A;  . PORTACATH PLACEMENT Right 03/23/2017   Procedure: INSERTION  PORT-A-CATH;  Surgeon: Rolm Bookbinder, MD;  Location: [facility];  Service: General;  Laterality: Right;  . TUBAL LIGATION    . VULVAR LESION REMOVAL N/A 11/01/2014   Procedure: MONS PUBIS LESION;  Surgeon: Cheri Fowler, MD;  Location: [facility];  Service: Gynecology;  Laterality: N/A;  local anesthesia with IV sedation if needed  . WISDOM TOOTH EXTRACTION      FAMILY HISTORY No family history on file.The patient has no information regarding her father or his side of the family.  The patient's mother is 69 years old as of November 2018.  The patient has 3 brothers, 2 sisters.  One sister had 2 "benign tumors" removed from the right breast at the age of 84.  A maternal grandfather had prostate cancer in his 36s.  GYNECOLOGIC HISTORY:  Patient's last menstrual period was 05/26/2012. Menarche age 44, first live birth age 24, the patient is GX P1.  She stopped having periods 2012 when she underwent hysterectomy, without salpingo-oophorectomy..  She used hormone replacement for approximately 3 months.  She is used oral contraceptives remotely with no complications.  SOCIAL HISTORY:  Kelsey Henry works as an Psychologist, educational for [employer], in the paternal DNA subsection.  Her husband Lennette Bihari is an Clinical biochemist.  There is some [name] is an Producer, television/film/video and lives in [place].  The patient has no grandchildren.  She is a Psychologist, forensic.    ADVANCED DIRECTIVES: Not in place   HEALTH MAINTENANCE: Social History   Tobacco Use  . Smoking status: Never Smoker  . Smokeless tobacco: Never Used  Substance Use Topics  . Alcohol use: No  . Drug use: No     Colonoscopy: n/a  PAP: Status post hysterectomy  Bone density: n/a   Allergies  Allergen Reactions  . Aspirin Hives    Current Outpatient Medications  Medication Sig Dispense Refill  . dexamethasone (DECADRON) 4 MG tablet Take 2 tablets by mouth once a day on the day after chemotherapy and then take 2 tablets two times a day for 2 days. Take with food. 30  tablet 1  . lidocaine-prilocaine (EMLA) cream Apply to affected area once 30 g 3  . LORazepam (ATIVAN) 0.5 MG tablet Take 1 tablet (0.5 mg total) by mouth at bedtime as needed (Nausea or vomiting). 30 tablet 0  . Multiple Vitamins-Minerals (MULTIVITAMIN ADULT) CHEW Chew 2 % by mouth.    . oxyCODONE (OXY IR/ROXICODONE) 5 MG immediate release tablet Take 1 tablet (5 mg total) by mouth every 6 (six) hours as needed for severe pain. (Patient not taking: Reported on 04/23/2017) 10 tablet 0  . prochlorperazine (COMPAZINE) 10 MG tablet Take 1 tablet (10 mg total) by mouth every 6 (six) hours as needed (Nausea or vomiting). (Patient not taking: Reported on 04/23/2017) 30 tablet 1   No current facility-administered medications for this visit.     OBJECTIVE: Middle-aged African-American woman who appears stated age  62:   05/05/17 0905  BP: 119/80  Pulse: 77  Resp: 18  Temp: 98.4 F (36.9 C)  SpO2: 100%     Body mass index is 23.66 kg/m.   Wt Readings from Last 3 Encounters:  05/05/17 125 lb 3.2 oz (56.8 kg)  04/03/17 128 lb 3.2 oz (58.2 kg)  03/17/17 127 lb (57.6 kg)      ECOG FS:1 - Symptomatic but completely ambulatory  Sclerae unicteric, EOMs intact Oropharynx clear and  moist No cervical or supraclavicular adenopathy Lungs no rales or rhonchi Heart regular rate and rhythm Abd soft, nontender, positive bowel sounds MSK no focal spinal tenderness, no upper extremity lymphedema Neuro: nonfocal, well oriented, appropriate affect Breasts: The right breast is undergone lumpectomy.  There is no palpable mass, the cosmetic result is excellent.  There is no evidence of local recurrence.  The left breast is unremarkable.  Both axillae are benign.  LAB RESULTS:  CMP     Component Value Date/Time   NA 140 04/28/2017 1233   NA 142 02/18/2017 1221   K 4.5 04/28/2017 1233   K 4.6 02/18/2017 1221   CL 103 04/28/2017 1233   CO2 28 04/28/2017 1233   CO2 27 02/18/2017 1221   GLUCOSE 87  04/28/2017 1233   GLUCOSE 95 02/18/2017 1221   BUN 15 04/28/2017 1233   BUN 18.4 02/18/2017 1221   CREATININE 1.0 02/18/2017 1221   CALCIUM 10.5 (H) 04/28/2017 1233   CALCIUM 10.3 02/18/2017 1221   PROT 8.4 (H) 04/28/2017 1233   PROT 8.3 02/18/2017 1221   ALBUMIN 4.7 04/28/2017 1233   ALBUMIN 4.4 02/18/2017 1221   AST 26 04/28/2017 1233   AST 22 02/18/2017 1221   ALT 26 04/28/2017 1233   ALT 18 02/18/2017 1221   ALKPHOS 97 04/28/2017 1233   ALKPHOS 89 02/18/2017 1221   BILITOT 0.5 04/28/2017 1233   BILITOT 0.48 02/18/2017 1221   GFRNONAA >60 04/28/2017 1233   GFRAA >60 04/28/2017 1233    No results found for: TOTALPROTELP, ALBUMINELP, A1GS, A2GS, BETS, BETA2SER, GAMS, MSPIKE, SPEI  No results found for: Nils Pyle, [facility]  Lab Results  Component Value Date   WBC 7.0 04/28/2017   NEUTROABS 4.9 04/28/2017   HGB 13.7 03/23/2017   HCT 42.3 04/28/2017   MCV 90.0 04/28/2017   PLT 238 04/28/2017      Chemistry      Component Value Date/Time   NA 140 04/28/2017 1233   NA 142 02/18/2017 1221   K 4.5 04/28/2017 1233   K 4.6 02/18/2017 1221   CL 103 04/28/2017 1233   CO2 28 04/28/2017 1233   CO2 27 02/18/2017 1221   BUN 15 04/28/2017 1233   BUN 18.4 02/18/2017 1221   CREATININE 1.0 02/18/2017 1221      Component Value Date/Time   CALCIUM 10.5 (H) 04/28/2017 1233   CALCIUM 10.3 02/18/2017 1221   ALKPHOS 97 04/28/2017 1233   ALKPHOS 89 02/18/2017 1221   AST 26 04/28/2017 1233   AST 22 02/18/2017 1221   ALT 26 04/28/2017 1233   ALT 18 02/18/2017 1221   BILITOT 0.5 04/28/2017 1233   BILITOT 0.48 02/18/2017 1221       No results found for: LABCA2  No components found for: QMGQQP619  No results for input(s): INR in the last 168 hours.  No results found for: LABCA2  No results found for: JKD326  No results found for: ZTI458  No results found for: KDX833  No results found for: CA2729  No components found for: HGQUANT  No results  found for: CEA1 / No results found for: CEA1   No results found for: AFPTUMOR  No results found for: CHROMOGRNA  No results found for: PSA1  No visits with results within 3 Day(s) from this visit.  Latest known visit with results is:  Appointment on 04/28/2017  Component Date Value Ref Range Status  . Sodium 04/28/2017 140  136 - 145 mmol/L Final  . Potassium  04/28/2017 4.5  3.3 - 4.7 mmol/L Final  . Chloride 04/28/2017 103  98 - 109 mmol/L Final  . CO2 04/28/2017 28  22 - 29 mmol/L Final  . Glucose, Bld 04/28/2017 87  70 - 140 mg/dL Final  . BUN 04/28/2017 15  7 - 26 mg/dL Final  . Creatinine 04/28/2017 0.98  0.60 - 1.10 mg/dL Final  . Calcium 04/28/2017 10.5* 8.4 - 10.4 mg/dL Final  . Total Protein 04/28/2017 8.4* 6.4 - 8.3 g/dL Final  . Albumin 04/28/2017 4.7  3.5 - 5.0 g/dL Final  . AST 04/28/2017 26  5 - 34 U/L Final  . ALT 04/28/2017 26  0 - 55 U/L Final  . Alkaline Phosphatase 04/28/2017 97  40 - 150 U/L Final  . Total Bilirubin 04/28/2017 0.5  0.2 - 1.2 mg/dL Final  . GFR, Est Non Af Am 04/28/2017 >60  >60 mL/min Final  . GFR, Est AFR Am 04/28/2017 >60  >60 mL/min Final   Comment: (NOTE) The eGFR has been calculated using the CKD EPI equation. This calculation has not been validated in all clinical situations. eGFR's persistently <60 mL/min signify possible Chronic Kidney Disease.   Georgiann Hahn gap 04/28/2017 9  3 - 11 Final   Performed at [facility], [place] [address] [place], [place] [address]  . WBC Count 04/28/2017 7.0  3.9 - 10.3 K/uL Final  . RBC 04/28/2017 4.70  3.70 - 5.45 MIL/uL Final  . Hemoglobin 04/28/2017 14.3  11.6 - 15.9 g/dL Final  . HCT 04/28/2017 42.3  34.8 - 46.6 % Final  . MCV 04/28/2017 90.0  79.5 - 101.0 fL Final  . MCH 04/28/2017 30.4  25.1 - 34.0 pg Final  . MCHC 04/28/2017 33.8  31.5 - 36.0 g/dL Final  . RDW 04/28/2017 12.1  11.2 - 16.1 % Final  . Platelet Count 04/28/2017 238  145 - 400 K/uL Final  . Neutrophils  Relative % 04/28/2017 70  % Corrected  . Neutro Abs 04/28/2017 4.9  1.5 - 6.5 K/uL Corrected  . Lymphocytes Relative 04/28/2017 24  % Corrected  . Lymphs Abs 04/28/2017 1.7  0.9 - 3.3 K/uL Corrected  . Monocytes Relative 04/28/2017 5  % Corrected  . Monocytes Absolute 04/28/2017 0.4  0.1 - 0.9 K/uL Corrected  . Eosinophils Relative 04/28/2017 1  % Corrected  . Eosinophils Absolute 04/28/2017 0.1  0.0 - 0.5 K/uL Corrected  . Basophils Relative 04/28/2017 0  % Corrected  . Basophils Absolute 04/28/2017 0.0  0.0 - 0.1 K/uL Corrected   Performed at [facility], [place] [address] [place], [place] [address]    (this displays the last labs from the last 3 days)  No results found for: TOTALPROTELP, ALBUMINELP, A1GS, A2GS, BETS, BETA2SER, GAMS, MSPIKE, SPEI (this displays SPEP labs)  No results found for: KPAFRELGTCHN, LAMBDASER, KAPLAMBRATIO (kappa/lambda light chains)  No results found for: HGBA, HGBA2QUANT, HGBFQUANT, HGBSQUAN (Hemoglobinopathy evaluation)   No results found for: LDH  No results found for: IRON, TIBC, IRONPCTSAT (Iron and TIBC)  No results found for: FERRITIN  Urinalysis No results found for: COLORURINE, APPEARANCEUR, LABSPEC, PHURINE, GLUCOSEU, HGBUR, BILIRUBINUR, KETONESUR, PROTEINUR, UROBILINOGEN, NITRITE, LEUKOCYTESUR   STUDIES: No results found.  ELIGIBLE FOR AVAILABLE RESEARCH PROTOCOL: UPBEAT  ASSESSMENT: 52 y.o. [place] woman status post right breast lower inner quadrant biopsy February 09, 2017 for a clinical T1b N0, stage IB invasive ductal carcinoma, grade 3, triple negative  (1) status post right lumpectomy 03/23/2017 for  a pT1b pN0, stage IB invasive ductal carcinoma, grade 3, with negative margins.  (2) adjuvant chemotherapy consisting of cyclophosphamide and doxorubicin in dose dense fashion x4 to start 04/28/2017, followed by weekly paclitaxel x12  (does not qualify for BR003)  (a) echocardiogram 02/25/2017 showed a  baseline ejection fraction in the 60-65%  (3) adjuvant radiation to follow  (4) genetics testing 02/19/2017 through the STAT Breast panel with reflext to Multi-Cancer panel (83 genes) @ [facility] - No pathogenic mutations detected in ALK, APC, ATM, AXIN2, BAP1, BARD1, BLM, BMPR1A, BRCA1, BRCA2, BRIP1, CASR, CDC73, CDH1, CDK4, CDKN1B, CDKN1C, CDKN2A, CEBPA, CHEK2, CTNNA1, DICER1, DIS3L2, EGFR, EPCAM, FH, FLCN, GATA2, GPC3, GREM1, HOXB13, HRAS, KIT, MAX, MEN1, MET, MITF, MLH1, MSH2, MSH3, MSH6, MUTYH, NBN, NF1, NF2, NTHL1, PALB2, PDGFRA, PHOX2B, PMS2, POLD1, POLE, POT1, PRKAR1A, PTCH1, PTEN, RAD50, RAD51C, RAD51D, RB1, RECQL4, RET, RUNX1, SDHA, SDHAF2, SDHB, SDHC, SDHD, SMAD4, SMARCA4, SMARCB1, SMARCE1, STK11, SUFU, TERC, TERT, TMEM127, TP53, TSC1, TSC2, VHL, WRN, WT1.  (a) Variants of Uncertain Significance in PDGFRA c.3040G>A (p.Ala1014Thr) and SMARCA4 c.2044C>G (p.Leu682Val) noted  PLAN: Kelsey Henry did moderately well with her first cycle of chemotherapy, but I think we can do Henry.  I think many of the symptoms she is experiencing are due to to reflux.  We are starting her on omeprazole daily for that.  We are extending the dexamethasone an additional 2 days, on days 5 and 6, only 4 mg in the morning I think that will be helpful.  Also beginning on day 3 she will be able to take ondansetron in addition to Compazine for nausea control.  Finally she will start stool softeners and use MiraLAX as needed beginning on day 2 of chemotherapy.  With these changes I think her second cycle will be much easier on her but I will see her on day 8 of that cycle as well just to make sure everything goes as that is supposed to.  She will call for any other issues that may develop before the next visit.  Magrinat, Virgie Dad, MD  05/05/17 9:17 AM Medical Oncology and Hematology [facility] [address] [place], [place] [address] Tel. [phone]    Fax. [phone]  This document serves as a  record of services personally performed by Chauncey Cruel, MD. It was created on his behalf by Margit Banda, a trained medical scribe. The creation of this record is based on the scribe's personal observations and the provider's statements to them.  I have reviewed the above documentation for accuracy and completeness, and I agree with the above.

## 2017-05-04 NOTE — Telephone Encounter (Signed)
Received communication from [employer] from 05/03/17.  Pt c/o of heart burn/gas pain on L side of chest.  She was instructed to go to ED.  Pt did not go.  Called pt & she states that she spoke with [facility] RN/Navigator & pt thinks that she has constipation.  She states that she took a fleets enema with no results. She was instructed to try magnesium citrate.  She is drinking that now.  She is trying 1/2 bottle mixed with gatorade.  Discussed using stool softeners as needed & may need to start before next chemotherapy & stay on for a while until regulated.  She expressed understanding & will call if further problems.

## 2017-05-05 ENCOUNTER — Telehealth: Payer: Self-pay

## 2017-05-05 ENCOUNTER — Inpatient Hospital Stay (HOSPITAL_BASED_OUTPATIENT_CLINIC_OR_DEPARTMENT_OTHER): Payer: BLUE CROSS/BLUE SHIELD | Admitting: Oncology

## 2017-05-05 ENCOUNTER — Encounter: Payer: Self-pay | Admitting: *Deleted

## 2017-05-05 ENCOUNTER — Telehealth: Payer: Self-pay | Admitting: Oncology

## 2017-05-05 ENCOUNTER — Inpatient Hospital Stay: Payer: BLUE CROSS/BLUE SHIELD

## 2017-05-05 ENCOUNTER — Telehealth: Payer: Self-pay | Admitting: *Deleted

## 2017-05-05 VITALS — BP 119/80 | HR 77 | Temp 98.4°F | Resp 18 | Ht 61.0 in | Wt 125.2 lb

## 2017-05-05 DIAGNOSIS — K219 Gastro-esophageal reflux disease without esophagitis: Secondary | ICD-10-CM

## 2017-05-05 DIAGNOSIS — Z5189 Encounter for other specified aftercare: Secondary | ICD-10-CM

## 2017-05-05 DIAGNOSIS — D701 Agranulocytosis secondary to cancer chemotherapy: Secondary | ICD-10-CM | POA: Insufficient documentation

## 2017-05-05 DIAGNOSIS — Z170 Estrogen receptor positive status [ER+]: Principal | ICD-10-CM

## 2017-05-05 DIAGNOSIS — T451X5A Adverse effect of antineoplastic and immunosuppressive drugs, initial encounter: Secondary | ICD-10-CM | POA: Insufficient documentation

## 2017-05-05 DIAGNOSIS — C50311 Malignant neoplasm of lower-inner quadrant of right female breast: Secondary | ICD-10-CM

## 2017-05-05 DIAGNOSIS — Z171 Estrogen receptor negative status [ER-]: Secondary | ICD-10-CM | POA: Diagnosis not present

## 2017-05-05 DIAGNOSIS — Z5111 Encounter for antineoplastic chemotherapy: Secondary | ICD-10-CM | POA: Diagnosis not present

## 2017-05-05 DIAGNOSIS — Z95828 Presence of other vascular implants and grafts: Secondary | ICD-10-CM

## 2017-05-05 LAB — CBC WITH DIFFERENTIAL (CANCER CENTER ONLY)
Basophils Absolute: 0 10*3/uL (ref 0.0–0.1)
Basophils Relative: 1 %
EOS PCT: 12 %
Eosinophils Absolute: 0.2 10*3/uL (ref 0.0–0.5)
HCT: 37.3 % (ref 34.8–46.6)
HEMOGLOBIN: 12.7 g/dL (ref 11.6–15.9)
LYMPHS PCT: 54 %
Lymphs Abs: 0.7 10*3/uL — ABNORMAL LOW (ref 0.9–3.3)
MCH: 30.1 pg (ref 25.1–34.0)
MCHC: 34.1 g/dL (ref 31.5–36.0)
MCV: 88.2 fL (ref 79.5–101.0)
Monocytes Absolute: 0.1 10*3/uL (ref 0.1–0.9)
Monocytes Relative: 5 %
NEUTROS ABS: 0.4 10*3/uL — AB (ref 1.5–6.5)
NEUTROS PCT: 28 %
PLATELETS: 151 10*3/uL (ref 145–400)
RBC: 4.23 MIL/uL (ref 3.70–5.45)
RDW: 12.3 % (ref 11.2–16.1)
WBC: 1.4 10*3/uL — AB (ref 3.9–10.3)

## 2017-05-05 LAB — CMP (CANCER CENTER ONLY)
ALK PHOS: 93 U/L (ref 40–150)
ALT: 80 U/L — AB (ref 0–55)
AST: 20 U/L (ref 5–34)
Albumin: 3.9 g/dL (ref 3.5–5.0)
Anion gap: 9 (ref 3–11)
BUN: 14 mg/dL (ref 7–26)
CHLORIDE: 98 mmol/L (ref 98–109)
CO2: 26 mmol/L (ref 22–29)
CREATININE: 0.84 mg/dL (ref 0.60–1.10)
Calcium: 9.4 mg/dL (ref 8.4–10.4)
GFR, Est AFR Am: >60 mL/min (ref >60)
Glucose, Bld: 111 mg/dL (ref 70–140)
Potassium: 4 mmol/L (ref 3.3–4.7)
Sodium: 133 mmol/L — ABNORMAL LOW (ref 136–145)
Total Bilirubin: 0.6 mg/dL (ref 0.2–1.2)
Total Protein: 7.1 g/dL (ref 6.4–8.3)

## 2017-05-05 MED ORDER — ONDANSETRON HCL 8 MG PO TABS: 8.0000 mg | ORAL_TABLET | Freq: Three times a day (TID) | ORAL | 0 refills | Status: DC | PRN

## 2017-05-05 MED ORDER — HEPARIN SOD (PORK) LOCK FLUSH 100 UNIT/ML IV SOLN
500.0000 [IU] | Freq: Once | INTRAVENOUS | Status: DC
  Filled 2017-05-05: qty 5

## 2017-05-05 MED ORDER — OMEPRAZOLE 40 MG PO CPDR: 40.0000 mg | DELAYED_RELEASE_CAPSULE | Freq: Every day | ORAL | 0 refills | Status: DC

## 2017-05-05 MED ORDER — SODIUM CHLORIDE 0.9% FLUSH
10.0000 mL | INTRAVENOUS | Status: DC | PRN
  Administered 2017-05-05: 10 mL via INTRAVENOUS
  Filled 2017-05-05: qty 10

## 2017-05-05 NOTE — Telephone Encounter (Signed)
Faxed FMLA paperwork and gave patient a copy. Copy sent to HIM for scanning.

## 2017-05-05 NOTE — Telephone Encounter (Signed)
Gave patient AVs and calendar of upcoming January and February appointments.  °

## 2017-05-05 NOTE — Telephone Encounter (Signed)
Received call from Lab with panic level ANC of 0.4 and notified Dr Vinie Sill at this time. No new orders received.

## 2017-05-08 ENCOUNTER — Other Ambulatory Visit: Payer: Self-pay | Admitting: Oncology

## 2017-05-11 ENCOUNTER — Other Ambulatory Visit: Payer: Self-pay | Admitting: *Deleted

## 2017-05-11 DIAGNOSIS — C50311 Malignant neoplasm of lower-inner quadrant of right female breast: Secondary | ICD-10-CM

## 2017-05-11 DIAGNOSIS — Z171 Estrogen receptor negative status [ER-]: Principal | ICD-10-CM

## 2017-05-12 ENCOUNTER — Encounter: Payer: Self-pay | Admitting: Adult Health

## 2017-05-12 ENCOUNTER — Inpatient Hospital Stay (HOSPITAL_BASED_OUTPATIENT_CLINIC_OR_DEPARTMENT_OTHER): Payer: BLUE CROSS/BLUE SHIELD | Admitting: Adult Health

## 2017-05-12 ENCOUNTER — Encounter: Payer: Self-pay | Admitting: *Deleted

## 2017-05-12 ENCOUNTER — Inpatient Hospital Stay: Payer: BLUE CROSS/BLUE SHIELD

## 2017-05-12 ENCOUNTER — Ambulatory Visit: Payer: BLUE CROSS/BLUE SHIELD

## 2017-05-12 VITALS — BP 115/64 | HR 88 | Temp 98.3°F | Resp 18 | Ht 61.0 in | Wt 127.7 lb

## 2017-05-12 DIAGNOSIS — Z5111 Encounter for antineoplastic chemotherapy: Secondary | ICD-10-CM | POA: Diagnosis not present

## 2017-05-12 DIAGNOSIS — Z171 Estrogen receptor negative status [ER-]: Principal | ICD-10-CM

## 2017-05-12 DIAGNOSIS — C50311 Malignant neoplasm of lower-inner quadrant of right female breast: Secondary | ICD-10-CM

## 2017-05-12 DIAGNOSIS — K219 Gastro-esophageal reflux disease without esophagitis: Secondary | ICD-10-CM | POA: Diagnosis not present

## 2017-05-12 DIAGNOSIS — Z5189 Encounter for other specified aftercare: Secondary | ICD-10-CM | POA: Diagnosis not present

## 2017-05-12 LAB — CBC WITH DIFFERENTIAL (CANCER CENTER ONLY)
BASOS ABS: 0.1 10*3/uL (ref 0.0–0.1)
BASOS PCT: 1 %
Eosinophils Absolute: 0.1 10*3/uL (ref 0.0–0.5)
Eosinophils Relative: 1 %
HEMATOCRIT: 36.3 % (ref 34.8–46.6)
HEMOGLOBIN: 12.2 g/dL (ref 11.6–15.9)
LYMPHS PCT: 16 %
Lymphs Abs: 2.1 10*3/uL (ref 0.9–3.3)
MCH: 30.3 pg (ref 25.1–34.0)
MCHC: 33.6 g/dL (ref 31.5–36.0)
MCV: 90.1 fL (ref 79.5–101.0)
MONO ABS: 0.5 10*3/uL (ref 0.1–0.9)
Monocytes Relative: 4 %
NEUTROS ABS: 10.1 10*3/uL — AB (ref 1.5–6.5)
NEUTROS PCT: 78 %
Platelet Count: 235 10*3/uL (ref 145–400)
RBC: 4.03 MIL/uL (ref 3.70–5.45)
RDW: 12.5 % (ref 11.2–16.1)
WBC Count: 12.8 10*3/uL — ABNORMAL HIGH (ref 3.9–10.3)

## 2017-05-12 LAB — CMP (CANCER CENTER ONLY)
ALBUMIN: 4.1 g/dL (ref 3.5–5.0)
ALK PHOS: 132 U/L (ref 40–150)
ALT: 48 U/L (ref 0–55)
AST: 21 U/L (ref 5–34)
Anion gap: 8 (ref 3–11)
BUN: 22 mg/dL (ref 7–26)
CO2: 27 mmol/L (ref 22–29)
CREATININE: 0.88 mg/dL (ref 0.60–1.10)
Calcium: 9.8 mg/dL (ref 8.4–10.4)
Chloride: 105 mmol/L (ref 98–109)
GFR, Est AFR Am: >60 mL/min (ref >60)
GLUCOSE: 132 mg/dL (ref 70–140)
Potassium: 3.7 mmol/L (ref 3.3–4.7)
Sodium: 140 mmol/L (ref 136–145)
TOTAL PROTEIN: 7.6 g/dL (ref 6.4–8.3)

## 2017-05-12 MED ORDER — PALONOSETRON HCL INJECTION 0.25 MG/5ML
0.2500 mg | Freq: Once | INTRAVENOUS | Status: AC
  Administered 2017-05-12: 0.25 mg via INTRAVENOUS

## 2017-05-12 MED ORDER — SODIUM CHLORIDE 0.9 % IV SOLN
Freq: Once | INTRAVENOUS | Status: AC
  Administered 2017-05-12: 10:00:00 via INTRAVENOUS

## 2017-05-12 MED ORDER — DOXORUBICIN HCL CHEMO IV INJECTION 2 MG/ML
60.0000 mg/m2 | Freq: Once | INTRAVENOUS | Status: AC
  Administered 2017-05-12: 94 mg via INTRAVENOUS
  Filled 2017-05-12: qty 47

## 2017-05-12 MED ORDER — SODIUM CHLORIDE 0.9 % IV SOLN
600.0000 mg/m2 | Freq: Once | INTRAVENOUS | Status: AC
  Administered 2017-05-12: 940 mg via INTRAVENOUS
  Filled 2017-05-12: qty 47

## 2017-05-12 MED ORDER — FOSAPREPITANT DIMEGLUMINE INJECTION 150 MG
Freq: Once | INTRAVENOUS | Status: AC
  Administered 2017-05-12: 11:00:00 via INTRAVENOUS
  Filled 2017-05-12: qty 5

## 2017-05-12 MED ORDER — HEPARIN SOD (PORK) LOCK FLUSH 100 UNIT/ML IV SOLN
500.0000 [IU] | Freq: Once | INTRAVENOUS | Status: AC | PRN
  Administered 2017-05-12: 500 [IU]
  Filled 2017-05-12: qty 5

## 2017-05-12 MED ORDER — PALONOSETRON HCL INJECTION 0.25 MG/5ML
INTRAVENOUS | Status: AC
Start: 2017-05-12 — End: 2017-05-12
  Filled 2017-05-12: qty 5

## 2017-05-12 MED ORDER — SODIUM CHLORIDE 0.9% FLUSH
10.0000 mL | INTRAVENOUS | Status: DC | PRN
  Administered 2017-05-12: 10 mL
  Filled 2017-05-12: qty 10

## 2017-05-12 NOTE — Patient Instructions (Signed)
Implanted Port Home Guide An implanted port is a type of central line that is placed under the skin. Central lines are used to provide IV access when treatment or nutrition needs to be given through a person's veins. Implanted ports are used for long-term IV access. An implanted port may be placed because:  You need IV medicine that would be irritating to the small veins in your hands or arms.  You need long-term IV medicines, such as antibiotics.  You need IV nutrition for a long period.  You need frequent blood draws for lab tests.  You need dialysis.  Implanted ports are usually placed in the chest area, but they can also be placed in the upper arm, the abdomen, or the leg. An implanted port has two main parts:  Reservoir. The reservoir is round and will appear as a small, raised area under your skin. The reservoir is the part where a needle is inserted to give medicines or draw blood.  Catheter. The catheter is a thin, flexible tube that extends from the reservoir. The catheter is placed into a large vein. Medicine that is inserted into the reservoir goes into the catheter and then into the vein.  How will I care for my incision site? Do not get the incision site wet. Bathe or shower as directed by your health care provider. How is my port accessed? Special steps must be taken to access the port:  Before the port is accessed, a numbing cream can be placed on the skin. This helps numb the skin over the port site.  Your health care provider uses a sterile technique to access the port. ? Your health care provider must put on a mask and sterile gloves. ? The skin over your port is cleaned carefully with an antiseptic and allowed to dry. ? The port is gently pinched between sterile gloves, and a needle is inserted into the port.  Only "non-coring" port needles should be used to access the port. Once the port is accessed, a blood return should be checked. This helps ensure that the port  is in the vein and is not clogged.  If your port needs to remain accessed for a constant infusion, a clear (transparent) bandage will be placed over the needle site. The bandage and needle will need to be changed every week, or as directed by your health care provider.  Keep the bandage covering the needle clean and dry. Do not get it wet. Follow your health care provider's instructions on how to take a shower or bath while the port is accessed.  If your port does not need to stay accessed, no bandage is needed over the port.  What is flushing? Flushing helps keep the port from getting clogged. Follow your health care provider's instructions on how and when to flush the port. Ports are usually flushed with saline solution or a medicine called heparin. The need for flushing will depend on how the port is used.  If the port is used for intermittent medicines or blood draws, the port will need to be flushed: ? After medicines have been given. ? After blood has been drawn. ? As part of routine maintenance.  If a constant infusion is running, the port may not need to be flushed.  How long will my port stay implanted? The port can stay in for as long as your health care provider thinks it is needed. When it is time for the port to come out, surgery will be   done to remove it. The procedure is similar to the one performed when the port was put in. When should I seek immediate medical care? When you have an implanted port, you should seek immediate medical care if:  You notice a bad smell coming from the incision site.  You have swelling, redness, or drainage at the incision site.  You have more swelling or pain at the port site or the surrounding area.  You have a fever that is not controlled with medicine.  This information is not intended to replace advice given to you by your health care provider. Make sure you discuss any questions you have with your health care provider. Document  Released: 03/31/2005 Document Revised: 09/06/2015 Document Reviewed: 12/06/2012 Elsevier Interactive Patient Education  2017 Elsevier Inc.  

## 2017-05-12 NOTE — Patient Instructions (Signed)
[facility] Discharge Instructions for Patients Receiving Chemotherapy  Today you received the following chemotherapy agents:  Adriamycin and Cytoxan.  To help prevent nausea and vomiting after your treatment, we encourage you to take your nausea medication as directed.   If you develop nausea and vomiting that is not controlled by your nausea medication, call the clinic.   BELOW ARE SYMPTOMS THAT SHOULD BE REPORTED IMMEDIATELY:  *FEVER GREATER THAN 100.5 F  *CHILLS WITH OR WITHOUT FEVER  NAUSEA AND VOMITING THAT IS NOT CONTROLLED WITH YOUR NAUSEA MEDICATION  *UNUSUAL SHORTNESS OF BREATH  *UNUSUAL BRUISING OR BLEEDING  TENDERNESS IN MOUTH AND THROAT WITH OR WITHOUT PRESENCE OF ULCERS  *URINARY PROBLEMS  *BOWEL PROBLEMS  UNUSUAL RASH Items with * indicate a potential emergency and should be followed up as soon as possible.  Feel free to call the clinic should you have any questions or concerns. The clinic phone number is [phone].  Please show the [facility] at check-in to the Emergency Department and triage nurse.  Doxorubicin injection What is this medicine? DOXORUBICIN (dox oh ROO bi sin) is a chemotherapy drug. It is used to treat many kinds of cancer like leukemia, lymphoma, neuroblastoma, sarcoma, and Wilms' tumor. It is also used to treat bladder cancer, breast cancer, lung cancer, ovarian cancer, stomach cancer, and thyroid cancer. This medicine may be used for other purposes; ask your health care provider or pharmacist if you have questions. COMMON BRAND NAME(S): Adriamycin, Adriamycin PFS, Adriamycin RDF, Rubex What should I tell my health care provider before I take this medicine? They need to know if you have any of these conditions: -heart disease -history of low blood counts caused by a medicine -liver disease -recent or ongoing radiation therapy -an unusual or allergic reaction to doxorubicin, other chemotherapy agents, other  medicines, foods, dyes, or preservatives -pregnant or trying to get pregnant -breast-feeding How should I use this medicine? This drug is given as an infusion into a vein. It is administered in a hospital or clinic by a specially trained health care professional. If you have pain, swelling, burning or any unusual feeling around the site of your injection, tell your health care professional right away. Talk to your pediatrician regarding the use of this medicine in children. Special care may be needed. Overdosage: If you think you have taken too much of this medicine contact a poison control center or emergency room at once. NOTE: This medicine is only for you. Do not share this medicine with others. What if I miss a dose? It is important not to miss your dose. Call your doctor or health care professional if you are unable to keep an appointment. What may interact with this medicine? This medicine may interact with the following medications: -6-mercaptopurine -paclitaxel -phenytoin -St. John's Wort -trastuzumab -verapamil This list may not describe all possible interactions. Give your health care provider a list of all the medicines, herbs, non-prescription drugs, or dietary supplements you use. Also tell them if you smoke, drink alcohol, or use illegal drugs. Some items may interact with your medicine. What should I watch for while using this medicine? This drug may make you feel generally unwell. This is not uncommon, as chemotherapy can affect healthy cells as well as cancer cells. Report any side effects. Continue your course of treatment even though you feel ill unless your doctor tells you to stop. There is a maximum amount of this medicine you should receive throughout your life. The amount depends  medical condition being treated and your overall health. Your doctor will watch how much of this medicine you receive in your lifetime. Tell your doctor if you have taken this medicine before. You  may need blood work done while you are taking this medicine. Your urine may turn red for a few days after your dose. This is not blood. If your urine is dark or brown, call your doctor. In some cases, you may be given additional medicines to help with side effects. Follow all directions for their use. Call your doctor or health care professional for advice if you get a fever, chills or sore throat, or other symptoms of a cold or flu. Do not treat yourself. This drug decreases your body's ability to fight infections. Try to avoid being around people who are sick. This medicine may increase your risk to bruise or bleed. Call your doctor or health care professional if you notice any unusual bleeding. Talk to your doctor about your risk of cancer. You may be more at risk for certain types of cancers if you take this medicine. Do not become pregnant while taking this medicine or for 6 months after stopping it. Women should inform their doctor if they wish to become pregnant or think they might be pregnant. Men should not father a child while taking this medicine and for 6 months after stopping it. There is a potential for serious side effects to an unborn child. Talk to your health care professional or pharmacist for more information. Do not breast-feed an infant while taking this medicine. This medicine has caused ovarian failure in some women and reduced sperm counts in some men This medicine may interfere with the ability to have a child. Talk with your doctor or health care professional if you are concerned about your fertility. What side effects may I notice from receiving this medicine? Side effects that you should report to your doctor or health care professional as soon as possible: -allergic reactions like skin rash, itching or hives, swelling of the face, lips, or tongue -breathing problems -chest pain -fast or irregular heartbeat -low blood counts - this medicine may decrease the number of white  blood cells, red blood cells and platelets. You may be at increased risk for infections and bleeding. -pain, redness, or irritation at site where injected -signs of infection - fever or chills, cough, sore throat, pain or difficulty passing urine -signs of decreased platelets or bleeding - bruising, pinpoint red spots on the skin, black, tarry stools, blood in the urine -swelling of the ankles, feet, hands -tiredness -weakness Side effects that usually do not require medical attention (report to your doctor or health care professional if they continue or are bothersome): -diarrhea -hair loss -mouth sores -nail discoloration or damage -nausea -red colored urine -vomiting This list may not describe all possible side effects. Call your doctor for medical advice about side effects. You may report side effects to FDA at 1-800-FDA-1088. Where should I keep my medicine? This drug is given in a hospital or clinic and will not be stored at home. NOTE: This sheet is a summary. It may not cover all possible information. If you have questions about this medicine, talk to your doctor, pharmacist, or health care provider.  2018 Elsevier/Gold Standard (2015-05-28 11:28:51)  Cyclophosphamide injection What is this medicine? CYCLOPHOSPHAMIDE (sye kloe FOSS fa mide) is a chemotherapy drug. It slows the growth of cancer cells. This medicine is used to treat many types of cancer like lymphoma, myeloma,   leukemia, breast cancer, and ovarian cancer, to name a few. This medicine may be used for other purposes; ask your health care provider or pharmacist if you have questions. COMMON BRAND NAME(S): Cytoxan, Neosar What should I tell my health care provider before I take this medicine? They need to know if you have any of these conditions: -blood disorders -history of other chemotherapy -infection -kidney disease -liver disease -recent or ongoing radiation therapy -tumors in the bone marrow -an unusual or  allergic reaction to cyclophosphamide, other chemotherapy, other medicines, foods, dyes, or preservatives -pregnant or trying to get pregnant -breast-feeding How should I use this medicine? This drug is usually given as an injection into a vein or muscle or by infusion into a vein. It is administered in a hospital or clinic by a specially trained health care professional. Talk to your pediatrician regarding the use of this medicine in children. Special care may be needed. Overdosage: If you think you have taken too much of this medicine contact a poison control center or emergency room at once. NOTE: This medicine is only for you. Do not share this medicine with others. What if I miss a dose? It is important not to miss your dose. Call your doctor or health care professional if you are unable to keep an appointment. What may interact with this medicine? This medicine may interact with the following medications: -amiodarone -amphotericin B -azathioprine -certain antiviral medicines for HIV or AIDS such as protease inhibitors (e.g., indinavir, ritonavir) and zidovudine -certain blood pressure medications such as benazepril, captopril, enalapril, fosinopril, lisinopril, moexipril, monopril, perindopril, quinapril, ramipril, trandolapril -certain cancer medications such as anthracyclines (e.g., daunorubicin, doxorubicin), busulfan, cytarabine, paclitaxel, pentostatin, tamoxifen, trastuzumab -certain diuretics such as chlorothiazide, chlorthalidone, hydrochlorothiazide, indapamide, metolazone -certain medicines that treat or prevent blood clots like warfarin -certain muscle relaxants such as succinylcholine -cyclosporine -etanercept -indomethacin -medicines to increase blood counts like filgrastim, pegfilgrastim, sargramostim -medicines used as general anesthesia -metronidazole -natalizumab This list may not describe all possible interactions. Give your health care provider a list of all the  medicines, herbs, non-prescription drugs, or dietary supplements you use. Also tell them if you smoke, drink alcohol, or use illegal drugs. Some items may interact with your medicine. What should I watch for while using this medicine? Visit your doctor for checks on your progress. This drug may make you feel generally unwell. This is not uncommon, as chemotherapy can affect healthy cells as well as cancer cells. Report any side effects. Continue your course of treatment even though you feel ill unless your doctor tells you to stop. Drink water or other fluids as directed. Urinate often, even at night. In some cases, you may be given additional medicines to help with side effects. Follow all directions for their use. Call your doctor or health care professional for advice if you get a fever, chills or sore throat, or other symptoms of a cold or flu. Do not treat yourself. This drug decreases your body's ability to fight infections. Try to avoid being around people who are sick. This medicine may increase your risk to bruise or bleed. Call your doctor or health care professional if you notice any unusual bleeding. Be careful brushing and flossing your teeth or using a toothpick because you may get an infection or bleed more easily. If you have any dental work done, tell your dentist you are receiving this medicine. You may get drowsy or dizzy. Do not drive, use machinery, or do anything that needs mental alertness until   you know how this medicine affects you. Do not become pregnant while taking this medicine or for 1 year after stopping it. Women should inform their doctor if they wish to become pregnant or think they might be pregnant. Men should not father a child while taking this medicine and for 4 months after stopping it. There is a potential for serious side effects to an unborn child. Talk to your health care professional or pharmacist for more information. Do not breast-feed an infant while taking  this medicine. This medicine may interfere with the ability to have a child. This medicine has caused ovarian failure in some women. This medicine has caused reduced sperm counts in some men. You should talk with your doctor or health care professional if you are concerned about your fertility. If you are going to have surgery, tell your doctor or health care professional that you have taken this medicine. What side effects may I notice from receiving this medicine? Side effects that you should report to your doctor or health care professional as soon as possible: -allergic reactions like skin rash, itching or hives, swelling of the face, lips, or tongue -low blood counts - this medicine may decrease the number of white blood cells, red blood cells and platelets. You may be at increased risk for infections and bleeding. -signs of infection - fever or chills, cough, sore throat, pain or difficulty passing urine -signs of decreased platelets or bleeding - bruising, pinpoint red spots on the skin, black, tarry stools, blood in the urine -signs of decreased red blood cells - unusually weak or tired, fainting spells, lightheadedness -breathing problems -dark urine -dizziness -palpitations -swelling of the ankles, feet, hands -trouble passing urine or change in the amount of urine -weight gain -yellowing of the eyes or skin Side effects that usually do not require medical attention (report to your doctor or health care professional if they continue or are bothersome): -changes in nail or skin color -hair loss -missed menstrual periods -mouth sores -nausea, vomiting This list may not describe all possible side effects. Call your doctor for medical advice about side effects. You may report side effects to FDA at 1-800-FDA-1088. Where should I keep my medicine? This drug is given in a hospital or clinic and will not be stored at home. NOTE: This sheet is a summary. It may not cover all possible  information. If you have questions about this medicine, talk to your doctor, pharmacist, or health care provider.  2018 Elsevier/Gold Standard (2012-02-13 16:22:58)  

## 2017-05-12 NOTE — Progress Notes (Signed)
[employer]  [phone] Fax:([phone]) [phone]     ID: Kelsey Henry DOB: 31-Mar-1966  MR#: [id number]  EXH#:[id number]  Patient Care Team: Patient, No Pcp Per as PCP - General (General Practice) Magrinat, Virgie Dad, MD as Consulting Physician (Oncology) Kyung Rudd, MD as Consulting Physician (Radiation Oncology) Rolm Bookbinder, MD as Consulting Physician (General Surgery) Meisinger, Sherren Mocha, MD as Consulting Physician (Obstetrics and Gynecology) Loletta Specter, MD (Unknown Physician Specialty) OTHER MD:  CHIEF COMPLAINT: triple negative breast cancer  CURRENT TREATMENT: adjuvant chemotherapy   HISTORY OF CURRENT ILLNESS: From the original intake note:  Kelsey Henry had routine screening mammography October 2018 showing a possible right breast mass.  She was scheduled for unilateral right mammography and tomography with right breast ultrasonography at the breast center February 06, 2017.  This showed the breast density to be category D.  In the lower inner quadrant of the right breast there was a 0.6 cm oval mass, which by ultrasound measured 0.9 cm and was irregular and hypoechoic.  Ultrasound of the right axilla was sonographically benign.  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  The patient's subsequent history is as detailed below.  INTERVAL HISTORY: Kelsey Henry returns today for a follow-up and treatment of her triple negative breast cancer.  She is currently being treated adjuvantly with cyclophosphamide and doxorubicin, given every 14 days, with 4 cycles planned, to be followed by paclitaxel.  Today is day 1 cycle 2..    REVIEW OF SYSTEMS: Kelsey Henry is doing well today.  She had some reflux that resolved with Omeprazole.  She  had constipation following cycle 1 and used a suppository.  She was then told not to use suppositories while she is neutropenic.  Now she has a full feeling back there, and she's concerned.  She notes that she has had a bowel movement every day and that they are normal.  She wants me to look and see if I see anything.  Otherwise, Kelsey Henry is feeling well and a detailed ROS is normal.     PAST MEDICAL HISTORY: Past Medical History:  Diagnosis Date  . Cancer [facility])    breast  . Genetic testing 02/19/2017   STAT Breast panel with reflext to Multi-Cancer panel (83 genes) @ [facility] - No pathogenic mutations detected  . GERD (gastroesophageal reflux disease)    no meds  . Seasonal allergies   . SVD (spontaneous vaginal delivery)    x 1    PAST SURGICAL HISTORY: Past Surgical History:  Procedure Laterality Date  . BILATERAL SALPINGECTOMY Bilateral 06/23/2012   Procedure: BILATERAL SALPINGECTOMY;  Surgeon: Cheri Fowler, MD;  Location: [facility];  Service: Gynecology;  Laterality: Bilateral;  . BREAST LUMPECTOMY WITH RADIOACTIVE SEED AND SENTINEL LYMPH NODE BIOPSY Right 03/23/2017   Procedure: BREAST LUMPECTOMY WITH RADIOACTIVE SEED AND SENTINEL LYMPH NODE BIOPSY;  Surgeon: Rolm Bookbinder, MD;  Location: [facility];  Service: General;  Laterality: Right;  . LAPAROSCOPIC SUPRACERVICAL HYSTERECTOMY N/A 06/23/2012   Procedure: LAPAROSCOPIC SUPRACERVICAL HYSTERECTOMY;  Surgeon: Cheri Fowler, MD;  Location: [facility];  Service: Gynecology;  Laterality: N/A;  . PORTACATH PLACEMENT Right 03/23/2017   Procedure: INSERTION PORT-A-CATH;  Surgeon: Rolm Bookbinder, MD;  Location: [facility];  Service: General;  Laterality: Right;  . TUBAL LIGATION    . VULVAR  LESION REMOVAL N/A 11/01/2014   Procedure: MONS PUBIS LESION;  Surgeon: Cheri Fowler, MD;  Location: [facility];  Service: Gynecology;  Laterality: N/A;  local anesthesia with IV sedation if needed  . WISDOM TOOTH EXTRACTION      FAMILY HISTORY No family  history on file.The patient has no information regarding her father or his side of the family.  The patient's mother is 21 years old as of November 2018.  The patient has 3 brothers, 2 sisters.  One sister had 2 "benign tumors" removed from the right breast at the age of 40.  A maternal grandfather had prostate cancer in his 6s.  GYNECOLOGIC HISTORY:  Patient's last menstrual period was 05/26/2012. Menarche age 49, first live birth age 79, the patient is GX P1.  She stopped having periods 2012 when she underwent hysterectomy, without salpingo-oophorectomy..  She used hormone replacement for approximately 3 months.  She is used oral contraceptives remotely with no complications.  SOCIAL HISTORY:  Kelsey Henry works as an Psychologist, educational for [employer], in the paternal DNA subsection.  Her husband Lennette Bihari is an Clinical biochemist.  There is some [name] is an Producer, television/film/video and lives in [place].  The patient has no grandchildren.  She is a Psychologist, forensic.    ADVANCED DIRECTIVES: Not in place   HEALTH MAINTENANCE: Social History   Tobacco Use  . Smoking status: Never Smoker  . Smokeless tobacco: Never Used  Substance Use Topics  . Alcohol use: No  . Drug use: No     Colonoscopy: n/a  PAP: Status post hysterectomy  Bone density: n/a   Allergies  Allergen Reactions  . Aspirin Hives    Current Outpatient Medications  Medication Sig Dispense Refill  . dexamethasone (DECADRON) 4 MG tablet Take 2 tablets by mouth once a day on the day after chemotherapy and then take 2 tablets two times a day for 2 days. Take with food. 30 tablet 1  . lidocaine-prilocaine (EMLA) cream Apply to affected area once 30 g 3  . LORazepam (ATIVAN) 0.5 MG tablet Take 1 tablet (0.5 mg total) by mouth at bedtime as needed (Nausea or vomiting). 30 tablet 0  . Multiple Vitamins-Minerals (MULTIVITAMIN ADULT) CHEW Chew 2 % by mouth.    Marland Kitchen omeprazole (PRILOSEC) 40 MG capsule Take 1 capsule (40 mg total) by mouth daily. 20 capsule 0  .  ondansetron (ZOFRAN) 8 MG tablet Take 1 tablet (8 mg total) by mouth every 8 (eight) hours as needed for nausea or vomiting. 20 tablet 0  . oxyCODONE (OXY IR/ROXICODONE) 5 MG immediate release tablet Take 1 tablet (5 mg total) by mouth every 6 (six) hours as needed for severe pain. (Patient not taking: Reported on 04/23/2017) 10 tablet 0  . prochlorperazine (COMPAZINE) 10 MG tablet Take 1 tablet (10 mg total) by mouth every 6 (six) hours as needed (Nausea or vomiting). (Patient not taking: Reported on 04/23/2017) 30 tablet 1   No current facility-administered medications for this visit.     OBJECTIVE:   There were no vitals filed for this visit.   There is no height or weight on file to calculate BMI.   Wt Readings from Last 3 Encounters:  05/05/17 125 lb 3.2 oz (56.8 kg)  04/03/17 128 lb 3.2 oz (58.2 kg)  03/17/17 127 lb (57.6 kg)      ECOG FS:1 - Symptomatic but completely ambulatory GENERAL: Patient is a well appearing female in no acute distress HEENT:  Sclerae anicteric.  Oropharynx clear and moist. No  ulcerations or evidence of oropharyngeal candidiasis. Neck is supple.  NODES:  No cervical, supraclavicular, or axillary lymphadenopathy palpated.  BREAST EXAM:  Deferred. LUNGS:  Clear to auscultation bilaterally.  No wheezes or rhonchi. HEART:  Regular rate and rhythm. No murmur appreciated. ABDOMEN:  Soft, nontender.  Positive, normoactive bowel sounds. No organomegaly palpated. MSK:  No focal spinal tenderness to palpation. Full range of motion bilaterally in the upper extremities. EXTREMITIES:  No peripheral edema.   SKIN:  Clear with no obvious rashes or skin changes. No nail dyscrasia. Visually inspected patient anus, there is cream applied, but anus has about 3-4 external hemorrhoids, with one area that is raw, but not irritate, no swelling noted to the area on external inspection.   NEURO:  Nonfocal. Well oriented.  Appropriate affect.    LAB RESULTS:  CMP     Component  Value Date/Time   NA 133 (L) 05/05/2017 0830   NA 142 02/18/2017 1221   K 4.0 05/05/2017 0830   K 4.6 02/18/2017 1221   CL 98 05/05/2017 0830   CO2 26 05/05/2017 0830   CO2 27 02/18/2017 1221   GLUCOSE 111 05/05/2017 0830   GLUCOSE 95 02/18/2017 1221   BUN 14 05/05/2017 0830   BUN 18.4 02/18/2017 1221   CREATININE 1.0 02/18/2017 1221   CALCIUM 9.4 05/05/2017 0830   CALCIUM 10.3 02/18/2017 1221   PROT 7.1 05/05/2017 0830   PROT 8.3 02/18/2017 1221   ALBUMIN 3.9 05/05/2017 0830   ALBUMIN 4.4 02/18/2017 1221   AST 20 05/05/2017 0830   AST 22 02/18/2017 1221   ALT 80 (H) 05/05/2017 0830   ALT 18 02/18/2017 1221   ALKPHOS 93 05/05/2017 0830   ALKPHOS 89 02/18/2017 1221   BILITOT 0.6 05/05/2017 0830   BILITOT 0.48 02/18/2017 1221   GFRNONAA >60 05/05/2017 0830   GFRAA >60 05/05/2017 0830    No results found for: TOTALPROTELP, ALBUMINELP, A1GS, A2GS, BETS, BETA2SER, GAMS, MSPIKE, SPEI  No results found for: Nils Pyle, [facility]  Lab Results  Component Value Date   WBC 1.4 (L) 05/05/2017   NEUTROABS 0.4 (LL) 05/05/2017   HGB 13.7 03/23/2017   HCT 37.3 05/05/2017   MCV 88.2 05/05/2017   PLT 151 05/05/2017      Chemistry      Component Value Date/Time   NA 133 (L) 05/05/2017 0830   NA 142 02/18/2017 1221   K 4.0 05/05/2017 0830   K 4.6 02/18/2017 1221   CL 98 05/05/2017 0830   CO2 26 05/05/2017 0830   CO2 27 02/18/2017 1221   BUN 14 05/05/2017 0830   BUN 18.4 02/18/2017 1221   CREATININE 1.0 02/18/2017 1221      Component Value Date/Time   CALCIUM 9.4 05/05/2017 0830   CALCIUM 10.3 02/18/2017 1221   ALKPHOS 93 05/05/2017 0830   ALKPHOS 89 02/18/2017 1221   AST 20 05/05/2017 0830   AST 22 02/18/2017 1221   ALT 80 (H) 05/05/2017 0830   ALT 18 02/18/2017 1221   BILITOT 0.6 05/05/2017 0830   BILITOT 0.48 02/18/2017 1221       No results found for: LABCA2  No components found for: LNLGXQ119  No results for input(s): INR in the last 168  hours.  No results found for: LABCA2  No results found for: ERD408  No results found for: XKG818  No results found for: HUD149  No results found for: CA2729  No components found for: HGQUANT  No results found for: CEA1 /  No results found for: CEA1   No results found for: AFPTUMOR  No results found for: CHROMOGRNA  No results found for: PSA1  No visits with results within 3 Day(s) from this visit.  Latest known visit with results is:  Appointment on 05/05/2017  Component Date Value Ref Range Status  . WBC Count 05/05/2017 1.4* 3.9 - 10.3 K/uL Final  . RBC 05/05/2017 4.23  3.70 - 5.45 MIL/uL Final  . Hemoglobin 05/05/2017 12.7  11.6 - 15.9 g/dL Final  . HCT 05/05/2017 37.3  34.8 - 46.6 % Final  . MCV 05/05/2017 88.2  79.5 - 101.0 fL Final  . MCH 05/05/2017 30.1  25.1 - 34.0 pg Final  . MCHC 05/05/2017 34.1  31.5 - 36.0 g/dL Final  . RDW 05/05/2017 12.3  11.2 - 16.1 % Final  . Platelet Count 05/05/2017 151  145 - 400 K/uL Final  . Neutrophils Relative % 05/05/2017 28  % Final  . Neutro Abs 05/05/2017 0.4* 1.5 - 6.5 K/uL Final   Comment: Unsuccessful Call: ANEU 05/05/2017 09:16 AM to [place] [facility] ([phone]/) by [id number]. Successful Call: ANEU called 05/05/2017 09:58 AM to [place] ([phone]/[email or web address],[id number] C) by [id number]. Read Back: Yes Comments: CRITICAL RESULT CALLED TO, READ BACK BY AND VERIFIED WITH: VAL     . Lymphocytes Relative 05/05/2017 54  % Final  . Lymphs Abs 05/05/2017 0.7* 0.9 - 3.3 K/uL Final  . Monocytes Relative 05/05/2017 5  % Final  . Monocytes Absolute 05/05/2017 0.1  0.1 - 0.9 K/uL Final  . Eosinophils Relative 05/05/2017 12  % Final  . Eosinophils Absolute 05/05/2017 0.2  0.0 - 0.5 K/uL Final  . Basophils Relative 05/05/2017 1  % Final  . Basophils Absolute 05/05/2017 0.0  0.0 - 0.1 K/uL Final   Performed at [facility], [place] [address], [place], [place] [address]  . Sodium 05/05/2017 133* 136 - 145 mmol/L  Final  . Potassium 05/05/2017 4.0  3.3 - 4.7 mmol/L Final  . Chloride 05/05/2017 98  98 - 109 mmol/L Final  . CO2 05/05/2017 26  22 - 29 mmol/L Final  . Glucose, Bld 05/05/2017 111  70 - 140 mg/dL Final  . BUN 05/05/2017 14  7 - 26 mg/dL Final  . Creatinine 05/05/2017 0.84  0.60 - 1.10 mg/dL Final  . Calcium 05/05/2017 9.4  8.4 - 10.4 mg/dL Final  . Total Protein 05/05/2017 7.1  6.4 - 8.3 g/dL Final  . Albumin 05/05/2017 3.9  3.5 - 5.0 g/dL Final  . AST 05/05/2017 20  5 - 34 U/L Final  . ALT 05/05/2017 80* 0 - 55 U/L Final  . Alkaline Phosphatase 05/05/2017 93  40 - 150 U/L Final  . Total Bilirubin 05/05/2017 0.6  0.2 - 1.2 mg/dL Final  . GFR, Est Non Af Am 05/05/2017 >60  >60 mL/min Final  . GFR, Est AFR Am 05/05/2017 >60  >60 mL/min Final   Comment: (NOTE) The eGFR has been calculated using the CKD EPI equation. This calculation has not been validated in all clinical situations. eGFR's persistently <60 mL/min signify possible Chronic Kidney Disease.   Georgiann Hahn gap 05/05/2017 9  3 - 11 Final   Performed at [facility], [place] [address], [place], [place] [address]    (this displays the last labs from the last 3 days)  No results found for: TOTALPROTELP, ALBUMINELP, A1GS, A2GS, BETS, BETA2SER, GAMS, MSPIKE, SPEI (this displays SPEP labs)  No results found for: KPAFRELGTCHN,  LAMBDASER, KAPLAMBRATIO (kappa/lambda light chains)  No results found for: HGBA, HGBA2QUANT, HGBFQUANT, HGBSQUAN (Hemoglobinopathy evaluation)   No results found for: LDH  No results found for: IRON, TIBC, IRONPCTSAT (Iron and TIBC)  No results found for: FERRITIN  Urinalysis No results found for: COLORURINE, APPEARANCEUR, LABSPEC, PHURINE, GLUCOSEU, HGBUR, BILIRUBINUR, KETONESUR, PROTEINUR, UROBILINOGEN, NITRITE, LEUKOCYTESUR   STUDIES: No results found.  ELIGIBLE FOR AVAILABLE RESEARCH PROTOCOL: UPBEAT  ASSESSMENT: 52 y.o. [place] woman status post right breast lower  inner quadrant biopsy February 09, 2017 for a clinical T1b N0, stage IB invasive ductal carcinoma, grade 3, triple negative  (1) status post right lumpectomy 03/23/2017 for a pT1b pN0, stage IB invasive ductal carcinoma, grade 3, with negative margins.  (2) adjuvant chemotherapy consisting of cyclophosphamide and doxorubicin in dose dense fashion x4 to start 04/28/2017, followed by weekly paclitaxel x12  (does not qualify for BR003)  (a) echocardiogram 02/25/2017 showed a baseline ejection fraction in the 60-65%  (3) adjuvant radiation to follow  (4) genetics testing 02/19/2017 through the STAT Breast panel with reflext to Multi-Cancer panel (83 genes) @ [facility] - No pathogenic mutations detected in ALK, APC, ATM, AXIN2, BAP1, BARD1, BLM, BMPR1A, BRCA1, BRCA2, BRIP1, CASR, CDC73, CDH1, CDK4, CDKN1B, CDKN1C, CDKN2A, CEBPA, CHEK2, CTNNA1, DICER1, DIS3L2, EGFR, EPCAM, FH, FLCN, GATA2, GPC3, GREM1, HOXB13, HRAS, KIT, MAX, MEN1, MET, MITF, MLH1, MSH2, MSH3, MSH6, MUTYH, NBN, NF1, NF2, NTHL1, PALB2, PDGFRA, PHOX2B, PMS2, POLD1, POLE, POT1, PRKAR1A, PTCH1, PTEN, RAD50, RAD51C, RAD51D, RB1, RECQL4, RET, RUNX1, SDHA, SDHAF2, SDHB, SDHC, SDHD, SMAD4, SMARCA4, SMARCB1, SMARCE1, STK11, SUFU, TERC, TERT, TMEM127, TP53, TSC1, TSC2, VHL, WRN, WT1.  (a) Variants of Uncertain Significance in PDGFRA c.3040G>A (p.Ala1014Thr) and SMARCA4 c.2044C>G (p.Leu682Val) noted  PLAN:  Bradee is doing well today.  Her labs have recovered and rebounded from her Neulasta and I reviewed her CBC with her in detail.  She will proceed with her second cycle of Doxorubicin and Cyclophosphamide today.  She was recommended to continue taking a stool softener for her bowels today.  She was recommended to continue to apply hemorrhoid cream to her anus, and stop using suppositories.  Should the fullness sensation continue or worsen, she was recommended to call us. If it worsens she may need to see GI.  She will return next week for labs/eval.     A total of (30) minutes of face-to-face time was spent with this patient with greater than 50% of that time in counseling and care-coordination.   Wilber Bihari, NP  05/12/17 8:27 AM Medical Oncology and Hematology [facility] [address] [place], [place] [address] Tel. [phone]    Fax. [phone]

## 2017-05-12 NOTE — Progress Notes (Signed)
[place]  Telephone:([phone]) [phone] Fax:(336) [phone]     ID: Kelsey Henry DOB: 1965/07/25  MR#: [id number]  KAJ#:[id number]  Patient Care Team: Patient, No Pcp Per as PCP - General (General Practice) Mcihael Hinderman, Virgie Dad, MD as Consulting Physician (Oncology) Kyung Rudd, MD as Consulting Physician (Radiation Oncology) Rolm Bookbinder, MD as Consulting Physician (General Surgery) Meisinger, Sherren Mocha, MD as Consulting Physician (Obstetrics and Gynecology) Loletta Specter, MD (Unknown Physician Specialty) OTHER MD:  CHIEF COMPLAINT: triple negative breast cancer  CURRENT TREATMENT: adjuvant chemotherapy   HISTORY OF CURRENT ILLNESS: From the original intake note:  Kelsey Henry had routine screening mammography October 2018 showing a possible right breast mass.  She was scheduled for unilateral right mammography and tomography with right breast ultrasonography at the breast center February 06, 2017.  This showed the breast density to be category D.  In the lower inner quadrant of the right breast there was a 0.6 cm oval mass, which by ultrasound measured 0.9 cm and was irregular and hypoechoic.  Ultrasound of the right axilla was sonographically benign.  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  The patient's subsequent history is as detailed below.  INTERVAL HISTORY: Kelsey Henry returns today for follow-up and treatment of her triple negative breast cancer. She is currently treated adjuvantly with cyclophosphamide and doxorubicin given every 14 days, today being day 8 cycle 2 of 4 planned cycles. She reports that she tolerated this cycle very well.    REVIEW OF SYSTEMS: Kelsey Henry reports that her constipation was alleviated with stool  softeners. She denies mouth sores, but reports that she has mild change in taste. For exercise, she tries to walk a mile daily as well as strength training and cardio. She has an increase in her hot flashes. She denies neuropathy. She reports having a "funny" sensation to her left breast. She denies unusual headaches, visual changes, nausea, vomiting, or dizziness. There has been no unusual cough, phlegm production, or pleurisy. This been no change in bowel or bladder habits. She denies unexplained fatigue or unexplained weight loss, bleeding, rash, or fever. A detailed review of systems was otherwise stable.     PAST MEDICAL HISTORY: Past Medical History:  Diagnosis Date  . Cancer [facility])    breast  . Genetic testing 02/19/2017   STAT Breast panel with reflext to Multi-Cancer panel (83 genes) @ [facility] - No pathogenic mutations detected  . GERD (gastroesophageal reflux disease)    no meds  . Seasonal allergies   . SVD (spontaneous vaginal delivery)    x 1    PAST SURGICAL HISTORY: Past Surgical History:  Procedure Laterality Date  . BILATERAL SALPINGECTOMY Bilateral 06/23/2012   Procedure: BILATERAL SALPINGECTOMY;  Surgeon: Cheri Fowler, MD;  Location: [facility];  Service: Gynecology;  Laterality: Bilateral;  . BREAST LUMPECTOMY WITH RADIOACTIVE SEED AND SENTINEL LYMPH NODE BIOPSY Right 03/23/2017   Procedure: BREAST LUMPECTOMY WITH RADIOACTIVE SEED AND SENTINEL LYMPH NODE BIOPSY;  Surgeon: Rolm Bookbinder, MD;  Location: [place];  Service: General;  Laterality: Right;  . LAPAROSCOPIC SUPRACERVICAL HYSTERECTOMY N/A 06/23/2012   Procedure: LAPAROSCOPIC SUPRACERVICAL HYSTERECTOMY;  Surgeon: Cheri Fowler, MD;  Location: [facility];  Service: Gynecology;  Laterality: N/A;  . PORTACATH PLACEMENT Right 03/23/2017   Procedure: INSERTION PORT-A-CATH;  Surgeon: Rolm Bookbinder, MD;  Location: [facility];  Service: General;  Laterality: Right;  . TUBAL LIGATION    . VULVAR LESION REMOVAL N/A 11/01/2014     Procedure: MONS PUBIS LESION;  Surgeon: Cheri Fowler, MD;  Location: [facility];  Service: Gynecology;  Laterality: N/A;  local anesthesia with IV sedation if needed  . WISDOM TOOTH EXTRACTION      FAMILY HISTORY No family history on file.The patient has no information regarding her father or his side of the family.  The patient's mother is 74 years old as of November 2018.  The patient has 3 brothers, 2 sisters.  One sister had 2 "benign tumors" removed from the right breast at the age of 60.  A maternal grandfather had prostate cancer in his 17s.  GYNECOLOGIC HISTORY:  Patient's last menstrual period was 05/26/2012. Menarche age 79, first live birth age 62, the patient is GX P1.  She stopped having periods 2012 when she underwent hysterectomy, without salpingo-oophorectomy..  She used hormone replacement for approximately 3 months.  She is used oral contraceptives remotely with no complications.  SOCIAL HISTORY:  Kelsey Henry works as an Psychologist, educational for [employer], in the paternal DNA subsection.  Her husband Kelsey Henry is an Clinical biochemist.  There is some [name] is an Producer, television/film/video and lives in [place].  The patient has no grandchildren.  She is a Psychologist, forensic.    ADVANCED DIRECTIVES: Not in place   HEALTH MAINTENANCE: Social History   Tobacco Use  . Smoking status: Never Smoker  . Smokeless tobacco: Never Used  Substance Use Topics  . Alcohol use: No  . Drug use: No     Colonoscopy: n/a  PAP: Status post hysterectomy  Bone density: n/a   Allergies  Allergen Reactions  . Aspirin Hives    Current Outpatient Medications  Medication Sig Dispense Refill  . dexamethasone (DECADRON) 4 MG tablet Take 2 tablets by mouth once a day on the day after chemotherapy and then take 2 tablets two times a day for 2 days. Take with food. 30 tablet 1  . lidocaine-prilocaine (EMLA) cream Apply to affected area once 30 g 3  . LORazepam (ATIVAN) 0.5 MG tablet Take 1 tablet (0.5 mg total) by mouth at bedtime as  needed (Nausea or vomiting). 30 tablet 0  . Multiple Vitamins-Minerals (MULTIVITAMIN ADULT) CHEW Chew 2 % by mouth.    Marland Kitchen omeprazole (PRILOSEC) 40 MG capsule Take 1 capsule (40 mg total) by mouth daily. 20 capsule 0  . ondansetron (ZOFRAN) 8 MG tablet Take 1 tablet (8 mg total) by mouth every 8 (eight) hours as needed for nausea or vomiting. 20 tablet 0  . prochlorperazine (COMPAZINE) 10 MG tablet Take 1 tablet (10 mg total) by mouth every 6 (six) hours as needed (Nausea or vomiting). 30 tablet 1  . venlafaxine XR (EFFEXOR-XR) 75 MG 24 hr capsule Take 1 capsule (75 mg total) by mouth daily with breakfast. 30 capsule 6   No current facility-administered medications for this visit.     OBJECTIVE: Middle-aged African-American woman in no acute distress  Vitals:   05/19/17 1023  BP: 114/70  Pulse: 100  Resp: 18  Temp: 99.1 F (37.3 C)  SpO2: 100%     Body mass index is 23.98 kg/m.   Wt Readings from Last 3 Encounters:  05/19/17 126 lb 14.4 oz (57.6 kg)  05/12/17 127 lb 11.2 oz (57.9 kg)  05/05/17 125 lb 3.2 oz (56.8 kg)      ECOG FS:1 - Symptomatic but completely ambulatory  Sclerae unicteric, pupils round and equal Oropharynx clear and moist No cervical or supraclavicular adenopathy Lungs no rales or rhonchi Heart regular rate and rhythm Abd soft, nontender, positive bowel sounds MSK no focal spinal tenderness, no upper extremity lymphedema Neuro: nonfocal, well oriented, appropriate affect Breasts: The right breast is status post lumpectomy.  I do not palpate any mass, and there are no skin or nipple changes of concern.  The left breast is unremarkable.  Both axillae are benign.  LAB RESULTS:  CMP     Component Value Date/Time   NA 140 05/12/2017 0853   NA 142 02/18/2017 1221   K 3.7 05/12/2017 0853   K 4.6 02/18/2017 1221   CL 105 05/12/2017 0853   CO2 27 05/12/2017 0853   CO2 27 02/18/2017 1221   GLUCOSE 132 05/12/2017 0853   GLUCOSE 95 02/18/2017 1221   BUN 22  05/12/2017 0853   BUN 18.4 02/18/2017 1221   CREATININE 1.0 02/18/2017 1221   CALCIUM 9.8 05/12/2017 0853   CALCIUM 10.3 02/18/2017 1221   PROT 7.6 05/12/2017 0853   PROT 8.3 02/18/2017 1221   ALBUMIN 4.1 05/12/2017 0853   ALBUMIN 4.4 02/18/2017 1221   AST 21 05/12/2017 0853   AST 22 02/18/2017 1221   ALT 48 05/12/2017 0853   ALT 18 02/18/2017 1221   ALKPHOS 132 05/12/2017 0853   ALKPHOS 89 02/18/2017 1221   BILITOT <0.2 (L) 05/12/2017 0853   BILITOT 0.48 02/18/2017 1221   GFRNONAA >60 05/12/2017 0853   GFRAA >60 05/12/2017 0853    No results found for: Ronnald Ramp, A1GS, A2GS, BETS, BETA2SER, GAMS, MSPIKE, SPEI  No results found for: Nils Pyle, [facility]  Lab Results  Component Value Date   WBC 1.3 (L) 05/19/2017   NEUTROABS 0.3 (LL) 05/19/2017   HGB 13.7 03/23/2017   HCT 36.7 05/19/2017   MCV 88.9 05/19/2017   PLT 231 05/19/2017      Chemistry      Component Value Date/Time   NA 140 05/12/2017 0853   NA 142 02/18/2017 1221   K 3.7 05/12/2017 0853   K 4.6 02/18/2017 1221   CL 105 05/12/2017 0853   CO2 27 05/12/2017 0853   CO2 27 02/18/2017 1221   BUN 22 05/12/2017 0853   BUN 18.4 02/18/2017 1221   CREATININE 1.0 02/18/2017 1221      Component Value Date/Time   CALCIUM 9.8 05/12/2017 0853   CALCIUM 10.3 02/18/2017 1221   ALKPHOS 132 05/12/2017 0853   ALKPHOS 89 02/18/2017 1221   AST 21 05/12/2017 0853   AST 22 02/18/2017 1221   ALT 48 05/12/2017 0853   ALT 18 02/18/2017 1221   BILITOT <0.2 (L) 05/12/2017 0853   BILITOT 0.48 02/18/2017 1221       No results found for: LABCA2  No components found for: QIWLNL892  No results for input(s): INR in the last 168 hours.  No results found for: LABCA2  No results found for: JJH417  No results found for: EYC144  No results found for: YJE563  No results found for: CA2729  No components found for: HGQUANT  No results found for: CEA1 / No results found for: CEA1   No  results found for: AFPTUMOR  No results found for: CHROMOGRNA  No results found for: PSA1  Appointment on 05/19/2017  Component Date Value Ref Range Status  . WBC Count 05/19/2017 1.3* 3.9 - 10.3 K/uL Final  . RBC 05/19/2017 4.13  3.70 - 5.45 MIL/uL Final  .  Hemoglobin 05/19/2017 12.5  11.6 - 15.9 g/dL Final  . HCT 05/19/2017 36.7  34.8 - 46.6 % Final  . MCV 05/19/2017 88.9  79.5 - 101.0 fL Final  . MCH 05/19/2017 30.3  25.1 - 34.0 pg Final  . MCHC 05/19/2017 34.1  31.5 - 36.0 g/dL Final  . RDW 05/19/2017 12.6  11.2 - 14.5 % Final  . Platelet Count 05/19/2017 231  145 - 400 K/uL Final  . Neutrophils Relative % 05/19/2017 20  % Final  . Neutro Abs 05/19/2017 0.3* 1.5 - 6.5 K/uL Final  . Lymphocytes Relative 05/19/2017 64  % Final  . Lymphs Abs 05/19/2017 0.8* 0.9 - 3.3 K/uL Final  . Monocytes Relative 05/19/2017 12  % Final  . Monocytes Absolute 05/19/2017 0.2  0.1 - 0.9 K/uL Final  . Eosinophils Relative 05/19/2017 3  % Final  . Eosinophils Absolute 05/19/2017 0.0  0.0 - 0.5 K/uL Final  . Basophils Relative 05/19/2017 1  % Final  . Basophils Absolute 05/19/2017 0.0  0.0 - 0.1 K/uL Final   Performed at [facility], [place] [address] [place], [place] [address]    (this displays the last labs from the last 3 days)  No results found for: TOTALPROTELP, ALBUMINELP, A1GS, A2GS, BETS, BETA2SER, GAMS, MSPIKE, SPEI (this displays SPEP labs)  No results found for: KPAFRELGTCHN, LAMBDASER, KAPLAMBRATIO (kappa/lambda light chains)  No results found for: HGBA, HGBA2QUANT, HGBFQUANT, HGBSQUAN (Hemoglobinopathy evaluation)   No results found for: LDH  No results found for: IRON, TIBC, IRONPCTSAT (Iron and TIBC)  No results found for: FERRITIN  Urinalysis No results found for: COLORURINE, APPEARANCEUR, LABSPEC, PHURINE, GLUCOSEU, HGBUR, BILIRUBINUR, KETONESUR, PROTEINUR, UROBILINOGEN, NITRITE, LEUKOCYTESUR   STUDIES: No results found.  ELIGIBLE FOR  AVAILABLE RESEARCH PROTOCOL: UPBEAT  ASSESSMENT: 52 y.o. [place] woman status post right breast lower inner quadrant biopsy February 09, 2017 for a clinical T1b N0, stage IB invasive ductal carcinoma, grade 3, triple negative  (1) status post right lumpectomy 03/23/2017 for a pT1b pN0, stage IB invasive ductal carcinoma, grade 3, with negative margins.  (2) adjuvant chemotherapy consisting of cyclophosphamide and doxorubicin in dose dense fashion x4 to start 04/28/2017, followed by weekly paclitaxel x12  (does not qualify for BR003)  (a) echocardiogram 02/25/2017 showed a baseline ejection fraction in the 60-65%  (3) adjuvant radiation to follow  (4) genetics testing 02/19/2017 through the STAT Breast panel with reflext to Multi-Cancer panel (83 genes) @ [facility] - No pathogenic mutations detected in ALK, APC, ATM, AXIN2, BAP1, BARD1, BLM, BMPR1A, BRCA1, BRCA2, BRIP1, CASR, CDC73, CDH1, CDK4, CDKN1B, CDKN1C, CDKN2A, CEBPA, CHEK2, CTNNA1, DICER1, DIS3L2, EGFR, EPCAM, FH, FLCN, GATA2, GPC3, GREM1, HOXB13, HRAS, KIT, MAX, MEN1, MET, MITF, MLH1, MSH2, MSH3, MSH6, MUTYH, NBN, NF1, NF2, NTHL1, PALB2, PDGFRA, PHOX2B, PMS2, POLD1, POLE, POT1, PRKAR1A, PTCH1, PTEN, RAD50, RAD51C, RAD51D, RB1, RECQL4, RET, RUNX1, SDHA, SDHAF2, SDHB, SDHC, SDHD, SMAD4, SMARCA4, SMARCB1, SMARCE1, STK11, SUFU, TERC, TERT, TMEM127, TP53, TSC1, TSC2, VHL, WRN, WT1.  (a) Variants of Uncertain Significance in PDGFRA c.3040G>A (p.Ala1014Thr) and SMARCA4 c.2044C>G (p.Leu682Val) noted  PLAN: Tiana did much Henry with cycle 2.  The only real side effect she is having at this point is mild fatigue.  On the other hand she is starting to have much more intense and frequent hot flashes.  This is doubtless due to her ovaries not working from the chemotherapy.  She understands this may be permanent or temporary.  Her ANC is less than 500.  I am  going to prophylax her with Cipro 500 mg twice daily for 5 days.  She has a good  understanding of the possible side effects of this medication and that particularly if she gets any stomach upset or any joint issues she will call us.  We discussed the use of venlafaxine for hot flashes.  She has a good understanding of the possible toxicities side effects and complications and also the fact that if the drug does not work but she has been taking it for several weeks she should not simply stop it but will have to taper it off gradually.  I gone ahead and placed the prescription for her.  She will return for cycle 3 and a week.  She will see my 74 assistant 1 day 8 and then she will see me with day 1 of cycle 4 at which time I will set her up for her remaining treatments  She knows to call for any other issues that may develop before the next visit.  Shields Pautz, Virgie Dad, MD  05/19/17 10:44 AM Medical Oncology and Hematology [facility] [address] [place], [place] [address] Tel. [phone]    Fax. [phone]    This document serves as a record of services personally performed by Lurline Del, MD. It was created on his behalf by Steva Colder, a trained medical scribe. The creation of this record is based on the scribe's personal observations and the provider's statements to them.   I have reviewed the above documentation for accuracy and completeness, and I agree with the above.

## 2017-05-13 ENCOUNTER — Inpatient Hospital Stay: Payer: BLUE CROSS/BLUE SHIELD

## 2017-05-13 DIAGNOSIS — Z5189 Encounter for other specified aftercare: Secondary | ICD-10-CM | POA: Diagnosis not present

## 2017-05-13 DIAGNOSIS — C50311 Malignant neoplasm of lower-inner quadrant of right female breast: Secondary | ICD-10-CM | POA: Diagnosis not present

## 2017-05-13 DIAGNOSIS — Z5111 Encounter for antineoplastic chemotherapy: Secondary | ICD-10-CM | POA: Diagnosis not present

## 2017-05-13 DIAGNOSIS — Z171 Estrogen receptor negative status [ER-]: Principal | ICD-10-CM

## 2017-05-13 DIAGNOSIS — K219 Gastro-esophageal reflux disease without esophagitis: Secondary | ICD-10-CM | POA: Diagnosis not present

## 2017-05-13 MED ORDER — PEGFILGRASTIM INJECTION 6 MG/0.6ML ~~LOC~~
6.0000 mg | PREFILLED_SYRINGE | Freq: Once | SUBCUTANEOUS | Status: AC
  Administered 2017-05-13: 6 mg via SUBCUTANEOUS

## 2017-05-13 MED ORDER — PEGFILGRASTIM INJECTION 6 MG/0.6ML ~~LOC~~
PREFILLED_SYRINGE | SUBCUTANEOUS | Status: AC
  Filled 2017-05-13: qty 0.6

## 2017-05-13 NOTE — Patient Instructions (Signed)
Pegfilgrastim injection What is this medicine? PEGFILGRASTIM (PEG fil gra stim) is a long-acting granulocyte colony-stimulating factor that stimulates the growth of neutrophils, a type of white blood cell important in the body's fight against infection. It is used to reduce the incidence of fever and infection in patients with certain types of cancer who are receiving chemotherapy that affects the bone marrow, and to increase survival after being exposed to high doses of radiation. This medicine may be used for other purposes; ask your health care provider or pharmacist if you have questions. COMMON BRAND NAME(S): Neulasta What should I tell my health care provider before I take this medicine? They need to know if you have any of these conditions: -kidney disease -latex allergy -ongoing radiation therapy -sickle cell disease -skin reactions to acrylic adhesives (On-Body Injector only) -an unusual or allergic reaction to pegfilgrastim, filgrastim, other medicines, foods, dyes, or preservatives -pregnant or trying to get pregnant -breast-feeding How should I use this medicine? This medicine is for injection under the skin. If you get this medicine at home, you will be taught how to prepare and give the pre-filled syringe or how to use the On-body Injector. Refer to the patient Instructions for Use for detailed instructions. Use exactly as directed. Tell your healthcare provider immediately if you suspect that the On-body Injector may not have performed as intended or if you suspect the use of the On-body Injector resulted in a missed or partial dose. It is important that you put your used needles and syringes in a special sharps container. Do not put them in a trash can. If you do not have a sharps container, call your pharmacist or healthcare provider to get one. Talk to your pediatrician regarding the use of this medicine in children. While this drug may be prescribed for selected conditions,  precautions do apply. Overdosage: If you think you have taken too much of this medicine contact a poison control center or emergency room at once. NOTE: This medicine is only for you. Do not share this medicine with others. What if I miss a dose? It is important not to miss your dose. Call your doctor or health care professional if you miss your dose. If you miss a dose due to an On-body Injector failure or leakage, a new dose should be administered as soon as possible using a single prefilled syringe for manual use. What may interact with this medicine? Interactions have not been studied. Give your health care provider a list of all the medicines, herbs, non-prescription drugs, or dietary supplements you use. Also tell them if you smoke, drink alcohol, or use illegal drugs. Some items may interact with your medicine. This list may not describe all possible interactions. Give your health care provider a list of all the medicines, herbs, non-prescription drugs, or dietary supplements you use. Also tell them if you smoke, drink alcohol, or use illegal drugs. Some items may interact with your medicine. What should I watch for while using this medicine? You may need blood work done while you are taking this medicine. If you are going to need a MRI, CT scan, or other procedure, tell your doctor that you are using this medicine (On-Body Injector only). What side effects may I notice from receiving this medicine? Side effects that you should report to your doctor or health care professional as soon as possible: -allergic reactions like skin rash, itching or hives, swelling of the face, lips, or tongue -dizziness -fever -pain, redness, or irritation at site   where injected -pinpoint red spots on the skin -red or dark-brown urine -shortness of breath or breathing problems -stomach or side pain, or pain at the shoulder -swelling -tiredness -trouble passing urine or change in the amount of urine Side  effects that usually do not require medical attention (report to your doctor or health care professional if they continue or are bothersome): -bone pain -muscle pain This list may not describe all possible side effects. Call your doctor for medical advice about side effects. You may report side effects to FDA at 1-800-FDA-1088. Where should I keep my medicine? Keep out of the reach of children. Store pre-filled syringes in a refrigerator between 2 and 8 degrees C (36 and 46 degrees F). Do not freeze. Keep in carton to protect from light. Throw away this medicine if it is left out of the refrigerator for more than 48 hours. Throw away any unused medicine after the expiration date. NOTE: This sheet is a summary. It may not cover all possible information. If you have questions about this medicine, talk to your doctor, pharmacist, or health care provider.  2018 Elsevier/Gold Standard (2016-03-27 12:58:03)  

## 2017-05-19 ENCOUNTER — Inpatient Hospital Stay: Payer: BLUE CROSS/BLUE SHIELD | Attending: Oncology | Admitting: Oncology

## 2017-05-19 ENCOUNTER — Telehealth: Payer: Self-pay | Admitting: Oncology

## 2017-05-19 ENCOUNTER — Inpatient Hospital Stay: Payer: BLUE CROSS/BLUE SHIELD

## 2017-05-19 VITALS — BP 114/70 | HR 100 | Temp 99.1°F | Resp 18 | Ht 61.0 in | Wt 126.9 lb

## 2017-05-19 DIAGNOSIS — Z171 Estrogen receptor negative status [ER-]: Principal | ICD-10-CM

## 2017-05-19 DIAGNOSIS — Z5111 Encounter for antineoplastic chemotherapy: Secondary | ICD-10-CM | POA: Diagnosis not present

## 2017-05-19 DIAGNOSIS — C50311 Malignant neoplasm of lower-inner quadrant of right female breast: Secondary | ICD-10-CM

## 2017-05-19 DIAGNOSIS — D709 Neutropenia, unspecified: Secondary | ICD-10-CM | POA: Insufficient documentation

## 2017-05-19 DIAGNOSIS — D701 Agranulocytosis secondary to cancer chemotherapy: Secondary | ICD-10-CM

## 2017-05-19 DIAGNOSIS — Z5189 Encounter for other specified aftercare: Secondary | ICD-10-CM | POA: Insufficient documentation

## 2017-05-19 DIAGNOSIS — R5383 Other fatigue: Secondary | ICD-10-CM | POA: Insufficient documentation

## 2017-05-19 DIAGNOSIS — T451X5A Adverse effect of antineoplastic and immunosuppressive drugs, initial encounter: Secondary | ICD-10-CM

## 2017-05-19 DIAGNOSIS — N951 Menopausal and female climacteric states: Secondary | ICD-10-CM | POA: Diagnosis not present

## 2017-05-19 LAB — CBC WITH DIFFERENTIAL (CANCER CENTER ONLY)
BASOS ABS: 0 10*3/uL (ref 0.0–0.1)
BASOS PCT: 1 %
Eosinophils Absolute: 0 10*3/uL (ref 0.0–0.5)
Eosinophils Relative: 3 %
HEMATOCRIT: 36.7 % (ref 34.8–46.6)
Hemoglobin: 12.5 g/dL (ref 11.6–15.9)
LYMPHS PCT: 64 %
Lymphs Abs: 0.8 10*3/uL — ABNORMAL LOW (ref 0.9–3.3)
MCH: 30.3 pg (ref 25.1–34.0)
MCHC: 34.1 g/dL (ref 31.5–36.0)
MCV: 88.9 fL (ref 79.5–101.0)
Monocytes Absolute: 0.2 10*3/uL (ref 0.1–0.9)
Monocytes Relative: 12 %
NEUTROS ABS: 0.3 10*3/uL — AB (ref 1.5–6.5)
Neutrophils Relative %: 20 %
Platelet Count: 231 10*3/uL (ref 145–400)
RBC: 4.13 MIL/uL (ref 3.70–5.45)
RDW: 12.6 % (ref 11.2–14.5)
WBC Count: 1.3 10*3/uL — ABNORMAL LOW (ref 3.9–10.3)

## 2017-05-19 LAB — CMP (CANCER CENTER ONLY)
ALBUMIN: 4.2 g/dL (ref 3.5–5.0)
ALK PHOS: 142 U/L (ref 40–150)
ALT: 82 U/L — AB (ref 0–55)
AST: 25 U/L (ref 5–34)
Anion gap: 9 (ref 3–11)
BILIRUBIN TOTAL: 0.4 mg/dL (ref 0.2–1.2)
BUN: 17 mg/dL (ref 7–26)
CHLORIDE: 101 mmol/L (ref 98–109)
CO2: 28 mmol/L (ref 22–29)
Calcium: 10.1 mg/dL (ref 8.4–10.4)
Creatinine: 0.86 mg/dL (ref 0.60–1.10)
GFR, Est AFR Am: >60 mL/min (ref >60)
Glucose, Bld: 78 mg/dL (ref 70–140)
POTASSIUM: 4.2 mmol/L (ref 3.5–5.1)
Sodium: 138 mmol/L (ref 136–145)
Total Protein: 7.7 g/dL (ref 6.4–8.3)

## 2017-05-19 MED ORDER — CIPROFLOXACIN HCL 500 MG PO TABS: 500.0000 mg | ORAL_TABLET | Freq: Two times a day (BID) | ORAL | 0 refills | Status: DC

## 2017-05-19 MED ORDER — VENLAFAXINE HCL ER 75 MG PO CP24: 75.0000 mg | ORAL_CAPSULE | Freq: Every day | ORAL | 6 refills | Status: DC

## 2017-05-19 NOTE — Telephone Encounter (Signed)
Gave patient AVS and calendar of upcoming February and March appointments.  °

## 2017-05-21 ENCOUNTER — Other Ambulatory Visit: Payer: Self-pay | Admitting: Oncology

## 2017-05-26 ENCOUNTER — Inpatient Hospital Stay: Payer: BLUE CROSS/BLUE SHIELD

## 2017-05-26 ENCOUNTER — Encounter: Payer: Self-pay | Admitting: *Deleted

## 2017-05-26 VITALS — BP 140/85 | HR 88 | Temp 98.2°F | Resp 18

## 2017-05-26 DIAGNOSIS — C50311 Malignant neoplasm of lower-inner quadrant of right female breast: Secondary | ICD-10-CM | POA: Diagnosis not present

## 2017-05-26 DIAGNOSIS — Z95828 Presence of other vascular implants and grafts: Secondary | ICD-10-CM

## 2017-05-26 DIAGNOSIS — Z171 Estrogen receptor negative status [ER-]: Principal | ICD-10-CM

## 2017-05-26 DIAGNOSIS — Z5189 Encounter for other specified aftercare: Secondary | ICD-10-CM | POA: Diagnosis not present

## 2017-05-26 DIAGNOSIS — Z5111 Encounter for antineoplastic chemotherapy: Secondary | ICD-10-CM | POA: Diagnosis not present

## 2017-05-26 LAB — CBC WITH DIFFERENTIAL (CANCER CENTER ONLY)
Basophils Absolute: 0.4 10*3/uL — ABNORMAL HIGH (ref 0.0–0.1)
Basophils Relative: 2 %
Eosinophils Absolute: 0 10*3/uL (ref 0.0–0.5)
Eosinophils Relative: 0 %
HEMATOCRIT: 37.4 % (ref 34.8–46.6)
Hemoglobin: 12.6 g/dL (ref 11.6–15.9)
LYMPHS ABS: 2.7 10*3/uL (ref 0.9–3.3)
Lymphocytes Relative: 11 %
MCH: 30 pg (ref 25.1–34.0)
MCHC: 33.7 g/dL (ref 31.5–36.0)
MCV: 89.2 fL (ref 79.5–101.0)
MONO ABS: 0.6 10*3/uL (ref 0.1–0.9)
Monocytes Relative: 2 %
NEUTROS ABS: 21.1 10*3/uL — AB (ref 1.5–6.5)
Neutrophils Relative %: 85 %
Platelet Count: 246 10*3/uL (ref 145–400)
RBC: 4.19 MIL/uL (ref 3.70–5.45)
RDW: 13 % (ref 11.2–14.5)
WBC Count: 24.8 10*3/uL — ABNORMAL HIGH (ref 3.9–10.3)

## 2017-05-26 LAB — CMP (CANCER CENTER ONLY)
ALT: 49 U/L (ref 0–55)
ANION GAP: 12 — AB (ref 3–11)
AST: 19 U/L (ref 5–34)
Albumin: 4.1 g/dL (ref 3.5–5.0)
Alkaline Phosphatase: 160 U/L — ABNORMAL HIGH (ref 40–150)
BILIRUBIN TOTAL: 0.3 mg/dL (ref 0.2–1.2)
BUN: 21 mg/dL (ref 7–26)
CO2: 25 mmol/L (ref 22–29)
Calcium: 9.8 mg/dL (ref 8.4–10.4)
Chloride: 102 mmol/L (ref 98–109)
Creatinine: 0.9 mg/dL (ref 0.60–1.10)
GFR, Est AFR Am: >60 mL/min (ref >60)
Glucose, Bld: 127 mg/dL (ref 70–140)
POTASSIUM: 3.7 mmol/L (ref 3.5–5.1)
Sodium: 139 mmol/L (ref 136–145)
TOTAL PROTEIN: 7.4 g/dL (ref 6.4–8.3)

## 2017-05-26 MED ORDER — SODIUM CHLORIDE 0.9 % IV SOLN
Freq: Once | INTRAVENOUS | Status: AC
  Administered 2017-05-26: 09:00:00 via INTRAVENOUS

## 2017-05-26 MED ORDER — DOXORUBICIN HCL CHEMO IV INJECTION 2 MG/ML
60.0000 mg/m2 | Freq: Once | INTRAVENOUS | Status: AC
  Administered 2017-05-26: 94 mg via INTRAVENOUS
  Filled 2017-05-26: qty 47

## 2017-05-26 MED ORDER — SODIUM CHLORIDE 0.9 % IV SOLN
600.0000 mg/m2 | Freq: Once | INTRAVENOUS | Status: AC
  Administered 2017-05-26: 940 mg via INTRAVENOUS
  Filled 2017-05-26: qty 47

## 2017-05-26 MED ORDER — PALONOSETRON HCL INJECTION 0.25 MG/5ML
0.2500 mg | Freq: Once | INTRAVENOUS | Status: AC
  Administered 2017-05-26: 0.25 mg via INTRAVENOUS

## 2017-05-26 MED ORDER — PALONOSETRON HCL INJECTION 0.25 MG/5ML
INTRAVENOUS | Status: AC
  Filled 2017-05-26: qty 5

## 2017-05-26 MED ORDER — SODIUM CHLORIDE 0.9% FLUSH
10.0000 mL | INTRAVENOUS | Status: DC | PRN
  Administered 2017-05-26: 10 mL
  Filled 2017-05-26: qty 10

## 2017-05-26 MED ORDER — HEPARIN SOD (PORK) LOCK FLUSH 100 UNIT/ML IV SOLN
500.0000 [IU] | Freq: Once | INTRAVENOUS | Status: AC | PRN
  Administered 2017-05-26: 500 [IU]
  Filled 2017-05-26: qty 5

## 2017-05-26 MED ORDER — FOSAPREPITANT DIMEGLUMINE INJECTION 150 MG
Freq: Once | INTRAVENOUS | Status: AC
  Administered 2017-05-26: 09:00:00 via INTRAVENOUS
  Filled 2017-05-26: qty 5

## 2017-05-26 MED ORDER — SODIUM CHLORIDE 0.9% FLUSH
10.0000 mL | INTRAVENOUS | Status: DC | PRN
  Administered 2017-05-26: 10 mL via INTRAVENOUS
  Filled 2017-05-26: qty 10

## 2017-05-26 NOTE — Patient Instructions (Signed)
[facility] Discharge Instructions for Patients Receiving Chemotherapy  Today you received the following chemotherapy agents Adriamycin and Cytoxan  To help prevent nausea and vomiting after your treatment, we encourage you to take your nausea medication as directed.  If you develop nausea and vomiting that is not controlled by your nausea medication, call the clinic.   BELOW ARE SYMPTOMS THAT SHOULD BE REPORTED IMMEDIATELY:  *FEVER GREATER THAN 100.5 F  *CHILLS WITH OR WITHOUT FEVER  NAUSEA AND VOMITING THAT IS NOT CONTROLLED WITH YOUR NAUSEA MEDICATION  *UNUSUAL SHORTNESS OF BREATH  *UNUSUAL BRUISING OR BLEEDING  TENDERNESS IN MOUTH AND THROAT WITH OR WITHOUT PRESENCE OF ULCERS  *URINARY PROBLEMS  *BOWEL PROBLEMS  UNUSUAL RASH Items with * indicate a potential emergency and should be followed up as soon as possible.  Feel free to call the clinic should you have any questions or concerns. The clinic phone number is [phone].  Please show the CHEMO ALERT CARD at check-in to the Emergency Department and triage nurse.   

## 2017-05-26 NOTE — Patient Instructions (Signed)
Implanted Port Home Guide An implanted port is a type of central line that is placed under the skin. Central lines are used to provide IV access when treatment or nutrition needs to be given through a person's veins. Implanted ports are used for long-term IV access. An implanted port may be placed because:  You need IV medicine that would be irritating to the small veins in your hands or arms.  You need long-term IV medicines, such as antibiotics.  You need IV nutrition for a long period.  You need frequent blood draws for lab tests.  You need dialysis.  Implanted ports are usually placed in the chest area, but they can also be placed in the upper arm, the abdomen, or the leg. An implanted port has two main parts:  Reservoir. The reservoir is round and will appear as a small, raised area under your skin. The reservoir is the part where a needle is inserted to give medicines or draw blood.  Catheter. The catheter is a thin, flexible tube that extends from the reservoir. The catheter is placed into a large vein. Medicine that is inserted into the reservoir goes into the catheter and then into the vein.  How will I care for my incision site? Do not get the incision site wet. Bathe or shower as directed by your health care provider. How is my port accessed? Special steps must be taken to access the port:  Before the port is accessed, a numbing cream can be placed on the skin. This helps numb the skin over the port site.  Your health care provider uses a sterile technique to access the port. ? Your health care provider must put on a mask and sterile gloves. ? The skin over your port is cleaned carefully with an antiseptic and allowed to dry. ? The port is gently pinched between sterile gloves, and a needle is inserted into the port.  Only "non-coring" port needles should be used to access the port. Once the port is accessed, a blood return should be checked. This helps ensure that the port  is in the vein and is not clogged.  If your port needs to remain accessed for a constant infusion, a clear (transparent) bandage will be placed over the needle site. The bandage and needle will need to be changed every week, or as directed by your health care provider.  Keep the bandage covering the needle clean and dry. Do not get it wet. Follow your health care provider's instructions on how to take a shower or bath while the port is accessed.  If your port does not need to stay accessed, no bandage is needed over the port.  What is flushing? Flushing helps keep the port from getting clogged. Follow your health care provider's instructions on how and when to flush the port. Ports are usually flushed with saline solution or a medicine called heparin. The need for flushing will depend on how the port is used.  If the port is used for intermittent medicines or blood draws, the port will need to be flushed: ? After medicines have been given. ? After blood has been drawn. ? As part of routine maintenance.  If a constant infusion is running, the port may not need to be flushed.  How long will my port stay implanted? The port can stay in for as long as your health care provider thinks it is needed. When it is time for the port to come out, surgery will be   done to remove it. The procedure is similar to the one performed when the port was put in. When should I seek immediate medical care? When you have an implanted port, you should seek immediate medical care if:  You notice a bad smell coming from the incision site.  You have swelling, redness, or drainage at the incision site.  You have more swelling or pain at the port site or the surrounding area.  You have a fever that is not controlled with medicine.  This information is not intended to replace advice given to you by your health care provider. Make sure you discuss any questions you have with your health care provider. Document  Released: 03/31/2005 Document Revised: 09/06/2015 Document Reviewed: 12/06/2012 Elsevier Interactive Patient Education  2017 Elsevier Inc.  

## 2017-05-27 ENCOUNTER — Inpatient Hospital Stay: Payer: BLUE CROSS/BLUE SHIELD

## 2017-05-27 VITALS — BP 118/70 | HR 78 | Temp 98.8°F | Resp 18

## 2017-05-27 DIAGNOSIS — C50311 Malignant neoplasm of lower-inner quadrant of right female breast: Secondary | ICD-10-CM | POA: Diagnosis not present

## 2017-05-27 DIAGNOSIS — Z171 Estrogen receptor negative status [ER-]: Secondary | ICD-10-CM | POA: Diagnosis not present

## 2017-05-27 DIAGNOSIS — Z5189 Encounter for other specified aftercare: Secondary | ICD-10-CM | POA: Diagnosis not present

## 2017-05-27 DIAGNOSIS — Z5111 Encounter for antineoplastic chemotherapy: Secondary | ICD-10-CM | POA: Diagnosis not present

## 2017-05-27 MED ORDER — PEGFILGRASTIM INJECTION 6 MG/0.6ML ~~LOC~~
PREFILLED_SYRINGE | SUBCUTANEOUS | Status: AC
  Filled 2017-05-27: qty 0.6

## 2017-05-27 MED ORDER — PEGFILGRASTIM INJECTION 6 MG/0.6ML ~~LOC~~
6.0000 mg | PREFILLED_SYRINGE | Freq: Once | SUBCUTANEOUS | Status: AC
  Administered 2017-05-27: 6 mg via SUBCUTANEOUS

## 2017-05-27 NOTE — Patient Instructions (Signed)
Pegfilgrastim injection What is this medicine? PEGFILGRASTIM (PEG fil gra stim) is a long-acting granulocyte colony-stimulating factor that stimulates the growth of neutrophils, a type of white blood cell important in the body's fight against infection. It is used to reduce the incidence of fever and infection in patients with certain types of cancer who are receiving chemotherapy that affects the bone marrow, and to increase survival after being exposed to high doses of radiation. This medicine may be used for other purposes; ask your health care provider or pharmacist if you have questions. COMMON BRAND NAME(S): Neulasta What should I tell my health care provider before I take this medicine? They need to know if you have any of these conditions: -kidney disease -latex allergy -ongoing radiation therapy -sickle cell disease -skin reactions to acrylic adhesives (On-Body Injector only) -an unusual or allergic reaction to pegfilgrastim, filgrastim, other medicines, foods, dyes, or preservatives -pregnant or trying to get pregnant -breast-feeding How should I use this medicine? This medicine is for injection under the skin. If you get this medicine at home, you will be taught how to prepare and give the pre-filled syringe or how to use the On-body Injector. Refer to the patient Instructions for Use for detailed instructions. Use exactly as directed. Tell your healthcare provider immediately if you suspect that the On-body Injector may not have performed as intended or if you suspect the use of the On-body Injector resulted in a missed or partial dose. It is important that you put your used needles and syringes in a special sharps container. Do not put them in a trash can. If you do not have a sharps container, call your pharmacist or healthcare provider to get one. Talk to your pediatrician regarding the use of this medicine in children. While this drug may be prescribed for selected conditions,  precautions do apply. Overdosage: If you think you have taken too much of this medicine contact a poison control center or emergency room at once. NOTE: This medicine is only for you. Do not share this medicine with others. What if I miss a dose? It is important not to miss your dose. Call your doctor or health care professional if you miss your dose. If you miss a dose due to an On-body Injector failure or leakage, a new dose should be administered as soon as possible using a single prefilled syringe for manual use. What may interact with this medicine? Interactions have not been studied. Give your health care provider a list of all the medicines, herbs, non-prescription drugs, or dietary supplements you use. Also tell them if you smoke, drink alcohol, or use illegal drugs. Some items may interact with your medicine. This list may not describe all possible interactions. Give your health care provider a list of all the medicines, herbs, non-prescription drugs, or dietary supplements you use. Also tell them if you smoke, drink alcohol, or use illegal drugs. Some items may interact with your medicine. What should I watch for while using this medicine? You may need blood work done while you are taking this medicine. If you are going to need a MRI, CT scan, or other procedure, tell your doctor that you are using this medicine (On-Body Injector only). What side effects may I notice from receiving this medicine? Side effects that you should report to your doctor or health care professional as soon as possible: -allergic reactions like skin rash, itching or hives, swelling of the face, lips, or tongue -dizziness -fever -pain, redness, or irritation at site   where injected -pinpoint red spots on the skin -red or dark-brown urine -shortness of breath or breathing problems -stomach or side pain, or pain at the shoulder -swelling -tiredness -trouble passing urine or change in the amount of urine Side  effects that usually do not require medical attention (report to your doctor or health care professional if they continue or are bothersome): -bone pain -muscle pain This list may not describe all possible side effects. Call your doctor for medical advice about side effects. You may report side effects to FDA at 1-800-FDA-1088. Where should I keep my medicine? Keep out of the reach of children. Store pre-filled syringes in a refrigerator between 2 and 8 degrees C (36 and 46 degrees F). Do not freeze. Keep in carton to protect from light. Throw away this medicine if it is left out of the refrigerator for more than 48 hours. Throw away any unused medicine after the expiration date. NOTE: This sheet is a summary. It may not cover all possible information. If you have questions about this medicine, talk to your doctor, pharmacist, or health care provider.  2018 Elsevier/Gold Standard (2016-03-27 12:58:03)  

## 2017-06-01 ENCOUNTER — Ambulatory Visit (HOSPITAL_COMMUNITY)
Admission: RE | Admit: 2017-06-01 | Discharge: 2017-06-01 | Disposition: A | Discharge status: Home / Self Care | Payer: BLUE CROSS/BLUE SHIELD | Source: Ambulatory Visit | Attending: Internal Medicine | Admitting: Internal Medicine

## 2017-06-01 ENCOUNTER — Encounter (HOSPITAL_COMMUNITY): Payer: Self-pay | Admitting: Internal Medicine

## 2017-06-01 ENCOUNTER — Ambulatory Visit (HOSPITAL_BASED_OUTPATIENT_CLINIC_OR_DEPARTMENT_OTHER)
Admission: RE | Admit: 2017-06-01 | Discharge: 2017-06-01 | Disposition: A | Discharge status: Home / Self Care | Payer: BLUE CROSS/BLUE SHIELD | Source: Ambulatory Visit | Attending: Internal Medicine | Admitting: Internal Medicine

## 2017-06-01 VITALS — BP 126/84 | HR 83 | Wt 127.8 lb

## 2017-06-01 DIAGNOSIS — Z171 Estrogen receptor negative status [ER-]: Secondary | ICD-10-CM | POA: Insufficient documentation

## 2017-06-01 DIAGNOSIS — C50311 Malignant neoplasm of lower-inner quadrant of right female breast: Secondary | ICD-10-CM

## 2017-06-01 NOTE — Progress Notes (Signed)
  Echocardiogram 2D Echocardiogram with 3D has been performed.  Darlina Sicilian M 06/01/2017, 11:00 AM

## 2017-06-01 NOTE — Progress Notes (Signed)
CARDIO-ONCOLOGY CLINIC CONSULT NOTE  Referring Physician: Dr. Jana Hakim  HPI: [name] is 52 y.o. female with no past medical history except for right breast cancer referred by Dr.Magrinat for enrollment into the Cardio-Oncology program.  Cancer course:  (1) status post right lumpectomy 03/23/2017 for a pT1b pN0, stage IB invasive ductal carcinoma, grade 3, with negative margins.  (2) adjuvant chemotherapy consisting of cyclophosphamide and doxorubicin in dose dense fashion x4 to start 04/28/2017, followed by weekly paclitaxel x12  (does not qualify for BR003)             (a) echocardiogram 02/25/2017 showed a baseline ejection fraction in the 60-65%  (3) adjuvant radiation to follow   Denis any h/o HTN, HL or known heart disease. Has undergone lumpectomy and 3/4 cycles of AC chemo. Doing well. No edema, CP, palpitations, orthopnea or PND.    Review of Systems: [y] = yes, [ ]  = no   General: Weight gain [ ] ; Weight loss [ ] ; Anorexia [ ] ; Fatigue Blue.Reese ]; Fever [ ] ; Chills [ ] ; Weakness [ ]   Cardiac: Chest pain/pressure [ ] ; Resting SOB [ ] ; Exertional SOB [ ] ; Orthopnea [ ] ; Pedal Edema [ ] ; Palpitations [ ] ; Syncope [ ] ; Presyncope [ ] ; Paroxysmal nocturnal dyspnea[ ]   Pulmonary: Cough [ ] ; Wheezing[ ] ; Hemoptysis[ ] ; Sputum [ ] ; Snoring [ ]   GI: Vomiting[ ] ; Dysphagia[ ] ; Melena[ ] ; Hematochezia [ ] ; Heartburn[ ] ; Abdominal pain [ ] ; Constipation [ ] ; Diarrhea [ ] ; BRBPR [ ]   GU: Hematuria[ ] ; Dysuria [ ] ; Nocturia[ ]   Vascular: Pain in legs with walking [ ] ; Pain in feet with lying flat [ ] ; Non-healing sores [ ] ; Stroke [ ] ; TIA [ ] ; Slurred speech [ ] ;  Neuro: Headaches[ ] ; Vertigo[ ] ; Seizures[ ] ; Paresthesias[ ] ;Blurred vision [ ] ; Diplopia [ ] ; Vision changes [ ]   Ortho/Skin: Arthritis [ ] ; Joint pain [ ] ; Muscle pain [ ] ; Joint swelling [ ] ; Back Pain [ ] ; Rash [ ]   Psych: Depression[ ] ; Anxiety[ ]   Heme: Bleeding problems [ ] ; Clotting disorders [ ] ; Anemia [ ]     Endocrine: Diabetes [ ] ; Thyroid dysfunction[ ]    Past Medical History:  Diagnosis Date  . Cancer [facility])    breast  . Genetic testing 02/19/2017   STAT Breast panel with reflext to Multi-Cancer panel (83 genes) @ [facility] - No pathogenic mutations detected  . GERD (gastroesophageal reflux disease)    no meds  . Seasonal allergies   . SVD (spontaneous vaginal delivery)    x 1    Current Outpatient Medications  Medication Sig Dispense Refill  . dexamethasone (DECADRON) 4 MG tablet Take 2 tablets by mouth once a day on the day after chemotherapy and then take 2 tablets two times a day for 2 days. Take with food. 30 tablet 1  . docusate sodium (STOOL SOFTENER) 100 MG capsule Take 100 mg by mouth 2 (two) times daily.    Marland Kitchen lidocaine-prilocaine (EMLA) cream Apply to affected area once 30 g 3  . loratadine (CLARITIN) 10 MG tablet Take 10 mg by mouth daily as needed for allergies.    Marland Kitchen LORazepam (ATIVAN) 0.5 MG tablet Take 1 tablet (0.5 mg total) by mouth at bedtime as needed (Nausea or vomiting). 30 tablet 0  . Multiple Vitamins-Minerals (MULTIVITAMIN ADULT) CHEW Chew 2 % by mouth.    Marland Kitchen omeprazole (PRILOSEC) 40 MG capsule TAKE 1 CAPSULE BY MOUTH EVERY DAY 30 capsule 1  .  ondansetron (ZOFRAN) 8 MG tablet Take 1 tablet (8 mg total) by mouth every 8 (eight) hours as needed for nausea or vomiting. 20 tablet 0  . prochlorperazine (COMPAZINE) 10 MG tablet Take 1 tablet (10 mg total) by mouth every 6 (six) hours as needed (Nausea or vomiting). 30 tablet 1  . vitamin C (ASCORBIC ACID) 250 MG tablet Take 250 mg by mouth daily.     No current facility-administered medications for this encounter.     Allergies  Allergen Reactions  . Aspirin Hives      Social History   Socioeconomic History  . Marital status: Married    Spouse name: Not on file  . Number of children: Not on file  . Years of education: Not on file  . Highest education level: Not on file  Social Needs  . Financial resource  strain: Not on file  . Food insecurity - worry: Not on file  . Food insecurity - inability: Not on file  . Transportation needs - medical: Not on file  . Transportation needs - non-medical: Not on file  Occupational History  . Not on file  Tobacco Use  . Smoking status: Never Smoker  . Smokeless tobacco: Never Used  Substance and Sexual Activity  . Alcohol use: No  . Drug use: No  . Sexual activity: Yes    Birth control/protection: Surgical  Other Topics Concern  . Not on file  Social History Narrative  . Not on file    FHx: No family h/o premature CAD or HF  Vitals:   06/01/17 1112  BP: 126/84  Pulse: 83  SpO2: 100%  Weight: 127 lb 12 oz (57.9 kg)    PHYSICAL EXAM: General:  Well appearing. No respiratory difficulty HEENT: normal Neck: supple. no JVD. Carotids 2+ bilat; no bruits. No lymphadenopathy or thryomegaly appreciated. Cor: PMI nondisplaced. Regular rate & rhythm. No rubs, gallops or murmurs. + right port-a-cath Lungs: clear Abdomen: soft, nontender, nondistended. No hepatosplenomegaly. No bruits or masses. Good bowel sounds. Extremities: no cyanosis, clubbing, rash, edema Neuro: alert & oriented x 3, cranial nerves grossly intact. moves all 4 extremities w/o difficulty. Affect pleasant.  ECG:   ASSESSMENT & PLAN: 1. Right breast cancer - s/p lumpectomy - now s/p 3/4 cycles of AC chemo - I reviewed echos personally. EF and Doppler parameters stable. No HF on exam. Will see back in 1 month and 6 months for repeat echo to ensure stability. I explained incidence of Adriamycin cardiotoxicity in detail include small possibility of very delayed toxicity.    Glori Bickers, MD  11:20 AM

## 2017-06-01 NOTE — Patient Instructions (Signed)
Your provider requests you have an echocardiogram in 1 month.  Follow up and echo with Dr.Bensimhon in 6 months.

## 2017-06-02 ENCOUNTER — Telehealth: Payer: Self-pay | Admitting: Oncology

## 2017-06-02 NOTE — Telephone Encounter (Signed)
06/02/17 @ 9:03 am spoke with Ronalee Belts @ [facility] regarding [employer] and fax not going through.  He gave an e-mail to fax to of [email or web address].  Left vm for patient informing her of status.

## 2017-06-03 ENCOUNTER — Encounter: Payer: Self-pay | Admitting: Adult Health

## 2017-06-03 ENCOUNTER — Telehealth: Payer: Self-pay | Admitting: Oncology

## 2017-06-03 ENCOUNTER — Inpatient Hospital Stay: Payer: BLUE CROSS/BLUE SHIELD

## 2017-06-03 ENCOUNTER — Inpatient Hospital Stay (HOSPITAL_BASED_OUTPATIENT_CLINIC_OR_DEPARTMENT_OTHER): Payer: BLUE CROSS/BLUE SHIELD | Admitting: Adult Health

## 2017-06-03 VITALS — BP 129/69 | HR 82 | Temp 98.3°F | Resp 20 | Ht 61.0 in | Wt 128.1 lb

## 2017-06-03 DIAGNOSIS — Z171 Estrogen receptor negative status [ER-]: Secondary | ICD-10-CM

## 2017-06-03 DIAGNOSIS — Z5189 Encounter for other specified aftercare: Secondary | ICD-10-CM | POA: Diagnosis not present

## 2017-06-03 DIAGNOSIS — C50311 Malignant neoplasm of lower-inner quadrant of right female breast: Secondary | ICD-10-CM

## 2017-06-03 DIAGNOSIS — Z95828 Presence of other vascular implants and grafts: Secondary | ICD-10-CM | POA: Insufficient documentation

## 2017-06-03 DIAGNOSIS — Q825 Congenital non-neoplastic nevus: Secondary | ICD-10-CM

## 2017-06-03 DIAGNOSIS — Z5111 Encounter for antineoplastic chemotherapy: Secondary | ICD-10-CM | POA: Diagnosis not present

## 2017-06-03 DIAGNOSIS — R5383 Other fatigue: Secondary | ICD-10-CM

## 2017-06-03 LAB — CBC WITH DIFFERENTIAL (CANCER CENTER ONLY)
Basophils Absolute: 0.1 10*3/uL (ref 0.0–0.1)
Basophils Relative: 2 %
EOS PCT: 2 %
Eosinophils Absolute: 0.1 10*3/uL (ref 0.0–0.5)
HCT: 32.2 % — ABNORMAL LOW (ref 34.8–46.6)
Hemoglobin: 11 g/dL — ABNORMAL LOW (ref 11.6–15.9)
LYMPHS ABS: 1.5 10*3/uL (ref 0.9–3.3)
LYMPHS PCT: 42 %
MCH: 30.6 pg (ref 25.1–34.0)
MCHC: 34.2 g/dL (ref 31.5–36.0)
MCV: 89.7 fL (ref 79.5–101.0)
MONO ABS: 0.4 10*3/uL (ref 0.1–0.9)
MONOS PCT: 11 %
Neutro Abs: 1.5 10*3/uL (ref 1.5–6.5)
Neutrophils Relative %: 43 %
PLATELETS: 161 10*3/uL (ref 145–400)
RBC: 3.59 MIL/uL — ABNORMAL LOW (ref 3.70–5.45)
RDW: 13.3 % (ref 11.2–14.5)
WBC Count: 3.4 10*3/uL — ABNORMAL LOW (ref 3.9–10.3)

## 2017-06-03 MED ORDER — SODIUM CHLORIDE 0.9% FLUSH
10.0000 mL | INTRAVENOUS | Status: DC | PRN
  Administered 2017-06-03: 10 mL via INTRAVENOUS
  Filled 2017-06-03: qty 10

## 2017-06-03 NOTE — Progress Notes (Signed)
[place]  [phone] [phone]     ID: UMI MAINOR DOB: [birth year]  MR#: [id number]  BEE#:[id number]  Patient Care Team: Patient, No Pcp Per as PCP - General (General Practice) Magrinat, Virgie Dad, MD as Consulting Physician (Oncology) Kyung Rudd, MD as Consulting Physician (Radiation Oncology) Rolm Bookbinder, MD as Consulting Physician (General Surgery) Meisinger, Sherren Mocha, MD as Consulting Physician (Obstetrics and Gynecology) Loletta Specter, MD (Unknown Physician Specialty) OTHER MD:  CHIEF COMPLAINT: triple negative breast cancer  CURRENT TREATMENT: adjuvant chemotherapy   HISTORY OF CURRENT ILLNESS: From the original intake note:  Maud had routine screening mammography October 2018 showing a possible right breast mass.  She was scheduled for unilateral right mammography and tomography with right breast ultrasonography at the breast center February 06, 2017.  This showed the breast density to be category D.  In the lower inner quadrant of the right breast there was a 0.6 cm oval mass, which by ultrasound measured 0.9 cm and was irregular and hypoechoic.  Ultrasound of the right axilla was sonographically benign.  On February 09, 2017 she underwent biopsy of the right breast mass in question, and this showed ([id number] [phone]) invasive [ductal] carcinoma, grade 3, estrogen and progesterone receptor negative, HER-2/neu not amplified, with a sickness ratio of 1.23 and the number per cell 2.9.  The patient's subsequent history is as detailed below.  INTERVAL HISTORY: [name] returns today for follow-up and treatment of her triple negative breast cancer. She is currently treated adjuvantly with cyclophosphamide and doxorubicin given every 14 days, today being day 8 cycle 3 of 4 planned cycles.  She tolerated this last treatment well.     REVIEW OF SYSTEMS: Yani is doing very well today.  She is taking her anti emetics as prescribed  and isn't having any nausea.  She was previously constipated and had hemorrhoids, however she says those are much improved and she is having regular bowel movements.  She denies any fevers, or chills.  She is fatigued.  She denies chest pain, or shortness of breath.  She saw Dr. Haroldine Laws on 06/01/17, no HF on his exam.  She will return to see him in 1 month and in 6 months for repeat echo and eval.  She denies any issues today other than what is noted above and a detailed ROS is otherwise negative.     PAST MEDICAL HISTORY: Past Medical History:  Diagnosis Date  . Cancer [facility])    breast  . Genetic testing 02/19/2017   STAT Breast panel with reflext to Multi-Cancer panel (83 genes) @ [facility] - No pathogenic mutations detected  . GERD (gastroesophageal reflux disease)    no meds  . Seasonal allergies   . SVD (spontaneous vaginal delivery)    x 1    PAST SURGICAL HISTORY: Past Surgical History:  Procedure Laterality Date  . BILATERAL SALPINGECTOMY Bilateral 06/23/2012   Procedure: BILATERAL SALPINGECTOMY;  Surgeon: Cheri Fowler, MD;  Location: [facility];  Service: Gynecology;  Laterality: Bilateral;  . BREAST LUMPECTOMY WITH RADIOACTIVE SEED AND SENTINEL LYMPH NODE BIOPSY Right 03/23/2017   Procedure: BREAST LUMPECTOMY WITH RADIOACTIVE SEED AND SENTINEL LYMPH NODE BIOPSY;  Surgeon: Rolm Bookbinder, MD;  Location: [facility];  Service: General;  Laterality: Right;  . LAPAROSCOPIC SUPRACERVICAL HYSTERECTOMY N/A 06/23/2012   Procedure: LAPAROSCOPIC SUPRACERVICAL HYSTERECTOMY;  Surgeon: Cheri Fowler, MD;  Location: [facility];  Service: Gynecology;  Laterality: N/A;  . PORTACATH PLACEMENT Right 03/23/2017   Procedure: INSERTION PORT-A-CATH;  Surgeon: Donne Hazel,  Rodman Key, MD;  Location: [facility];  Service: General;  Laterality: Right;  . TUBAL LIGATION    . VULVAR LESION REMOVAL N/A 11/01/2014   Procedure: MONS PUBIS LESION;  Surgeon: Cheri Fowler, MD;  Location: [facility];  Service: Gynecology;   Laterality: N/A;  local anesthesia with IV sedation if needed  . WISDOM TOOTH EXTRACTION      FAMILY HISTORY No family history on file.The patient has no information regarding her father or his side of the family.  The patient's mother is 50 years old as of November 2018.  The patient has 3 brothers, 2 sisters.  One sister had 2 "benign tumors" removed from the right breast at the age of 77.  A maternal grandfather had prostate cancer in his 33s.  GYNECOLOGIC HISTORY:  Patient's last menstrual period was 05/26/2012. Menarche age 61, first live birth age 12, the patient is GX P1.  She stopped having periods 2012 when she underwent hysterectomy, without salpingo-oophorectomy..  She used hormone replacement for approximately 3 months.  She is used oral contraceptives remotely with no complications.  SOCIAL HISTORY:  Clovia works as an Psychologist, educational for [employer], in the paternal DNA subsection.  Her husband Lennette Bihari is an Clinical biochemist.  There is some [name] is an Producer, television/film/video and lives in [place].  The patient has no grandchildren.  She is a Psychologist, forensic.    ADVANCED DIRECTIVES: Not in place   HEALTH MAINTENANCE: Social History   Tobacco Use  . Smoking status: Never Smoker  . Smokeless tobacco: Never Used  Substance Use Topics  . Alcohol use: No  . Drug use: No     Colonoscopy: n/a  PAP: Status post hysterectomy  Bone density: n/a   Allergies  Allergen Reactions  . Aspirin Hives    Current Outpatient Medications  Medication Sig Dispense Refill  . dexamethasone (DECADRON) 4 MG tablet Take 2 tablets by mouth once a day on the day after chemotherapy and then take 2 tablets two times a day for 2 days. Take with food. 30 tablet 1  . docusate sodium (STOOL SOFTENER) 100 MG capsule Take 100 mg by mouth 2 (two) times daily.    Marland Kitchen lidocaine-prilocaine (EMLA) cream Apply to affected area once 30 g 3  . loratadine (CLARITIN) 10 MG tablet Take 10 mg by mouth daily as needed for allergies.    Marland Kitchen  LORazepam (ATIVAN) 0.5 MG tablet Take 1 tablet (0.5 mg total) by mouth at bedtime as needed (Nausea or vomiting). 30 tablet 0  . Multiple Vitamins-Minerals (MULTIVITAMIN ADULT) CHEW Chew 2 % by mouth.    Marland Kitchen omeprazole (PRILOSEC) 40 MG capsule TAKE 1 CAPSULE BY MOUTH EVERY DAY 30 capsule 1  . ondansetron (ZOFRAN) 8 MG tablet Take 1 tablet (8 mg total) by mouth every 8 (eight) hours as needed for nausea or vomiting. 20 tablet 0  . prochlorperazine (COMPAZINE) 10 MG tablet Take 1 tablet (10 mg total) by mouth every 6 (six) hours as needed (Nausea or vomiting). 30 tablet 1  . vitamin C (ASCORBIC ACID) 250 MG tablet Take 250 mg by mouth daily.     No current facility-administered medications for this visit.     OBJECTIVE:  Vitals:   06/03/17 0854  BP: 129/69  Pulse: 82  Resp: 20  Temp: 98.3 F (36.8 C)  SpO2: 100%     Body mass index is 24.2 kg/m.   Wt Readings from Last 3 Encounters:  06/03/17 128 lb 1.6 oz (58.1 kg)  06/01/17 127  lb 12 oz (57.9 kg)  05/19/17 126 lb 14.4 oz (57.6 kg)      ECOG FS:1 - Symptomatic but completely ambulatory GENERAL: Patient is a well appearing female in no acute distress HEENT:  Sclerae anicteric.  Oropharynx clear and moist. No ulcerations or evidence of oropharyngeal candidiasis. Neck is supple.  NODES:  No cervical, supraclavicular, or axillary lymphadenopathy palpated.  BREAST EXAM:  Deferred. LUNGS:  Clear to auscultation bilaterally.  No wheezes or rhonchi. HEART:  Regular rate and rhythm. No murmur appreciated. ABDOMEN:  Soft, nontender.  Positive, normoactive bowel sounds. No organomegaly palpated. MSK:  No focal spinal tenderness to palpation. Full range of motion bilaterally in the upper extremities. EXTREMITIES:  No peripheral edema.   SKIN:  Clear with no obvious rashes or skin changes. No nail dyscrasia. NEURO:  Nonfocal. Well oriented.  Appropriate affect.    LAB RESULTS:  CMP     Component Value Date/Time   NA 139 05/26/2017 0734    NA 142 02/18/2017 1221   K 3.7 05/26/2017 0734   K 4.6 02/18/2017 1221   CL 102 05/26/2017 0734   CO2 25 05/26/2017 0734   CO2 27 02/18/2017 1221   GLUCOSE 127 05/26/2017 0734   GLUCOSE 95 02/18/2017 1221   BUN 21 05/26/2017 0734   BUN 18.4 02/18/2017 1221   CREATININE 0.90 05/26/2017 0734   CREATININE 1.0 02/18/2017 1221   CALCIUM 9.8 05/26/2017 0734   CALCIUM 10.3 02/18/2017 1221   PROT 7.4 05/26/2017 0734   PROT 8.3 02/18/2017 1221   ALBUMIN 4.1 05/26/2017 0734   ALBUMIN 4.4 02/18/2017 1221   AST 19 05/26/2017 0734   AST 22 02/18/2017 1221   ALT 49 05/26/2017 0734   ALT 18 02/18/2017 1221   ALKPHOS 160 (H) 05/26/2017 0734   ALKPHOS 89 02/18/2017 1221   BILITOT 0.3 05/26/2017 0734   BILITOT 0.48 02/18/2017 1221   GFRNONAA >60 05/26/2017 0734   GFRAA >60 05/26/2017 0734    No results found for: TOTALPROTELP, ALBUMINELP, A1GS, A2GS, BETS, BETA2SER, GAMS, MSPIKE, SPEI  No results found for: Nils Pyle, [facility]  Lab Results  Component Value Date   WBC 24.8 (H) 05/26/2017   NEUTROABS 21.1 (H) 05/26/2017   HGB 13.7 03/23/2017   HCT 37.4 05/26/2017   MCV 89.2 05/26/2017   PLT 246 05/26/2017      Chemistry      Component Value Date/Time   NA 139 05/26/2017 0734   NA 142 02/18/2017 1221   K 3.7 05/26/2017 0734   K 4.6 02/18/2017 1221   CL 102 05/26/2017 0734   CO2 25 05/26/2017 0734   CO2 27 02/18/2017 1221   BUN 21 05/26/2017 0734   BUN 18.4 02/18/2017 1221   CREATININE 0.90 05/26/2017 0734   CREATININE 1.0 02/18/2017 1221      Component Value Date/Time   CALCIUM 9.8 05/26/2017 0734   CALCIUM 10.3 02/18/2017 1221   ALKPHOS 160 (H) 05/26/2017 0734   ALKPHOS 89 02/18/2017 1221   AST 19 05/26/2017 0734   AST 22 02/18/2017 1221   ALT 49 05/26/2017 0734   ALT 18 02/18/2017 1221   BILITOT 0.3 05/26/2017 0734   BILITOT 0.48 02/18/2017 1221       No results found for: LABCA2  No components found for: DZHGDJ242  No results for  input(s): INR in the last 168 hours.  No results found for: LABCA2  No results found for: AST419  No results found for: QQI297  No results found  for: QXI503  No results found for: CA2729  No components found for: HGQUANT  No results found for: CEA1 / No results found for: CEA1   No results found for: AFPTUMOR  No results found for: CHROMOGRNA  No results found for: PSA1  No visits with results within 3 Day(s) from this visit.  Latest known visit with results is:  Appointment on 05/26/2017  Component Date Value Ref Range Status  . WBC Count 05/26/2017 24.8* 3.9 - 10.3 K/uL Final  . RBC 05/26/2017 4.19  3.70 - 5.45 MIL/uL Final  . Hemoglobin 05/26/2017 12.6  11.6 - 15.9 g/dL Final  . HCT 05/26/2017 37.4  34.8 - 46.6 % Final  . MCV 05/26/2017 89.2  79.5 - 101.0 fL Final  . MCH 05/26/2017 30.0  25.1 - 34.0 pg Final  . MCHC 05/26/2017 33.7  31.5 - 36.0 g/dL Final  . RDW 05/26/2017 13.0  11.2 - 14.5 % Final  . Platelet Count 05/26/2017 246  145 - 400 K/uL Final  . Neutrophils Relative % 05/26/2017 85  % Final  . Neutro Abs 05/26/2017 21.1* 1.5 - 6.5 K/uL Final  . Lymphocytes Relative 05/26/2017 11  % Final  . Lymphs Abs 05/26/2017 2.7  0.9 - 3.3 K/uL Final  . Monocytes Relative 05/26/2017 2  % Final  . Monocytes Absolute 05/26/2017 0.6  0.1 - 0.9 K/uL Final  . Eosinophils Relative 05/26/2017 0  % Final  . Eosinophils Absolute 05/26/2017 0.0  0.0 - 0.5 K/uL Final  . Basophils Relative 05/26/2017 2  % Final  . Basophils Absolute 05/26/2017 0.4* 0.0 - 0.1 K/uL Final   Performed at [facility], [place] [address], [place], [place] [address]  . Sodium 05/26/2017 139  136 - 145 mmol/L Final  . Potassium 05/26/2017 3.7  3.5 - 5.1 mmol/L Final  . Chloride 05/26/2017 102  98 - 109 mmol/L Final  . CO2 05/26/2017 25  22 - 29 mmol/L Final  . Glucose, Bld 05/26/2017 127  70 - 140 mg/dL Final  . BUN 05/26/2017 21  7 - 26 mg/dL Final  . Creatinine 05/26/2017 0.90   0.60 - 1.10 mg/dL Final  . Calcium 05/26/2017 9.8  8.4 - 10.4 mg/dL Final  . Total Protein 05/26/2017 7.4  6.4 - 8.3 g/dL Final  . Albumin 05/26/2017 4.1  3.5 - 5.0 g/dL Final  . AST 05/26/2017 19  5 - 34 U/L Final  . ALT 05/26/2017 49  0 - 55 U/L Final  . Alkaline Phosphatase 05/26/2017 160* 40 - 150 U/L Final  . Total Bilirubin 05/26/2017 0.3  0.2 - 1.2 mg/dL Final  . GFR, Est Non Af Am 05/26/2017 >60  >60 mL/min Final  . GFR, Est AFR Am 05/26/2017 >60  >60 mL/min Final   Comment: (NOTE) The eGFR has been calculated using the CKD EPI equation. This calculation has not been validated in all clinical situations. eGFR's persistently <60 mL/min signify possible Chronic Kidney Disease.   Georgiann Hahn gap 05/26/2017 12* 3 - 11 Final   Performed at [facility], [place] [address], [place],  [address]    (this displays the last labs from the last 3 days)  No results found for: TOTALPROTELP, ALBUMINELP, A1GS, A2GS, BETS, BETA2SER, GAMS, MSPIKE, SPEI (this displays SPEP labs)  No results found for: KPAFRELGTCHN, LAMBDASER, KAPLAMBRATIO (kappa/lambda light chains)  No results found for: HGBA, HGBA2QUANT, HGBFQUANT, HGBSQUAN (Hemoglobinopathy evaluation)   No results found for: LDH  No results  found for: IRON, TIBC, IRONPCTSAT (Iron and TIBC)  No results found for: FERRITIN  Urinalysis No results found for: COLORURINE, APPEARANCEUR, LABSPEC, PHURINE, GLUCOSEU, HGBUR, BILIRUBINUR, KETONESUR, PROTEINUR, UROBILINOGEN, NITRITE, LEUKOCYTESUR   STUDIES: No results found.  ELIGIBLE FOR AVAILABLE RESEARCH PROTOCOL: UPBEAT  ASSESSMENT: 52 y.o. [place] woman status post right breast lower inner quadrant biopsy February 09, 2017 for a clinical T1b N0, stage IB invasive ductal carcinoma, grade 3, triple negative  (1) status post right lumpectomy 03/23/2017 for a pT1b pN0, stage IB invasive ductal carcinoma, grade 3, with negative margins.  (2) adjuvant  chemotherapy consisting of cyclophosphamide and doxorubicin in dose dense fashion x4 to start 04/28/2017, followed by weekly paclitaxel x12  (does not qualify for BR003)  (a) echocardiogram 02/25/2017 showed a baseline ejection fraction in the 60-65%  (3) adjuvant radiation to follow  (4) genetics testing 02/19/2017 through the STAT Breast panel with reflext to Multi-Cancer panel (83 genes) @ [facility] - No pathogenic mutations detected in ALK, APC, ATM, AXIN2, BAP1, BARD1, BLM, BMPR1A, BRCA1, BRCA2, BRIP1, CASR, CDC73, CDH1, CDK4, CDKN1B, CDKN1C, CDKN2A, CEBPA, CHEK2, CTNNA1, DICER1, DIS3L2, EGFR, EPCAM, FH, FLCN, GATA2, GPC3, GREM1, HOXB13, HRAS, KIT, MAX, MEN1, MET, MITF, MLH1, MSH2, MSH3, MSH6, MUTYH, NBN, NF1, NF2, NTHL1, PALB2, PDGFRA, PHOX2B, PMS2, POLD1, POLE, POT1, PRKAR1A, PTCH1, PTEN, RAD50, RAD51C, RAD51D, RB1, RECQL4, RET, RUNX1, SDHA, SDHAF2, SDHB, SDHC, SDHD, SMAD4, SMARCA4, SMARCB1, SMARCE1, STK11, SUFU, TERC, TERT, TMEM127, TP53, TSC1, TSC2, VHL, WRN, WT1.  (a) Variants of Uncertain Significance in PDGFRA c.3040G>A (p.Ala1014Thr) and SMARCA4 c.2044C>G (p.Leu682Val) noted  PLAN: Kelsey Henry is doing very well today.  I reviewed her CBC with her today.  She is not neutropenic.  She says that she feels good, so she thought her numbers would be good.  She will continue on her bowel regimen, as she is no longer having constipation issues.  She will return in one week for labs, and cycle 4 of Doxorubicin and Cyclophosphamide.  She will see Dr. Jana Hakim on cycle four day 8.   I went ahead and requested scheduling of her first couple of Paclitaxel treatments.  She knows to call for any questions or concerns prior to her next appointment with [place].    A total of (20) minutes of face-to-face time was spent with this patient with greater than 50% of that time in counseling and care-coordination.   Wilber Bihari NP  06/03/17 8:56 AM Medical Oncology and Hematology [facility] [address] [place], [place] [address] Tel. [phone]    Fax. [phone]

## 2017-06-03 NOTE — Telephone Encounter (Signed)
Gave avs and calendar for march °

## 2017-06-09 ENCOUNTER — Other Ambulatory Visit: Payer: Self-pay | Admitting: *Deleted

## 2017-06-09 ENCOUNTER — Inpatient Hospital Stay: Payer: BLUE CROSS/BLUE SHIELD

## 2017-06-09 ENCOUNTER — Other Ambulatory Visit: Payer: Self-pay | Admitting: Oncology

## 2017-06-09 DIAGNOSIS — C50311 Malignant neoplasm of lower-inner quadrant of right female breast: Secondary | ICD-10-CM | POA: Diagnosis not present

## 2017-06-09 DIAGNOSIS — Z171 Estrogen receptor negative status [ER-]: Principal | ICD-10-CM

## 2017-06-09 DIAGNOSIS — Z95828 Presence of other vascular implants and grafts: Secondary | ICD-10-CM

## 2017-06-09 DIAGNOSIS — Z5111 Encounter for antineoplastic chemotherapy: Secondary | ICD-10-CM | POA: Diagnosis not present

## 2017-06-09 DIAGNOSIS — Z5189 Encounter for other specified aftercare: Secondary | ICD-10-CM | POA: Diagnosis not present

## 2017-06-09 LAB — CMP (CANCER CENTER ONLY)
ALBUMIN: 3.9 g/dL (ref 3.5–5.0)
ALT: 70 U/L — ABNORMAL HIGH (ref 0–55)
ANION GAP: 11 (ref 3–11)
AST: 21 U/L (ref 5–34)
Alkaline Phosphatase: 156 U/L — ABNORMAL HIGH (ref 40–150)
BUN: 19 mg/dL (ref 7–26)
CHLORIDE: 103 mmol/L (ref 98–109)
CO2: 26 mmol/L (ref 22–29)
Calcium: 10 mg/dL (ref 8.4–10.4)
Creatinine: 0.78 mg/dL (ref 0.60–1.10)
GFR, Est AFR Am: >60 mL/min (ref >60)
GFR, Estimated: >60 mL/min (ref >60)
GLUCOSE: 73 mg/dL (ref 70–140)
POTASSIUM: 4 mmol/L (ref 3.5–5.1)
Sodium: 140 mmol/L (ref 136–145)
Total Bilirubin: <0.2 mg/dL — ABNORMAL LOW (ref 0.2–1.2)
Total Protein: 6.9 g/dL (ref 6.4–8.3)

## 2017-06-09 LAB — CBC WITH DIFFERENTIAL (CANCER CENTER ONLY)
BASOS ABS: 0.2 10*3/uL — AB (ref 0.0–0.1)
Basophils Relative: 1 %
Eosinophils Absolute: 0 10*3/uL (ref 0.0–0.5)
Eosinophils Relative: 0 %
HEMATOCRIT: 35.5 % (ref 34.8–46.6)
HEMOGLOBIN: 11.7 g/dL (ref 11.6–15.9)
LYMPHS ABS: 2.8 10*3/uL (ref 0.9–3.3)
LYMPHS PCT: 11 %
MCH: 30.7 pg (ref 25.1–34.0)
MCHC: 33 g/dL (ref 31.5–36.0)
MCV: 93.2 fL (ref 79.5–101.0)
MONO ABS: 1 10*3/uL — AB (ref 0.1–0.9)
MONOS PCT: 4 %
NEUTROS ABS: 22.4 10*3/uL — AB (ref 1.5–6.5)
Neutrophils Relative %: 84 %
Platelet Count: 201 10*3/uL (ref 145–400)
RBC: 3.81 MIL/uL (ref 3.70–5.45)
RDW: 15.5 % — ABNORMAL HIGH (ref 11.2–14.5)
WBC Count: 26.4 10*3/uL — ABNORMAL HIGH (ref 3.9–10.3)
nRBC: 2 /100 WBC — ABNORMAL HIGH

## 2017-06-09 MED ORDER — PALONOSETRON HCL INJECTION 0.25 MG/5ML
INTRAVENOUS | Status: AC
  Filled 2017-06-09: qty 5

## 2017-06-09 MED ORDER — SODIUM CHLORIDE 0.9% FLUSH
10.0000 mL | INTRAVENOUS | Status: DC | PRN
  Administered 2017-06-09: 10 mL via INTRAVENOUS
  Filled 2017-06-09: qty 10

## 2017-06-09 MED ORDER — DOXORUBICIN HCL CHEMO IV INJECTION 2 MG/ML
60.0000 mg/m2 | Freq: Once | INTRAVENOUS | Status: AC
  Administered 2017-06-09: 94 mg via INTRAVENOUS
  Filled 2017-06-09: qty 47

## 2017-06-09 MED ORDER — HEPARIN SOD (PORK) LOCK FLUSH 100 UNIT/ML IV SOLN
500.0000 [IU] | Freq: Once | INTRAVENOUS | Status: AC | PRN
  Administered 2017-06-09: 500 [IU]
  Filled 2017-06-09: qty 5

## 2017-06-09 MED ORDER — SODIUM CHLORIDE 0.9 % IV SOLN
Freq: Once | INTRAVENOUS | Status: AC
  Administered 2017-06-09: 11:00:00 via INTRAVENOUS

## 2017-06-09 MED ORDER — SODIUM CHLORIDE 0.9% FLUSH
10.0000 mL | INTRAVENOUS | Status: DC | PRN
  Administered 2017-06-09: 10 mL
  Filled 2017-06-09: qty 10

## 2017-06-09 MED ORDER — LORAZEPAM 0.5 MG PO TABS: 0.5000 mg | ORAL_TABLET | Freq: Every evening | ORAL | 0 refills | Status: DC | PRN

## 2017-06-09 MED ORDER — SODIUM CHLORIDE 0.9 % IV SOLN
Freq: Once | INTRAVENOUS | Status: AC
  Administered 2017-06-09: 11:00:00 via INTRAVENOUS
  Filled 2017-06-09: qty 5

## 2017-06-09 MED ORDER — PALONOSETRON HCL INJECTION 0.25 MG/5ML
0.2500 mg | Freq: Once | INTRAVENOUS | Status: AC
  Administered 2017-06-09: 0.25 mg via INTRAVENOUS

## 2017-06-09 MED ORDER — SODIUM CHLORIDE 0.9 % IV SOLN
600.0000 mg/m2 | Freq: Once | INTRAVENOUS | Status: AC
  Administered 2017-06-09: 940 mg via INTRAVENOUS
  Filled 2017-06-09: qty 47

## 2017-06-09 NOTE — Patient Instructions (Signed)
[facility] Discharge Instructions for Patients Receiving Chemotherapy  Today you received the following chemotherapy agents Adriamycin and Cytoxan  To help prevent nausea and vomiting after your treatment, we encourage you to take your nausea medication as directed.  If you develop nausea and vomiting that is not controlled by your nausea medication, call the clinic.   BELOW ARE SYMPTOMS THAT SHOULD BE REPORTED IMMEDIATELY:  *FEVER GREATER THAN 100.5 F  *CHILLS WITH OR WITHOUT FEVER  NAUSEA AND VOMITING THAT IS NOT CONTROLLED WITH YOUR NAUSEA MEDICATION  *UNUSUAL SHORTNESS OF BREATH  *UNUSUAL BRUISING OR BLEEDING  TENDERNESS IN MOUTH AND THROAT WITH OR WITHOUT PRESENCE OF ULCERS  *URINARY PROBLEMS  *BOWEL PROBLEMS  UNUSUAL RASH Items with * indicate a potential emergency and should be followed up as soon as possible.  Feel free to call the clinic should you have any questions or concerns. The clinic phone number is [phone].  Please show the CHEMO ALERT CARD at check-in to the Emergency Department and triage nurse.   

## 2017-06-10 ENCOUNTER — Inpatient Hospital Stay: Payer: BLUE CROSS/BLUE SHIELD

## 2017-06-10 DIAGNOSIS — C50311 Malignant neoplasm of lower-inner quadrant of right female breast: Secondary | ICD-10-CM

## 2017-06-10 DIAGNOSIS — Z5111 Encounter for antineoplastic chemotherapy: Secondary | ICD-10-CM | POA: Diagnosis not present

## 2017-06-10 DIAGNOSIS — Z171 Estrogen receptor negative status [ER-]: Secondary | ICD-10-CM | POA: Diagnosis not present

## 2017-06-10 DIAGNOSIS — Z5189 Encounter for other specified aftercare: Secondary | ICD-10-CM | POA: Diagnosis not present

## 2017-06-10 MED ORDER — PEGFILGRASTIM INJECTION 6 MG/0.6ML ~~LOC~~
6.0000 mg | PREFILLED_SYRINGE | Freq: Once | SUBCUTANEOUS | Status: AC
  Administered 2017-06-10: 6 mg via SUBCUTANEOUS

## 2017-06-10 MED ORDER — PEGFILGRASTIM INJECTION 6 MG/0.6ML ~~LOC~~
PREFILLED_SYRINGE | SUBCUTANEOUS | Status: AC
  Filled 2017-06-10: qty 0.6

## 2017-06-10 NOTE — Patient Instructions (Signed)
Pegfilgrastim injection What is this medicine? PEGFILGRASTIM (PEG fil gra stim) is a long-acting granulocyte colony-stimulating factor that stimulates the growth of neutrophils, a type of white blood cell important in the body's fight against infection. It is used to reduce the incidence of fever and infection in patients with certain types of cancer who are receiving chemotherapy that affects the bone marrow, and to increase survival after being exposed to high doses of radiation. This medicine may be used for other purposes; ask your health care provider or pharmacist if you have questions. COMMON BRAND NAME(S): Neulasta What should I tell my health care provider before I take this medicine? They need to know if you have any of these conditions: -kidney disease -latex allergy -ongoing radiation therapy -sickle cell disease -skin reactions to acrylic adhesives (On-Body Injector only) -an unusual or allergic reaction to pegfilgrastim, filgrastim, other medicines, foods, dyes, or preservatives -pregnant or trying to get pregnant -breast-feeding How should I use this medicine? This medicine is for injection under the skin. If you get this medicine at home, you will be taught how to prepare and give the pre-filled syringe or how to use the On-body Injector. Refer to the patient Instructions for Use for detailed instructions. Use exactly as directed. Tell your healthcare provider immediately if you suspect that the On-body Injector may not have performed as intended or if you suspect the use of the On-body Injector resulted in a missed or partial dose. It is important that you put your used needles and syringes in a special sharps container. Do not put them in a trash can. If you do not have a sharps container, call your pharmacist or healthcare provider to get one. Talk to your pediatrician regarding the use of this medicine in children. While this drug may be prescribed for selected conditions,  precautions do apply. Overdosage: If you think you have taken too much of this medicine contact a poison control center or emergency room at once. NOTE: This medicine is only for you. Do not share this medicine with others. What if I miss a dose? It is important not to miss your dose. Call your doctor or health care professional if you miss your dose. If you miss a dose due to an On-body Injector failure or leakage, a new dose should be administered as soon as possible using a single prefilled syringe for manual use. What may interact with this medicine? Interactions have not been studied. Give your health care provider a list of all the medicines, herbs, non-prescription drugs, or dietary supplements you use. Also tell them if you smoke, drink alcohol, or use illegal drugs. Some items may interact with your medicine. This list may not describe all possible interactions. Give your health care provider a list of all the medicines, herbs, non-prescription drugs, or dietary supplements you use. Also tell them if you smoke, drink alcohol, or use illegal drugs. Some items may interact with your medicine. What should I watch for while using this medicine? You may need blood work done while you are taking this medicine. If you are going to need a MRI, CT scan, or other procedure, tell your doctor that you are using this medicine (On-Body Injector only). What side effects may I notice from receiving this medicine? Side effects that you should report to your doctor or health care professional as soon as possible: -allergic reactions like skin rash, itching or hives, swelling of the face, lips, or tongue -dizziness -fever -pain, redness, or irritation at site   where injected -pinpoint red spots on the skin -red or dark-brown urine -shortness of breath or breathing problems -stomach or side pain, or pain at the shoulder -swelling -tiredness -trouble passing urine or change in the amount of urine Side  effects that usually do not require medical attention (report to your doctor or health care professional if they continue or are bothersome): -bone pain -muscle pain This list may not describe all possible side effects. Call your doctor for medical advice about side effects. You may report side effects to FDA at 1-800-FDA-1088. Where should I keep my medicine? Keep out of the reach of children. Store pre-filled syringes in a refrigerator between 2 and 8 degrees C (36 and 46 degrees F). Do not freeze. Keep in carton to protect from light. Throw away this medicine if it is left out of the refrigerator for more than 48 hours. Throw away any unused medicine after the expiration date. NOTE: This sheet is a summary. It may not cover all possible information. If you have questions about this medicine, talk to your doctor, pharmacist, or health care provider.  2018 Elsevier/Gold Standard (2016-03-27 12:58:03)  

## 2017-06-11 ENCOUNTER — Other Ambulatory Visit: Payer: Self-pay | Admitting: *Deleted

## 2017-06-15 NOTE — Telephone Encounter (Signed)
Returned pt's call regarding she received call regarding refill request.  Pt reports she declined the medicine Venlafaxine for hot flashes and she had it took off her med list because she is coping with hot flashes on her own.  Sending [facility] note regarding automated refill  that pt does not need that refill, pt made aware and no other needs at this time.

## 2017-06-15 NOTE — Progress Notes (Signed)
[place]  [phone] Fax:([phone]) [phone]     ID: Kelsey Henry DOB: January 21, 1966  MR#: [id number]  TTS#:[id number]  Patient Care Team: Patient, No Pcp Per as PCP - General (General Practice) Magrinat, Virgie Dad, MD as Consulting Physician (Oncology) Kyung Rudd, MD as Consulting Physician (Radiation Oncology) Rolm Bookbinder, MD as Consulting Physician (General Surgery) Meisinger, Sherren Mocha, MD as Consulting Physician (Obstetrics and Gynecology) Loletta Specter, MD (Unknown Physician Specialty) OTHER MD:  CHIEF COMPLAINT: triple negative breast cancer  CURRENT TREATMENT: adjuvant chemotherapy   HISTORY OF CURRENT ILLNESS: From the original intake note:  Kelsey Henry had routine screening mammography October 2018 showing a possible right breast mass.  She was scheduled for unilateral right mammography and tomography with right breast ultrasonography at the breast center February 06, 2017.  This showed the breast density to be category D.  In the lower inner quadrant of the right breast there was a 0.6 cm oval mass, which by ultrasound measured 0.9 cm and was irregular and hypoechoic.  Ultrasound of the right axilla was sonographically benign.  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  The patient's subsequent history is as detailed below.  INTERVAL HISTORY: Kelsey Henry returns today for follow-up and treatment of her triple negative breast cancer. She is currently treated adjuvantly with cyclophosphamide and doxorubicin given every 14 days, today being day 8 cycle 4 of 4 planned cycles. She reports that she tolerated the cycles well.  She is now ready to start her weekly paclitaxel treatments.   REVIEW OF SYSTEMS: Kelsey Henry reports  that for exercise, she is working out using minimal weights for weight lifting as well as squats. She also notes that she is still able to take walks without difficulties. She denies a lot of bony aches with the neulasta injections. She reports that she shaved her head to keep from losing her hair and going through those emotions. She took off work until she is completed with chemotherapy. She reports mild intermittent tingling sensation to her bilateral toes. She denies unusual headaches, visual changes, nausea, vomiting, or dizziness. There has been no unusual cough, phlegm production, or pleurisy. This been no change in bowel or bladder habits. She denies unexplained fatigue or unexplained weight loss, bleeding, rash, or fever. A detailed review of systems was otherwise stable.      PAST MEDICAL HISTORY: Past Medical History:  Diagnosis Date  . Cancer [facility])    breast  . Genetic testing 02/19/2017   STAT Breast panel with reflext to Multi-Cancer panel (83 genes) @ [facility] - No pathogenic mutations detected  . GERD (gastroesophageal reflux disease)    no meds  . Seasonal allergies   . SVD (spontaneous vaginal delivery)    x 1    PAST SURGICAL HISTORY: Past Surgical History:  Procedure Laterality Date  . BILATERAL SALPINGECTOMY Bilateral 06/23/2012   Procedure: BILATERAL SALPINGECTOMY;  Surgeon: Cheri Fowler, MD;  Location: [name];  Service: Gynecology;  Laterality: Bilateral;  . BREAST LUMPECTOMY WITH RADIOACTIVE SEED AND SENTINEL LYMPH NODE BIOPSY Right 03/23/2017   Procedure: BREAST LUMPECTOMY WITH RADIOACTIVE SEED AND SENTINEL LYMPH NODE BIOPSY;  Surgeon: Rolm Bookbinder, MD;  Location: [place];  Service: General;  Laterality: Right;  . LAPAROSCOPIC SUPRACERVICAL HYSTERECTOMY N/A 06/23/2012   Procedure: LAPAROSCOPIC SUPRACERVICAL HYSTERECTOMY;  Surgeon: Cheri Fowler, MD;  Location: [facility];  Service: Gynecology;  Laterality: N/A;  . PORTACATH PLACEMENT Right 03/23/2017   Procedure:  INSERTION PORT-A-CATH;  Surgeon: Rolm Bookbinder, MD;  Location: [place];  Service: General;  Laterality: Right;  . TUBAL LIGATION    . VULVAR LESION REMOVAL N/A 11/01/2014   Procedure: MONS PUBIS LESION;  Surgeon: Cheri Fowler, MD;  Location: [facility];  Service: Gynecology;  Laterality: N/A;  local anesthesia with IV sedation if needed  . WISDOM TOOTH EXTRACTION      FAMILY HISTORY No family history on file.The patient has no information regarding her father or his side of the family.  The patient's mother is 43 years old as of November 2018.  The patient has 3 brothers, 2 sisters.  One sister had 2 "benign tumors" removed from the right breast at the age of 93.  A maternal grandfather had prostate cancer in his 72s.  GYNECOLOGIC HISTORY:  Patient's last menstrual period was 05/26/2012. Menarche age 35, first live birth age 73, the patient is GX P1.  She stopped having periods 2012 when she underwent hysterectomy, without salpingo-oophorectomy..  She used hormone replacement for approximately 3 months.  She is used oral contraceptives remotely with no complications.  SOCIAL HISTORY:  Kelsey Henry works as an Psychologist, educational for [employer], in the paternal DNA subsection.  Her husband Kelsey Henry is an Clinical biochemist.  Their son Kelsey Henry is an Producer, television/film/video and lives in [place].  The patient has no grandchildren.  She is a Psychologist, forensic.    ADVANCED DIRECTIVES: Not in place   HEALTH MAINTENANCE: Social History   Tobacco Use  . Smoking status: Never Smoker  . Smokeless tobacco: Never Used  Substance Use Topics  . Alcohol use: No  . Drug use: No     Colonoscopy: n/a  PAP: Status post hysterectomy  Bone density: n/a   Allergies  Allergen Reactions  . Aspirin Hives    Current Outpatient Medications  Medication Sig Dispense Refill  . dexamethasone (DECADRON) 4 MG tablet Take 2 tablets by mouth once a day on the day after chemotherapy and then take 2 tablets two times a day for 2 days. Take with food. 30  tablet 1  . docusate sodium (STOOL SOFTENER) 100 MG capsule Take 100 mg by mouth 2 (two) times daily.    Marland Kitchen lidocaine-prilocaine (EMLA) cream Apply to affected area once 30 g 3  . loratadine (CLARITIN) 10 MG tablet Take 10 mg by mouth daily as needed for allergies.    Marland Kitchen LORazepam (ATIVAN) 0.5 MG tablet TAKE 1 TABLET BY MOUTH AT BEDTIME AS NEEDED FOR NAUSEA AND VOMITING 30 tablet 0  . Multiple Vitamins-Minerals (MULTIVITAMIN ADULT) CHEW Chew 2 % by mouth.    Marland Kitchen omeprazole (PRILOSEC) 40 MG capsule TAKE 1 CAPSULE BY MOUTH EVERY DAY 30 capsule 1  . ondansetron (ZOFRAN) 8 MG tablet Take 1 tablet (8 mg total) by mouth every 8 (eight) hours as needed for nausea or vomiting. 20 tablet 0  . prochlorperazine (COMPAZINE) 10 MG tablet Take 1 tablet (10 mg total) by mouth every 6 (six) hours as needed (Nausea or vomiting). 30 tablet 1  . vitamin C (ASCORBIC ACID) 250 MG tablet Take 250 mg by mouth daily.     No current facility-administered medications for this visit.     OBJECTIVE: Middle-aged African-American woman who appears well  Vitals:   06/16/17 0913  BP: 121/74  Pulse: 91  Resp: 18  Temp: 98.4 F (36.9 C)  SpO2: 100%     Body mass index is 24.36 kg/m.   Wt Readings from Last 3 Encounters:  06/16/17 128 lb 14.4 oz (58.5 kg)  06/03/17 128 lb 1.6 oz (58.1 kg)  06/01/17 127 lb 12 oz (57.9 kg)      ECOG FS:0 - Asymptomatic  Sclerae unicteric, EOMs intact Oropharynx clear and moist No cervical or supraclavicular adenopathy Lungs no rales or rhonchi Heart regular rate and rhythm Abd soft, nontender, positive bowel sounds MSK no focal spinal tenderness, no upper extremity lymphedema Neuro: nonfocal, well oriented, appropriate affect Breasts: The right breast is status post lumpectomy.  The cosmetic result is excellent.  There are no suspicious masses.  The left breast is benign.  Both axillae are benign.  LAB RESULTS:  CMP     Component Value Date/Time   NA 140 06/09/2017 0842    NA 142 02/18/2017 1221   K 4.0 06/09/2017 0842   K 4.6 02/18/2017 1221   CL 103 06/09/2017 0842   CO2 26 06/09/2017 0842   CO2 27 02/18/2017 1221   GLUCOSE 73 06/09/2017 0842   GLUCOSE 95 02/18/2017 1221   BUN 19 06/09/2017 0842   BUN 18.4 02/18/2017 1221   CREATININE 0.78 06/09/2017 0842   CREATININE 1.0 02/18/2017 1221   CALCIUM 10.0 06/09/2017 0842   CALCIUM 10.3 02/18/2017 1221   PROT 6.9 06/09/2017 0842   PROT 8.3 02/18/2017 1221   ALBUMIN 3.9 06/09/2017 0842   ALBUMIN 4.4 02/18/2017 1221   AST 21 06/09/2017 0842   AST 22 02/18/2017 1221   ALT 70 (H) 06/09/2017 0842   ALT 18 02/18/2017 1221   ALKPHOS 156 (H) 06/09/2017 0842   ALKPHOS 89 02/18/2017 1221   BILITOT <0.2 (L) 06/09/2017 0842   BILITOT 0.48 02/18/2017 1221   GFRNONAA >60 06/09/2017 0842   GFRAA >60 06/09/2017 0842    No results found for: Ronnald Ramp, A1GS, A2GS, BETS, BETA2SER, GAMS, MSPIKE, SPEI  No results found for: Nils Pyle, [facility]  Lab Results  Component Value Date   WBC 1.9 (L) 06/16/2017   NEUTROABS 1.2 (L) 06/16/2017   HGB 13.7 03/23/2017   HCT 32.4 (L) 06/16/2017   MCV 92.6 06/16/2017   PLT 192 06/16/2017      Chemistry      Component Value Date/Time   NA 140 06/09/2017 0842   NA 142 02/18/2017 1221   K 4.0 06/09/2017 0842   K 4.6 02/18/2017 1221   CL 103 06/09/2017 0842   CO2 26 06/09/2017 0842   CO2 27 02/18/2017 1221   BUN 19 06/09/2017 0842   BUN 18.4 02/18/2017 1221   CREATININE 0.78 06/09/2017 0842   CREATININE 1.0 02/18/2017 1221      Component Value Date/Time   CALCIUM 10.0 06/09/2017 0842   CALCIUM 10.3 02/18/2017 1221   ALKPHOS 156 (H) 06/09/2017 0842   ALKPHOS 89 02/18/2017 1221   AST 21 06/09/2017 0842   AST 22 02/18/2017 1221   ALT 70 (H) 06/09/2017 0842   ALT 18 02/18/2017 1221   BILITOT <0.2 (L) 06/09/2017 0842   BILITOT 0.48 02/18/2017 1221       No results found for: LABCA2  No components found for: GBMBOM859  No  results for input(s): INR in the last 168 hours.  No results found for: LABCA2  No results found for: CNG394  No results found for: VQW037  No results found for: DKC461  No results found for: CA2729  No components found for: HGQUANT  No results found for: CEA1 / No results found for: CEA1   No results found for: AFPTUMOR  No results found for: CHROMOGRNA  No results found for: PSA1  Appointment on 06/16/2017  Component Date Value Ref Range Status  . WBC Count 06/16/2017 1.9* 3.9 - 10.3 K/uL Final  . RBC 06/16/2017 3.50* 3.70 - 5.45 MIL/uL Final  . Hemoglobin 06/16/2017 10.7* 11.6 - 15.9 g/dL Final  . HCT 06/16/2017 32.4* 34.8 - 46.6 % Final  . MCV 06/16/2017 92.6  79.5 - 101.0 fL Final  . MCH 06/16/2017 30.6  25.1 - 34.0 pg Final  . MCHC 06/16/2017 33.0  31.5 - 36.0 g/dL Final  . RDW 06/16/2017 15.1* 11.2 - 14.5 % Final  . Platelet Count 06/16/2017 192  145 - 400 K/uL Final  . Neutrophils Relative % 06/16/2017 64  % Final  . Neutro Abs 06/16/2017 1.2* 1.5 - 6.5 K/uL Final  . Lymphocytes Relative 06/16/2017 28  % Final  . Lymphs Abs 06/16/2017 0.6* 0.9 - 3.3 K/uL Final  . Monocytes Relative 06/16/2017 5  % Final  . Monocytes Absolute 06/16/2017 0.1  0.1 - 0.9 K/uL Final  . Eosinophils Relative 06/16/2017 1  % Final  . Eosinophils Absolute 06/16/2017 0.0  0.0 - 0.5 K/uL Final  . Basophils Relative 06/16/2017 2  % Final  . Basophils Absolute 06/16/2017 0.0  0.0 - 0.1 K/uL Final   Performed at [facility], [place] [address], [place], [place] [address]    (this displays the last labs from the last 3 days)  No results found for: TOTALPROTELP, ALBUMINELP, A1GS, A2GS, BETS, BETA2SER, GAMS, MSPIKE, SPEI (this displays SPEP labs)  No results found for: KPAFRELGTCHN, LAMBDASER, KAPLAMBRATIO (kappa/lambda light chains)  No results found for: HGBA, HGBA2QUANT, HGBFQUANT, HGBSQUAN (Hemoglobinopathy evaluation)   No results found for: LDH  No results  found for: IRON, TIBC, IRONPCTSAT (Iron and TIBC)  No results found for: FERRITIN  Urinalysis No results found for: COLORURINE, APPEARANCEUR, LABSPEC, PHURINE, GLUCOSEU, HGBUR, BILIRUBINUR, KETONESUR, PROTEINUR, UROBILINOGEN, NITRITE, LEUKOCYTESUR   STUDIES: No results found.  ELIGIBLE FOR AVAILABLE RESEARCH PROTOCOL: UPBEAT  ASSESSMENT: 52 y.o. [place] woman status post right breast lower inner quadrant biopsy February 09, 2017 for a clinical T1b N0, stage IB invasive ductal carcinoma, grade 3, triple negative  (1) status post right lumpectomy 03/23/2017 for a pT1b pN0, stage IB invasive ductal carcinoma, grade 3, with negative margins.  (2) adjuvant chemotherapy consisting of cyclophosphamide and doxorubicin in dose dense fashion x4 started 04/28/2017, completed 06/09/2017, to be followed by weekly paclitaxel x12  (does not qualify for BR003)  (a) echocardiogram 02/25/2017 showed a baseline ejection fraction in the 60-65%  (3) adjuvant radiation to follow  (4) genetics testing 02/19/2017 through the STAT Breast panel with reflext to Multi-Cancer panel (83 genes) @ [facility] - No pathogenic mutations detected in ALK, APC, ATM, AXIN2, BAP1, BARD1, BLM, BMPR1A, BRCA1, BRCA2, BRIP1, CASR, CDC73, CDH1, CDK4, CDKN1B, CDKN1C, CDKN2A, CEBPA, CHEK2, CTNNA1, DICER1, DIS3L2, EGFR, EPCAM, FH, FLCN, GATA2, GPC3, GREM1, HOXB13, HRAS, KIT, MAX, MEN1, MET, MITF, MLH1, MSH2, MSH3, MSH6, MUTYH, NBN, NF1, NF2, NTHL1, PALB2, PDGFRA, PHOX2B, PMS2, POLD1, POLE, POT1, PRKAR1A, PTCH1, PTEN, RAD50, RAD51C, RAD51D, RB1, RECQL4, RET, RUNX1, SDHA, SDHAF2, SDHB, SDHC, SDHD, SMAD4, SMARCA4, SMARCB1, SMARCE1, STK11, SUFU, TERC, TERT, TMEM127, TP53, TSC1, TSC2, VHL, WRN, WT1.  (a) Variants of Uncertain Significance in PDGFRA c.3040G>A (p.Ala1014Thr) and SMARCA4 c.2044C>G (p.Leu682Val) noted  PLAN: Kelsey Henry has completed the difficult part of  her chemotherapy.  She did terrific, with no dose reductions or delays.  She is  now ready to start the paclitaxel weekly.  We discussed the possible toxicities, complications and side effects of this agent, and particularly the issue regarding peripheral neuropathy.  She will have her first dose next week.  She will see [place] with the second dose to make sure she tolerated the first 1 well and to make any necessary changes in her supportive medicines.  With regards to that I counseled her to take prochlorperazine the night of chemo and ondansetron the next morning.  After that she may take either medicine as needed for nausea  Once she completes the chemotherapy she will be ready to proceed to radiation  She knows to call for any other issues that may develop before the next visit.  Magrinat, Virgie Dad, MD  06/16/17 9:29 AM Medical Oncology and Hematology [facility] [address] [place], [place] [address] Tel. [phone]    Fax. [phone]    This document serves as a record of services personally performed by Lurline Del, MD. It was created on his behalf by Steva Colder, a trained medical scribe. The creation of this record is based on the scribe's personal observations and the provider's statements to them.   I have reviewed the above documentation for accuracy and completeness, and I agree with the above.

## 2017-06-16 ENCOUNTER — Inpatient Hospital Stay: Payer: BLUE CROSS/BLUE SHIELD

## 2017-06-16 ENCOUNTER — Encounter: Payer: Self-pay | Admitting: *Deleted

## 2017-06-16 ENCOUNTER — Inpatient Hospital Stay (HOSPITAL_BASED_OUTPATIENT_CLINIC_OR_DEPARTMENT_OTHER): Payer: BLUE CROSS/BLUE SHIELD | Admitting: Oncology

## 2017-06-16 ENCOUNTER — Inpatient Hospital Stay: Payer: BLUE CROSS/BLUE SHIELD | Attending: Oncology

## 2017-06-16 VITALS — BP 121/74 | HR 91 | Temp 98.4°F | Resp 18 | Ht 61.0 in | Wt 128.9 lb

## 2017-06-16 DIAGNOSIS — C50311 Malignant neoplasm of lower-inner quadrant of right female breast: Secondary | ICD-10-CM | POA: Insufficient documentation

## 2017-06-16 DIAGNOSIS — Z171 Estrogen receptor negative status [ER-]: Secondary | ICD-10-CM | POA: Diagnosis not present

## 2017-06-16 DIAGNOSIS — Z5111 Encounter for antineoplastic chemotherapy: Secondary | ICD-10-CM | POA: Insufficient documentation

## 2017-06-16 DIAGNOSIS — Z95828 Presence of other vascular implants and grafts: Secondary | ICD-10-CM

## 2017-06-16 LAB — CBC WITH DIFFERENTIAL (CANCER CENTER ONLY)
Basophils Absolute: 0 10*3/uL (ref 0.0–0.1)
Basophils Relative: 2 %
EOS ABS: 0 10*3/uL (ref 0.0–0.5)
EOS PCT: 1 %
HCT: 32.4 % — ABNORMAL LOW (ref 34.8–46.6)
Hemoglobin: 10.7 g/dL — ABNORMAL LOW (ref 11.6–15.9)
LYMPHS ABS: 0.6 10*3/uL — AB (ref 0.9–3.3)
Lymphocytes Relative: 28 %
MCH: 30.6 pg (ref 25.1–34.0)
MCHC: 33 g/dL (ref 31.5–36.0)
MCV: 92.6 fL (ref 79.5–101.0)
MONO ABS: 0.1 10*3/uL (ref 0.1–0.9)
MONOS PCT: 5 %
Neutro Abs: 1.2 10*3/uL — ABNORMAL LOW (ref 1.5–6.5)
Neutrophils Relative %: 64 %
PLATELETS: 192 10*3/uL (ref 145–400)
RBC: 3.5 MIL/uL — ABNORMAL LOW (ref 3.70–5.45)
RDW: 15.1 % — AB (ref 11.2–14.5)
WBC Count: 1.9 10*3/uL — ABNORMAL LOW (ref 3.9–10.3)

## 2017-06-16 MED ORDER — SODIUM CHLORIDE 0.9% FLUSH
10.0000 mL | Freq: Once | INTRAVENOUS | Status: AC
  Administered 2017-06-16: 10 mL
  Filled 2017-06-16: qty 10

## 2017-06-16 MED ORDER — HEPARIN SOD (PORK) LOCK FLUSH 100 UNIT/ML IV SOLN
500.0000 [IU] | Freq: Once | INTRAVENOUS | Status: AC
  Administered 2017-06-16: 500 [IU]
  Filled 2017-06-16: qty 5

## 2017-06-16 NOTE — Progress Notes (Signed)
DISCONTINUE ON PATHWAY REGIMEN - Breast  Clinical Trial: Trial: NRG [id number]  REASON: Other Reason PRIOR TREATMENT: Trial: [facility] [id number] TREATMENT RESPONSE: Unable to Evaluate  START ON PATHWAY REGIMEN - Breast   Doxorubicin + Cyclophosphamide (AC):   A cycle is every 21 days:     Doxorubicin      Cyclophosphamide   **Always confirm dose/schedule in your pharmacy ordering system**    Paclitaxel 80 mg/m2 Weekly:   Administer weekly:     Paclitaxel   **Always confirm dose/schedule in your pharmacy ordering system**    Patient Characteristics: Postoperative without Neoadjuvant Therapy (Pathologic Staging), Invasive Disease, Adjuvant Therapy, HER2 Negative/Unknown/Equivocal, ER Negative/Unknown, Node Negative, pT1a-c, pN0/N2m or pT2 or Higher, pN0 Therapeutic Status: Postoperative without Neoadjuvant Therapy (Pathologic Staging) AJCC Grade: G3 AJCC N Category: pN0 AJCC M Category: cM0 ER Status: Negative (-) AJCC 8 Stage Grouping: IB HER2 Status: Negative (-) Oncotype Dx Recurrence Score: Not Appropriate AJCC T Category: pT1b PR Status: Negative (-) Intent of Therapy: Curative Intent, Discussed with Patient

## 2017-06-19 NOTE — Progress Notes (Signed)
[place]  Telephone:([phone]) [phone] Fax:(336) [phone]     ID: Kelsey Henry DOB: 1965/09/09  MR#: [id number]  OBS#:[id number]  Patient Care Team: Patient, No Pcp Per as PCP - General (General Practice) Jarone Ostergaard, Virgie Dad, MD as Consulting Physician (Oncology) Kyung Rudd, MD as Consulting Physician (Radiation Oncology) Rolm Bookbinder, MD as Consulting Physician (General Surgery) Meisinger, Sherren Mocha, MD as Consulting Physician (Obstetrics and Gynecology) Loletta Specter, MD (Unknown Physician Specialty) OTHER MD:  CHIEF COMPLAINT: triple negative breast cancer  CURRENT TREATMENT: adjuvant chemotherapy   HISTORY OF CURRENT ILLNESS: From the original intake note:  Kelsey Henry had routine screening mammography October 2018 showing a possible right breast mass.  She was scheduled for unilateral right mammography and tomography with right breast ultrasonography at the breast center February 06, 2017.  This showed the breast density to be category D.  In the lower inner quadrant of the right breast there was a 0.6 cm oval mass, which by ultrasound measured 0.9 cm and was irregular and hypoechoic.  Ultrasound of the right axilla was sonographically benign.  On February 09, 2017 she underwent biopsy of the right breast mass in question, and this showed ([id number] [phone]) invasive [ductal] carcinoma, grade 3, estrogen and progesterone receptor negative, HER-2/neu not amplified, with a sickness ratio of 1.23 and the number per cell 2.9.  The patient's subsequent history is as detailed below.  INTERVAL HISTORY: Kelsey Henry returns today for follow-up and treatment of her triple negative breast cancer accompanied by her husband. She completed 4 cycles of cyclophosphamide and doxorubicin. Today she starts day 1 cycle 1 of weekly paclitaxel x12. She feels that she has recovered from chemotherapy. She has a good appetite and is eating well. She denies nausea, oral sores, or fatigue.   Her  echocardiogram on 06/01/2017 showed an ejection fraction in the 60-65% range   REVIEW OF SYSTEMS: Kelsey Henry reports that she is doing well. She has been working out and trying to keep her energy levels up. She denies unusual headaches, visual changes, nausea, vomiting, or dizziness. There has been no unusual cough, phlegm production, or pleurisy. This been no change in bowel or bladder habits. She denies unexplained fatigue or unexplained weight loss, bleeding, rash, or fever. A detailed review of systems was otherwise stable.     PAST MEDICAL HISTORY: Past Medical History:  Diagnosis Date  . Cancer [facility])    breast  . Genetic testing 02/19/2017   STAT Breast panel with reflext to Multi-Cancer panel (83 genes) @ [facility] - No pathogenic mutations detected  . GERD (gastroesophageal reflux disease)    no meds  . Seasonal allergies   . SVD (spontaneous vaginal delivery)    x 1    PAST SURGICAL HISTORY: Past Surgical History:  Procedure Laterality Date  . BILATERAL SALPINGECTOMY Bilateral 06/23/2012   Procedure: BILATERAL SALPINGECTOMY;  Surgeon: Cheri Fowler, MD;  Location: [facility];  Service: Gynecology;  Laterality: Bilateral;  . BREAST LUMPECTOMY WITH RADIOACTIVE SEED AND SENTINEL LYMPH NODE BIOPSY Right 03/23/2017   Procedure: BREAST LUMPECTOMY WITH RADIOACTIVE SEED AND SENTINEL LYMPH NODE BIOPSY;  Surgeon: Rolm Bookbinder, MD;  Location: [facility];  Service: General;  Laterality: Right;  . LAPAROSCOPIC SUPRACERVICAL HYSTERECTOMY N/A 06/23/2012   Procedure: LAPAROSCOPIC SUPRACERVICAL HYSTERECTOMY;  Surgeon: Cheri Fowler, MD;  Location: [facility];  Service: Gynecology;  Laterality: N/A;  . PORTACATH PLACEMENT Right 03/23/2017   Procedure: INSERTION PORT-A-CATH;  Surgeon: Rolm Bookbinder, MD;  Location: [facility];  Service: General;  Laterality: Right;  .  TUBAL LIGATION    . VULVAR LESION REMOVAL N/A 11/01/2014   Procedure: MONS PUBIS LESION;  Surgeon: Cheri Fowler, MD;  Location: [facility];   Service: Gynecology;  Laterality: N/A;  local anesthesia with IV sedation if needed  . WISDOM TOOTH EXTRACTION      FAMILY HISTORY No family history on file.The patient has no information regarding her father or his side of the family.  The patient's mother is 57 years old as of November 2018.  The patient has 3 brothers, 2 sisters.  One sister had 2 "benign tumors" removed from the right breast at the age of 71.  A maternal grandfather had prostate cancer in his 48s.  GYNECOLOGIC HISTORY:  Patient's last menstrual period was 05/26/2012. Menarche age 57, first live birth age 35, the patient is GX P1.  She stopped having periods 2012 when she underwent hysterectomy, without salpingo-oophorectomy..  She used hormone replacement for approximately 3 months.  She is used oral contraceptives remotely with no complications.  SOCIAL HISTORY:  Kelsey Henry works as an Psychologist, educational for [employer], in the paternal DNA subsection.  Her husband Lennette Bihari is an Clinical biochemist.  Their son [name] is an Producer, television/film/video and lives in [place].  The patient has no grandchildren.  She is a Psychologist, forensic.    ADVANCED DIRECTIVES: Not in place   HEALTH MAINTENANCE: Social History   Tobacco Use  . Smoking status: Never Smoker  . Smokeless tobacco: Never Used  Substance Use Topics  . Alcohol use: No  . Drug use: No     Colonoscopy: n/a  PAP: Status post hysterectomy  Bone density: n/a   Allergies  Allergen Reactions  . Aspirin Hives    Current Outpatient Medications  Medication Sig Dispense Refill  . docusate sodium (STOOL SOFTENER) 100 MG capsule Take 100 mg by mouth 2 (two) times daily.    Marland Kitchen loratadine (CLARITIN) 10 MG tablet Take 10 mg by mouth daily as needed for allergies.    . Multiple Vitamins-Minerals (MULTIVITAMIN ADULT) CHEW Chew 2 % by mouth.    Marland Kitchen omeprazole (PRILOSEC) 40 MG capsule TAKE 1 CAPSULE BY MOUTH EVERY DAY 90 capsule 0  . ondansetron (ZOFRAN) 8 MG tablet Take 1 tablet (8 mg total) by mouth every 8  (eight) hours as needed for nausea or vomiting. 20 tablet 0  . vitamin C (ASCORBIC ACID) 250 MG tablet Take 250 mg by mouth daily.     No current facility-administered medications for this visit.     OBJECTIVE: Middle-aged African-American woman in no acute distress  Vitals:   06/23/17 0827  BP: 125/80  Pulse: 80  Resp: 18  Temp: 98.4 F (36.9 C)  SpO2: 100%     Body mass index is 23.94 kg/m.   Wt Readings from Last 3 Encounters:  06/23/17 126 lb 11.2 oz (57.5 kg)  06/16/17 128 lb 14.4 oz (58.5 kg)  06/03/17 128 lb 1.6 oz (58.1 kg)      ECOG FS:0 - Asymptomatic  Sclerae unicteric, pupils round and equal Oropharynx clear and moist No cervical or supraclavicular adenopathy Lungs no rales or rhonchi Heart regular rate and rhythm Abd soft, nontender, positive bowel sounds MSK no focal spinal tenderness, no upper extremity lymphedema Neuro: nonfocal, well oriented, appropriate affect Breasts: The right breast is status post lumpectomy.  The cosmetic result is excellent.  There is no evidence of residual or recurrent disease.  The left breast is benign.  Both axillae are benign.  LAB RESULTS:  CMP  Component Value Date/Time   NA 140 06/09/2017 0842   NA 142 02/18/2017 1221   K 4.0 06/09/2017 0842   K 4.6 02/18/2017 1221   CL 103 06/09/2017 0842   CO2 26 06/09/2017 0842   CO2 27 02/18/2017 1221   GLUCOSE 73 06/09/2017 0842   GLUCOSE 95 02/18/2017 1221   BUN 19 06/09/2017 0842   BUN 18.4 02/18/2017 1221   CREATININE 0.78 06/09/2017 0842   CREATININE 1.0 02/18/2017 1221   CALCIUM 10.0 06/09/2017 0842   CALCIUM 10.3 02/18/2017 1221   PROT 6.9 06/09/2017 0842   PROT 8.3 02/18/2017 1221   ALBUMIN 3.9 06/09/2017 0842   ALBUMIN 4.4 02/18/2017 1221   AST 21 06/09/2017 0842   AST 22 02/18/2017 1221   ALT 70 (H) 06/09/2017 0842   ALT 18 02/18/2017 1221   ALKPHOS 156 (H) 06/09/2017 0842   ALKPHOS 89 02/18/2017 1221   BILITOT <0.2 (L) 06/09/2017 0842   BILITOT 0.48  02/18/2017 1221   GFRNONAA >60 06/09/2017 0842   GFRAA >60 06/09/2017 0842    No results found for: Ronnald Ramp, A1GS, A2GS, BETS, BETA2SER, GAMS, MSPIKE, SPEI  No results found for: Nils Pyle, [facility]  Lab Results  Component Value Date   WBC 16.1 (H) 06/23/2017   NEUTROABS 14.0 (H) 06/23/2017   HGB 13.7 03/23/2017   HCT 32.2 (L) 06/23/2017   MCV 90.4 06/23/2017   PLT 184 06/23/2017      Chemistry      Component Value Date/Time   NA 140 06/09/2017 0842   NA 142 02/18/2017 1221   K 4.0 06/09/2017 0842   K 4.6 02/18/2017 1221   CL 103 06/09/2017 0842   CO2 26 06/09/2017 0842   CO2 27 02/18/2017 1221   BUN 19 06/09/2017 0842   BUN 18.4 02/18/2017 1221   CREATININE 0.78 06/09/2017 0842   CREATININE 1.0 02/18/2017 1221      Component Value Date/Time   CALCIUM 10.0 06/09/2017 0842   CALCIUM 10.3 02/18/2017 1221   ALKPHOS 156 (H) 06/09/2017 0842   ALKPHOS 89 02/18/2017 1221   AST 21 06/09/2017 0842   AST 22 02/18/2017 1221   ALT 70 (H) 06/09/2017 0842   ALT 18 02/18/2017 1221   BILITOT <0.2 (L) 06/09/2017 0842   BILITOT 0.48 02/18/2017 1221       No results found for: LABCA2  No components found for: [id number]  No results for input(s): INR in the last 168 hours.  No results found for: LABCA2  No results found for: ZHY865  No results found for: HQI696  No results found for: EXB284  No results found for: CA2729  No components found for: HGQUANT  No results found for: CEA1 / No results found for: CEA1   No results found for: AFPTUMOR  No results found for: CHROMOGRNA  No results found for: PSA1  Appointment on 06/23/2017  Component Date Value Ref Range Status  . WBC Count 06/23/2017 16.1* 3.9 - 10.3 K/uL Final  . RBC 06/23/2017 3.57* 3.70 - 5.45 MIL/uL Final  . Hemoglobin 06/23/2017 11.0* 11.6 - 15.9 g/dL Final  . HCT 06/23/2017 32.2* 34.8 - 46.6 % Final  . MCV 06/23/2017 90.4  79.5 - 101.0 fL Final  . MCH  06/23/2017 30.7  25.1 - 34.0 pg Final  . MCHC 06/23/2017 34.0  31.5 - 36.0 g/dL Final  . RDW 06/23/2017 16.1* 11.2 - 14.5 % Final  . Platelet Count 06/23/2017 184  145 - 400 K/uL Final  .  Neutrophils Relative % 06/23/2017 87  % Final  . Neutro Abs 06/23/2017 14.0* 1.5 - 6.5 K/uL Final  . Lymphocytes Relative 06/23/2017 8  % Final  . Lymphs Abs 06/23/2017 1.2  0.9 - 3.3 K/uL Final  . Monocytes Relative 06/23/2017 5  % Final  . Monocytes Absolute 06/23/2017 0.8  0.1 - 0.9 K/uL Final  . Eosinophils Relative 06/23/2017 0  % Final  . Eosinophils Absolute 06/23/2017 0.1  0.0 - 0.5 K/uL Final  . Basophils Relative 06/23/2017 0  % Final  . Basophils Absolute 06/23/2017 0.1  0.0 - 0.1 K/uL Final   Performed at [facility], [place] [address], [place], [place] [address]    (this displays the last labs from the last 3 days)  No results found for: TOTALPROTELP, ALBUMINELP, A1GS, A2GS, BETS, BETA2SER, GAMS, MSPIKE, SPEI (this displays SPEP labs)  No results found for: KPAFRELGTCHN, LAMBDASER, KAPLAMBRATIO (kappa/lambda light chains)  No results found for: HGBA, HGBA2QUANT, HGBFQUANT, HGBSQUAN (Hemoglobinopathy evaluation)   No results found for: LDH  No results found for: IRON, TIBC, IRONPCTSAT (Iron and TIBC)  No results found for: FERRITIN  Urinalysis No results found for: COLORURINE, APPEARANCEUR, LABSPEC, PHURINE, GLUCOSEU, HGBUR, BILIRUBINUR, KETONESUR, PROTEINUR, UROBILINOGEN, NITRITE, LEUKOCYTESUR   STUDIES: No results found.  ELIGIBLE FOR AVAILABLE RESEARCH PROTOCOL: UPBEAT  ASSESSMENT: 52 y.o. [place] woman status post right breast lower inner quadrant biopsy February 09, 2017 for a clinical T1b N0, stage IB invasive ductal carcinoma, grade 3, triple negative  (1) status post right lumpectomy 03/23/2017 for a pT1b pN0, stage IB invasive ductal carcinoma, grade 3, with negative margins.  (2) adjuvant chemotherapy consisting of cyclophosphamide and  doxorubicin in dose dense fashion x4 started 04/28/2017, completed 06/09/2017, to be followed by weekly paclitaxel x12 starting 06/23/2017  (does not qualify for BR003)  (a) echocardiogram 02/25/2017 showed a baseline ejection fraction in the 60-65% range  (b) echocardiogram 06/01/2017 showed an ejection fraction in the 60-65% range  (3) adjuvant radiation to follow  (4) genetics testing 02/19/2017 through the STAT Breast panel with reflext to Multi-Cancer panel (83 genes) @ [facility] - No pathogenic mutations detected in ALK, APC, ATM, AXIN2, BAP1, BARD1, BLM, BMPR1A, BRCA1, BRCA2, BRIP1, CASR, CDC73, CDH1, CDK4, CDKN1B, CDKN1C, CDKN2A, CEBPA, CHEK2, CTNNA1, DICER1, DIS3L2, EGFR, EPCAM, FH, FLCN, GATA2, GPC3, GREM1, HOXB13, HRAS, KIT, MAX, MEN1, MET, MITF, MLH1, MSH2, MSH3, MSH6, MUTYH, NBN, NF1, NF2, NTHL1, PALB2, PDGFRA, PHOX2B, PMS2, POLD1, POLE, POT1, PRKAR1A, PTCH1, PTEN, RAD50, RAD51C, RAD51D, RB1, RECQL4, RET, RUNX1, SDHA, SDHAF2, SDHB, SDHC, SDHD, SMAD4, SMARCA4, SMARCB1, SMARCE1, STK11, SUFU, TERC, TERT, TMEM127, TP53, TSC1, TSC2, VHL, WRN, WT1.  (a) Variants of Uncertain Significance in PDGFRA c.3040G>A (p.Ala1014Thr) and SMARCA4 c.2044C>G (p.Leu682Val) noted  PLAN: Fredi has recovered nicely from her earlier chemo and is ready to start her treatment with paclitaxel.  Today we again reviewed the possible toxicity side effects and complications and in particular the issue of neuropathy.  At present at baseline she has no neuropathy whatsoever.  She will see [place] again next week just to make sure she tolerated the first cycle well and then she will see me with cycle 3.  If all is going well at that point we will start seeing her every other week  She knows to call for any problems that may develop before the next visit. Payeton Germani, Virgie Dad, MD  06/23/17 8:44 AM I have reviewed the above documentation for accuracy and completeness, and I agree with the above. Medical  Oncology and  Hematology [facility] [address] [place], [place] [address] Tel. [phone]    Fax. [phone]  This document serves as a record of services personally performed by Lurline Del, MD. It was created on his behalf by Sheron Nightingale, a trained medical scribe. The creation of this record is based on the scribe's personal observations and the provider's statements to them.   I have reviewed the above documentation for accuracy and completeness, and I agree with the above.

## 2017-06-22 ENCOUNTER — Other Ambulatory Visit: Payer: Self-pay | Admitting: Oncology

## 2017-06-22 ENCOUNTER — Other Ambulatory Visit: Payer: Self-pay | Admitting: *Deleted

## 2017-06-22 MED ORDER — OMEPRAZOLE 40 MG PO CPDR: DELAYED_RELEASE_CAPSULE | ORAL | 0 refills | Status: DC

## 2017-06-23 ENCOUNTER — Inpatient Hospital Stay: Payer: BLUE CROSS/BLUE SHIELD

## 2017-06-23 ENCOUNTER — Inpatient Hospital Stay (HOSPITAL_BASED_OUTPATIENT_CLINIC_OR_DEPARTMENT_OTHER): Payer: BLUE CROSS/BLUE SHIELD | Admitting: Oncology

## 2017-06-23 VITALS — BP 123/75 | HR 76 | Temp 97.4°F | Resp 16

## 2017-06-23 VITALS — BP 125/80 | HR 80 | Temp 98.4°F | Resp 18 | Ht 61.0 in | Wt 126.7 lb

## 2017-06-23 DIAGNOSIS — D701 Agranulocytosis secondary to cancer chemotherapy: Secondary | ICD-10-CM

## 2017-06-23 DIAGNOSIS — C50311 Malignant neoplasm of lower-inner quadrant of right female breast: Secondary | ICD-10-CM

## 2017-06-23 DIAGNOSIS — Z171 Estrogen receptor negative status [ER-]: Secondary | ICD-10-CM

## 2017-06-23 DIAGNOSIS — Z5111 Encounter for antineoplastic chemotherapy: Secondary | ICD-10-CM | POA: Diagnosis not present

## 2017-06-23 DIAGNOSIS — T451X5A Adverse effect of antineoplastic and immunosuppressive drugs, initial encounter: Secondary | ICD-10-CM

## 2017-06-23 DIAGNOSIS — Z95828 Presence of other vascular implants and grafts: Secondary | ICD-10-CM

## 2017-06-23 LAB — CBC WITH DIFFERENTIAL (CANCER CENTER ONLY)
Basophils Absolute: 0.1 10*3/uL (ref 0.0–0.1)
Basophils Relative: 0 %
EOS ABS: 0.1 10*3/uL (ref 0.0–0.5)
EOS PCT: 0 %
HCT: 32.2 % — ABNORMAL LOW (ref 34.8–46.6)
Hemoglobin: 11 g/dL — ABNORMAL LOW (ref 11.6–15.9)
LYMPHS PCT: 8 %
Lymphs Abs: 1.2 10*3/uL (ref 0.9–3.3)
MCH: 30.7 pg (ref 25.1–34.0)
MCHC: 34 g/dL (ref 31.5–36.0)
MCV: 90.4 fL (ref 79.5–101.0)
MONO ABS: 0.8 10*3/uL (ref 0.1–0.9)
Monocytes Relative: 5 %
Neutro Abs: 14 10*3/uL — ABNORMAL HIGH (ref 1.5–6.5)
Neutrophils Relative %: 87 %
PLATELETS: 184 10*3/uL (ref 145–400)
RBC: 3.57 MIL/uL — ABNORMAL LOW (ref 3.70–5.45)
RDW: 16.1 % — AB (ref 11.2–14.5)
WBC Count: 16.1 10*3/uL — ABNORMAL HIGH (ref 3.9–10.3)

## 2017-06-23 LAB — CMP (CANCER CENTER ONLY)
ALT: 24 U/L (ref 0–55)
AST: 16 U/L (ref 5–34)
Albumin: 3.8 g/dL (ref 3.5–5.0)
Alkaline Phosphatase: 136 U/L (ref 40–150)
Anion gap: 7 (ref 3–11)
BUN: 9 mg/dL (ref 7–26)
CHLORIDE: 107 mmol/L (ref 98–109)
CO2: 26 mmol/L (ref 22–29)
Calcium: 9.8 mg/dL (ref 8.4–10.4)
Creatinine: 0.72 mg/dL (ref 0.60–1.10)
Glucose, Bld: 89 mg/dL (ref 70–140)
POTASSIUM: 3.8 mmol/L (ref 3.5–5.1)
Sodium: 140 mmol/L (ref 136–145)
Total Bilirubin: 0.3 mg/dL (ref 0.2–1.2)
Total Protein: 6.8 g/dL (ref 6.4–8.3)

## 2017-06-23 MED ORDER — PROCHLORPERAZINE MALEATE 10 MG PO TABS: 10.0000 mg | ORAL_TABLET | Freq: Four times a day (QID) | ORAL | 0 refills | Status: DC | PRN

## 2017-06-23 MED ORDER — FAMOTIDINE IN NACL 20-0.9 MG/50ML-% IV SOLN: 20.0000 mg | Freq: Once | INTRAVENOUS | Status: DC

## 2017-06-23 MED ORDER — SODIUM CHLORIDE 0.9 % IV SOLN
Freq: Once | INTRAVENOUS | Status: AC
  Administered 2017-06-23: 09:00:00 via INTRAVENOUS

## 2017-06-23 MED ORDER — SODIUM CHLORIDE 0.9 % IV SOLN
80.0000 mg/m2 | Freq: Once | INTRAVENOUS | Status: AC
  Administered 2017-06-23: 126 mg via INTRAVENOUS
  Filled 2017-06-23: qty 21

## 2017-06-23 MED ORDER — SODIUM CHLORIDE 0.9% FLUSH
10.0000 mL | INTRAVENOUS | Status: DC | PRN
  Administered 2017-06-23: 10 mL
  Filled 2017-06-23: qty 10

## 2017-06-23 MED ORDER — SODIUM CHLORIDE 0.9 % IV SOLN
20.0000 mg | Freq: Once | INTRAVENOUS | Status: AC
  Administered 2017-06-23: 20 mg via INTRAVENOUS
  Filled 2017-06-23: qty 2

## 2017-06-23 MED ORDER — DIPHENHYDRAMINE HCL 50 MG/ML IJ SOLN
INTRAMUSCULAR | Status: AC
  Filled 2017-06-23: qty 1

## 2017-06-23 MED ORDER — HEPARIN SOD (PORK) LOCK FLUSH 100 UNIT/ML IV SOLN
500.0000 [IU] | Freq: Once | INTRAVENOUS | Status: AC | PRN
  Administered 2017-06-23: 500 [IU]
  Filled 2017-06-23: qty 5

## 2017-06-23 MED ORDER — SODIUM CHLORIDE 0.9% FLUSH
10.0000 mL | Freq: Once | INTRAVENOUS | Status: AC
  Administered 2017-06-23: 10 mL
  Filled 2017-06-23: qty 10

## 2017-06-23 MED ORDER — DIPHENHYDRAMINE HCL 50 MG/ML IJ SOLN
25.0000 mg | Freq: Once | INTRAMUSCULAR | Status: AC
  Administered 2017-06-23: 25 mg via INTRAVENOUS

## 2017-06-23 NOTE — Patient Instructions (Signed)
[facility] Discharge Instructions for Patients Receiving Chemotherapy  Today you received the following chemotherapy agents Taxol  To help prevent nausea and vomiting after your treatment, we encourage you to take your nausea medication as directed   If you develop nausea and vomiting that is not controlled by your nausea medication, call the clinic.   BELOW ARE SYMPTOMS THAT SHOULD BE REPORTED IMMEDIATELY:  *FEVER GREATER THAN 100.5 F  *CHILLS WITH OR WITHOUT FEVER  NAUSEA AND VOMITING THAT IS NOT CONTROLLED WITH YOUR NAUSEA MEDICATION  *UNUSUAL SHORTNESS OF BREATH  *UNUSUAL BRUISING OR BLEEDING  TENDERNESS IN MOUTH AND THROAT WITH OR WITHOUT PRESENCE OF ULCERS  *URINARY PROBLEMS  *BOWEL PROBLEMS  UNUSUAL RASH Items with * indicate a potential emergency and should be followed up as soon as possible.  Feel free to call the clinic should you have any questions or concerns. The clinic phone number is [phone].  Please show the CHEMO ALERT CARD at check-in to the Emergency Department and triage nurse.   Paclitaxel injection What is this medicine? PACLITAXEL (PAK li TAX el) is a chemotherapy drug. It targets fast dividing cells, like cancer cells, and causes these cells to die. This medicine is used to treat ovarian cancer, breast cancer, and other cancers. This medicine may be used for other purposes; ask your health care provider or pharmacist if you have questions. COMMON BRAND NAME(S): Onxol, Taxol What should I tell my health care provider before I take this medicine? They need to know if you have any of these conditions: -blood disorders -irregular heartbeat -infection (especially a virus infection such as chickenpox, cold sores, or herpes) -liver disease -previous or ongoing radiation therapy -an unusual or allergic reaction to paclitaxel, alcohol, polyoxyethylated castor oil, other chemotherapy agents, other medicines, foods, dyes, or  preservatives -pregnant or trying to get pregnant -breast-feeding How should I use this medicine? This drug is given as an infusion into a vein. It is administered in a hospital or clinic by a specially trained health care professional. Talk to your pediatrician regarding the use of this medicine in children. Special care may be needed. Overdosage: If you think you have taken too much of this medicine contact a poison control center or emergency room at once. NOTE: This medicine is only for you. Do not share this medicine with others. What if I miss a dose? It is important not to miss your dose. Call your doctor or health care professional if you are unable to keep an appointment. What may interact with this medicine? Do not take this medicine with any of the following medications: -disulfiram -metronidazole This medicine may also interact with the following medications: -cyclosporine -diazepam -ketoconazole -medicines to increase blood counts like filgrastim, pegfilgrastim, sargramostim -other chemotherapy drugs like cisplatin, doxorubicin, epirubicin, etoposide, teniposide, vincristine -quinidine -testosterone -vaccines -verapamil Talk to your doctor or health care professional before taking any of these medicines: -acetaminophen -aspirin -ibuprofen -ketoprofen -naproxen This list may not describe all possible interactions. Give your health care provider a list of all the medicines, herbs, non-prescription drugs, or dietary supplements you use. Also tell them if you smoke, drink alcohol, or use illegal drugs. Some items may interact with your medicine. What should I watch for while using this medicine? Your condition will be monitored carefully while you are receiving this medicine. You will need important blood work done while you are taking this medicine. This medicine can cause serious allergic reactions. To reduce your risk you will need to   take other medicine(s) before  treatment with this medicine. If you experience allergic reactions like skin rash, itching or hives, swelling of the face, lips, or tongue, tell your doctor or health care professional right away. In some cases, you may be given additional medicines to help with side effects. Follow all directions for their use. This drug may make you feel generally unwell. This is not uncommon, as chemotherapy can affect healthy cells as well as cancer cells. Report any side effects. Continue your course of treatment even though you feel ill unless your doctor tells you to stop. Call your doctor or health care professional for advice if you get a fever, chills or sore throat, or other symptoms of a cold or flu. Do not treat yourself. This drug decreases your body's ability to fight infections. Try to avoid being around people who are sick. This medicine may increase your risk to bruise or bleed. Call your doctor or health care professional if you notice any unusual bleeding. Be careful brushing and flossing your teeth or using a toothpick because you may get an infection or bleed more easily. If you have any dental work done, tell your dentist you are receiving this medicine. Avoid taking products that contain aspirin, acetaminophen, ibuprofen, naproxen, or ketoprofen unless instructed by your doctor. These medicines may hide a fever. Do not become pregnant while taking this medicine. Women should inform their doctor if they wish to become pregnant or think they might be pregnant. There is a potential for serious side effects to an unborn child. Talk to your health care professional or pharmacist for more information. Do not breast-feed an infant while taking this medicine. Men are advised not to father a child while receiving this medicine. This product may contain alcohol. Ask your pharmacist or healthcare provider if this medicine contains alcohol. Be sure to tell all healthcare providers you are taking this medicine.  Certain medicines, like metronidazole and disulfiram, can cause an unpleasant reaction when taken with alcohol. The reaction includes flushing, headache, nausea, vomiting, sweating, and increased thirst. The reaction can last from 30 minutes to several hours. What side effects may I notice from receiving this medicine? Side effects that you should report to your doctor or health care professional as soon as possible: -allergic reactions like skin rash, itching or hives, swelling of the face, lips, or tongue -low blood counts - This drug may decrease the number of white blood cells, red blood cells and platelets. You may be at increased risk for infections and bleeding. -signs of infection - fever or chills, cough, sore throat, pain or difficulty passing urine -signs of decreased platelets or bleeding - bruising, pinpoint red spots on the skin, black, tarry stools, nosebleeds -signs of decreased red blood cells - unusually weak or tired, fainting spells, lightheadedness -breathing problems -chest pain -high or low blood pressure -mouth sores -nausea and vomiting -pain, swelling, redness or irritation at the injection site -pain, tingling, numbness in the hands or feet -slow or irregular heartbeat -swelling of the ankle, feet, hands Side effects that usually do not require medical attention (report to your doctor or health care professional if they continue or are bothersome): -bone pain -complete hair loss including hair on your head, underarms, pubic hair, eyebrows, and eyelashes -changes in the color of fingernails -diarrhea -loosening of the fingernails -loss of appetite -muscle or joint pain -red flush to skin -sweating This list may not describe all possible side effects. Call your doctor for medical advice about   side effects. You may report side effects to FDA at 1-800-FDA-1088. Where should I keep my medicine? This drug is given in a hospital or clinic and will not be stored at  home. NOTE: This sheet is a summary. It may not cover all possible information. If you have questions about this medicine, talk to your doctor, pharmacist, or health care provider.  2018 Elsevier/Gold Standard (2015-01-30 19:58:00)   

## 2017-06-24 ENCOUNTER — Telehealth: Payer: Self-pay | Admitting: Oncology

## 2017-06-24 NOTE — Telephone Encounter (Signed)
Per 3/12 no los

## 2017-06-25 ENCOUNTER — Telehealth: Payer: Self-pay | Admitting: *Deleted

## 2017-06-25 NOTE — Telephone Encounter (Signed)
-----   Message from Sinda Du, RN sent at 06/23/2017  9:09 AM EDT ----- Regarding: Dr. Jana Hakim - new chemo New chemo today

## 2017-06-25 NOTE — Telephone Encounter (Signed)
TCT patient to follow up with her after her 1st Taxol treatment on 06/23/17. Spoke with patient. She states she is doing well. No c/o adverse effects from her treatment.  She is aware of # to call with any questions or concerns and she is aware of her upcoming appts.

## 2017-06-30 ENCOUNTER — Inpatient Hospital Stay: Payer: BLUE CROSS/BLUE SHIELD

## 2017-06-30 ENCOUNTER — Other Ambulatory Visit (HOSPITAL_COMMUNITY): Payer: BLUE CROSS/BLUE SHIELD

## 2017-06-30 ENCOUNTER — Encounter: Payer: Self-pay | Admitting: Adult Health

## 2017-06-30 ENCOUNTER — Other Ambulatory Visit: Payer: Self-pay | Admitting: Oncology

## 2017-06-30 ENCOUNTER — Inpatient Hospital Stay (HOSPITAL_BASED_OUTPATIENT_CLINIC_OR_DEPARTMENT_OTHER): Payer: BLUE CROSS/BLUE SHIELD | Admitting: Adult Health

## 2017-06-30 VITALS — BP 132/87 | HR 87 | Temp 98.4°F | Resp 18 | Ht 61.0 in | Wt 127.4 lb

## 2017-06-30 DIAGNOSIS — Z171 Estrogen receptor negative status [ER-]: Principal | ICD-10-CM

## 2017-06-30 DIAGNOSIS — D701 Agranulocytosis secondary to cancer chemotherapy: Secondary | ICD-10-CM

## 2017-06-30 DIAGNOSIS — C50311 Malignant neoplasm of lower-inner quadrant of right female breast: Secondary | ICD-10-CM

## 2017-06-30 DIAGNOSIS — Z5111 Encounter for antineoplastic chemotherapy: Secondary | ICD-10-CM | POA: Diagnosis not present

## 2017-06-30 DIAGNOSIS — T451X5A Adverse effect of antineoplastic and immunosuppressive drugs, initial encounter: Principal | ICD-10-CM

## 2017-06-30 LAB — CBC WITH DIFFERENTIAL (CANCER CENTER ONLY)
BASOS PCT: 1 %
Basophils Absolute: 0.1 10*3/uL (ref 0.0–0.1)
EOS ABS: 0 10*3/uL (ref 0.0–0.5)
Eosinophils Relative: 1 %
HCT: 32.5 % — ABNORMAL LOW (ref 34.8–46.6)
HEMOGLOBIN: 11 g/dL — AB (ref 11.6–15.9)
Lymphocytes Relative: 14 %
Lymphs Abs: 1.4 10*3/uL (ref 0.9–3.3)
MCH: 30.6 pg (ref 25.1–34.0)
MCHC: 33.9 g/dL (ref 31.5–36.0)
MCV: 90.4 fL (ref 79.5–101.0)
Monocytes Absolute: 0.6 10*3/uL (ref 0.1–0.9)
Monocytes Relative: 7 %
Neutro Abs: 7.5 10*3/uL — ABNORMAL HIGH (ref 1.5–6.5)
Neutrophils Relative %: 77 %
Platelet Count: 356 10*3/uL (ref 145–400)
RBC: 3.6 MIL/uL — ABNORMAL LOW (ref 3.70–5.45)
RDW: 15.9 % — AB (ref 11.2–14.5)
WBC Count: 9.6 10*3/uL (ref 3.9–10.3)

## 2017-06-30 MED ORDER — PACLITAXEL CHEMO INJECTION 300 MG/50ML
80.0000 mg/m2 | Freq: Once | INTRAVENOUS | Status: AC
  Administered 2017-06-30: 126 mg via INTRAVENOUS
  Filled 2017-06-30: qty 21

## 2017-06-30 MED ORDER — HEPARIN SOD (PORK) LOCK FLUSH 100 UNIT/ML IV SOLN
500.0000 [IU] | Freq: Once | INTRAVENOUS | Status: AC | PRN
  Administered 2017-06-30: 500 [IU]
  Filled 2017-06-30: qty 5

## 2017-06-30 MED ORDER — SODIUM CHLORIDE 0.9 % IV SOLN
Freq: Once | INTRAVENOUS | Status: AC
  Administered 2017-06-30: 09:00:00 via INTRAVENOUS

## 2017-06-30 MED ORDER — DEXAMETHASONE SODIUM PHOSPHATE 10 MG/ML IJ SOLN
INTRAMUSCULAR | Status: AC
  Filled 2017-06-30: qty 1

## 2017-06-30 MED ORDER — SODIUM CHLORIDE 0.9 % IV SOLN: 10.0000 mg | Freq: Once | INTRAVENOUS | Status: DC

## 2017-06-30 MED ORDER — DEXAMETHASONE SODIUM PHOSPHATE 10 MG/ML IJ SOLN
10.0000 mg | Freq: Once | INTRAMUSCULAR | Status: AC
  Administered 2017-06-30: 10 mg via INTRAVENOUS

## 2017-06-30 MED ORDER — DIPHENHYDRAMINE HCL 50 MG/ML IJ SOLN
INTRAMUSCULAR | Status: AC
  Filled 2017-06-30: qty 1

## 2017-06-30 MED ORDER — FAMOTIDINE IN NACL 20-0.9 MG/50ML-% IV SOLN
20.0000 mg | Freq: Once | INTRAVENOUS | Status: DC
Start: 2017-06-30 — End: 2017-06-30

## 2017-06-30 MED ORDER — DIPHENHYDRAMINE HCL 50 MG/ML IJ SOLN
25.0000 mg | Freq: Once | INTRAMUSCULAR | Status: AC
  Administered 2017-06-30: 25 mg via INTRAVENOUS

## 2017-06-30 MED ORDER — SODIUM CHLORIDE 0.9% FLUSH
10.0000 mL | INTRAVENOUS | Status: DC | PRN
  Administered 2017-06-30: 10 mL
  Filled 2017-06-30: qty 10

## 2017-06-30 MED ORDER — SODIUM CHLORIDE 0.9 % IV SOLN
20.0000 mg | Freq: Once | INTRAVENOUS | Status: AC
  Administered 2017-06-30: 20 mg via INTRAVENOUS
  Filled 2017-06-30: qty 2

## 2017-06-30 NOTE — Patient Instructions (Signed)
[facility] Discharge Instructions for Patients Receiving Chemotherapy  Today you received the following chemotherapy agents Taxol  To help prevent nausea and vomiting after your treatment, we encourage you to take your nausea medication as directed   If you develop nausea and vomiting that is not controlled by your nausea medication, call the clinic.   BELOW ARE SYMPTOMS THAT SHOULD BE REPORTED IMMEDIATELY:  *FEVER GREATER THAN 100.5 F  *CHILLS WITH OR WITHOUT FEVER  NAUSEA AND VOMITING THAT IS NOT CONTROLLED WITH YOUR NAUSEA MEDICATION  *UNUSUAL SHORTNESS OF BREATH  *UNUSUAL BRUISING OR BLEEDING  TENDERNESS IN MOUTH AND THROAT WITH OR WITHOUT PRESENCE OF ULCERS  *URINARY PROBLEMS  *BOWEL PROBLEMS  UNUSUAL RASH Items with * indicate a potential emergency and should be followed up as soon as possible.  Feel free to call the clinic should you have any questions or concerns. The clinic phone number is [phone].  Please show the CHEMO ALERT CARD at check-in to the Emergency Department and triage nurse.   Paclitaxel injection What is this medicine? PACLITAXEL (PAK li TAX el) is a chemotherapy drug. It targets fast dividing cells, like cancer cells, and causes these cells to die. This medicine is used to treat ovarian cancer, breast cancer, and other cancers. This medicine may be used for other purposes; ask your health care provider or pharmacist if you have questions. COMMON BRAND NAME(S): Onxol, Taxol What should I tell my health care provider before I take this medicine? They need to know if you have any of these conditions: -blood disorders -irregular heartbeat -infection (especially a virus infection such as chickenpox, cold sores, or herpes) -liver disease -previous or ongoing radiation therapy -an unusual or allergic reaction to paclitaxel, alcohol, polyoxyethylated castor oil, other chemotherapy agents, other medicines, foods, dyes, or  preservatives -pregnant or trying to get pregnant -breast-feeding How should I use this medicine? This drug is given as an infusion into a vein. It is administered in a hospital or clinic by a specially trained health care professional. Talk to your pediatrician regarding the use of this medicine in children. Special care may be needed. Overdosage: If you think you have taken too much of this medicine contact a poison control center or emergency room at once. NOTE: This medicine is only for you. Do not share this medicine with others. What if I miss a dose? It is important not to miss your dose. Call your doctor or health care professional if you are unable to keep an appointment. What may interact with this medicine? Do not take this medicine with any of the following medications: -disulfiram -metronidazole This medicine may also interact with the following medications: -cyclosporine -diazepam -ketoconazole -medicines to increase blood counts like filgrastim, pegfilgrastim, sargramostim -other chemotherapy drugs like cisplatin, doxorubicin, epirubicin, etoposide, teniposide, vincristine -quinidine -testosterone -vaccines -verapamil Talk to your doctor or health care professional before taking any of these medicines: -acetaminophen -aspirin -ibuprofen -ketoprofen -naproxen This list may not describe all possible interactions. Give your health care provider a list of all the medicines, herbs, non-prescription drugs, or dietary supplements you use. Also tell them if you smoke, drink alcohol, or use illegal drugs. Some items may interact with your medicine. What should I watch for while using this medicine? Your condition will be monitored carefully while you are receiving this medicine. You will need important blood work done while you are taking this medicine. This medicine can cause serious allergic reactions. To reduce your risk you will need to   take other medicine(s) before  treatment with this medicine. If you experience allergic reactions like skin rash, itching or hives, swelling of the face, lips, or tongue, tell your doctor or health care professional right away. In some cases, you may be given additional medicines to help with side effects. Follow all directions for their use. This drug may make you feel generally unwell. This is not uncommon, as chemotherapy can affect healthy cells as well as cancer cells. Report any side effects. Continue your course of treatment even though you feel ill unless your doctor tells you to stop. Call your doctor or health care professional for advice if you get a fever, chills or sore throat, or other symptoms of a cold or flu. Do not treat yourself. This drug decreases your body's ability to fight infections. Try to avoid being around people who are sick. This medicine may increase your risk to bruise or bleed. Call your doctor or health care professional if you notice any unusual bleeding. Be careful brushing and flossing your teeth or using a toothpick because you may get an infection or bleed more easily. If you have any dental work done, tell your dentist you are receiving this medicine. Avoid taking products that contain aspirin, acetaminophen, ibuprofen, naproxen, or ketoprofen unless instructed by your doctor. These medicines may hide a fever. Do not become pregnant while taking this medicine. Women should inform their doctor if they wish to become pregnant or think they might be pregnant. There is a potential for serious side effects to an unborn child. Talk to your health care professional or pharmacist for more information. Do not breast-feed an infant while taking this medicine. Men are advised not to father a child while receiving this medicine. This product may contain alcohol. Ask your pharmacist or healthcare provider if this medicine contains alcohol. Be sure to tell all healthcare providers you are taking this medicine.  Certain medicines, like metronidazole and disulfiram, can cause an unpleasant reaction when taken with alcohol. The reaction includes flushing, headache, nausea, vomiting, sweating, and increased thirst. The reaction can last from 30 minutes to several hours. What side effects may I notice from receiving this medicine? Side effects that you should report to your doctor or health care professional as soon as possible: -allergic reactions like skin rash, itching or hives, swelling of the face, lips, or tongue -low blood counts - This drug may decrease the number of white blood cells, red blood cells and platelets. You may be at increased risk for infections and bleeding. -signs of infection - fever or chills, cough, sore throat, pain or difficulty passing urine -signs of decreased platelets or bleeding - bruising, pinpoint red spots on the skin, black, tarry stools, nosebleeds -signs of decreased red blood cells - unusually weak or tired, fainting spells, lightheadedness -breathing problems -chest pain -high or low blood pressure -mouth sores -nausea and vomiting -pain, swelling, redness or irritation at the injection site -pain, tingling, numbness in the hands or feet -slow or irregular heartbeat -swelling of the ankle, feet, hands Side effects that usually do not require medical attention (report to your doctor or health care professional if they continue or are bothersome): -bone pain -complete hair loss including hair on your head, underarms, pubic hair, eyebrows, and eyelashes -changes in the color of fingernails -diarrhea -loosening of the fingernails -loss of appetite -muscle or joint pain -red flush to skin -sweating This list may not describe all possible side effects. Call your doctor for medical advice about   side effects. You may report side effects to FDA at 1-800-FDA-1088. Where should I keep my medicine? This drug is given in a hospital or clinic and will not be stored at  home. NOTE: This sheet is a summary. It may not cover all possible information. If you have questions about this medicine, talk to your doctor, pharmacist, or health care provider.  2018 Elsevier/Gold Standard (2015-01-30 19:58:00)   

## 2017-06-30 NOTE — Progress Notes (Signed)
Per Dr Jana Hakim ok to use CMP from 06/23/2017 for todays tx.

## 2017-06-30 NOTE — Progress Notes (Signed)
[place]  [phone]) [phone] [phone]) [phone]     ID: Kelsey Henry DOB: 10-23-1965  MR#: [id number]  CSN#:[id number]  Patient Care Team: Patient, No Pcp Per as PCP - General (General Practice) Magrinat, Virgie Dad, MD as Consulting Physician (Oncology) Kyung Rudd, MD as Consulting Physician (Radiation Oncology) Rolm Bookbinder, MD as Consulting Physician (General Surgery) Meisinger, Sherren Mocha, MD as Consulting Physician (Obstetrics and Gynecology) Loletta Specter, MD (Unknown Physician Specialty) OTHER MD:  CHIEF COMPLAINT: triple negative breast cancer  CURRENT TREATMENT: adjuvant chemotherapy   HISTORY OF CURRENT ILLNESS: From the original intake note:  Kelsey Henry had routine screening mammography October 2018 showing a possible right breast mass.  She was scheduled for unilateral right mammography and tomography with right breast ultrasonography at the breast center February 06, 2017.  This showed the breast density to be category D.  In the lower inner quadrant of the right breast there was a 0.6 cm oval mass, which by ultrasound measured 0.9 cm and was irregular and hypoechoic.  Ultrasound of the right axilla was sonographically benign.  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  The patient's subsequent history is as detailed below.  INTERVAL HISTORY: Kelsey Henry returns today for follow-up and treatment of her triple negative breast cancer accompanied by her husband. She completed 4 cycles of cyclophosphamide and doxorubicin. Today she will receive day 1 cycle 2 of weekly paclitaxel x12.   REVIEW OF SYSTEMS: Kelsey Henry reports that she is doing well. She is slightly fatigued and has some mild taste changes, but is doing her  best to eat healthy.  She tolerated her first cycle of Paclitaxel very well.  She denies any bony pain, peripheral neuropathy, chest pain, palpitations, rash, or any other issues.  A detailed ROS was conducted and is non contributory.      PAST MEDICAL HISTORY: Past Medical History:  Diagnosis Date  . Cancer [facility])    breast  . Genetic testing 02/19/2017   STAT Breast panel with reflext to Multi-Cancer panel (83 genes) @ [facility] - No pathogenic mutations detected  . GERD (gastroesophageal reflux disease)    no meds  . Seasonal allergies   . SVD (spontaneous vaginal delivery)    x 1    PAST SURGICAL HISTORY: Past Surgical History:  Procedure Laterality Date  . BILATERAL SALPINGECTOMY Bilateral 06/23/2012   Procedure: BILATERAL SALPINGECTOMY;  Surgeon: Cheri Fowler, MD;  Location: [facility];  Service: Gynecology;  Laterality: Bilateral;  . BREAST LUMPECTOMY WITH RADIOACTIVE SEED AND SENTINEL LYMPH NODE BIOPSY Right 03/23/2017   Procedure: BREAST LUMPECTOMY WITH RADIOACTIVE SEED AND SENTINEL LYMPH NODE BIOPSY;  Surgeon: Rolm Bookbinder, MD;  Location: [facility];  Service: General;  Laterality: Right;  . LAPAROSCOPIC SUPRACERVICAL HYSTERECTOMY N/A 06/23/2012   Procedure: LAPAROSCOPIC SUPRACERVICAL HYSTERECTOMY;  Surgeon: Cheri Fowler, MD;  Location: [facility];  Service: Gynecology;  Laterality: N/A;  . PORTACATH PLACEMENT Right 03/23/2017   Procedure: INSERTION PORT-A-CATH;  Surgeon: Rolm Bookbinder, MD;  Location: [facility];  Service: General;  Laterality: Right;  . TUBAL LIGATION    . VULVAR LESION REMOVAL N/A 11/01/2014   Procedure: MONS PUBIS LESION;  Surgeon: Cheri Fowler, MD;  Location: [facility];  Service: Gynecology;  Laterality: N/A;  local anesthesia with IV sedation if needed  . WISDOM TOOTH  EXTRACTION      FAMILY HISTORY No family history on file.The patient has no information regarding her father or his side of the family.  The patient's mother is 68 years old as of November 2018.   The patient has 3 brothers, 2 sisters.  One sister had 2 "benign tumors" removed from the right breast at the age of 70.  A maternal grandfather had prostate cancer in his 34s.  GYNECOLOGIC HISTORY:  Patient's last menstrual period was 05/26/2012. Menarche age 56, first live birth age 70, the patient is GX P1.  She stopped having periods 2012 when she underwent hysterectomy, without salpingo-oophorectomy..  She used hormone replacement for approximately 3 months.  She is used oral contraceptives remotely with no complications.  SOCIAL HISTORY:  Kelsey Henry works as an Psychologist, educational for [employer], in the paternal DNA subsection.  Her husband Kelsey Henry is an Clinical biochemist.  Their son Kelsey Henry is an Producer, television/film/video and lives in [place].  The patient has no grandchildren.  She is a Psychologist, forensic.   ADVANCED DIRECTIVES: Not in place   HEALTH MAINTENANCE: Social History   Tobacco Use  . Smoking status: Never Smoker  . Smokeless tobacco: Never Used  Substance Use Topics  . Alcohol use: No  . Drug use: No     Colonoscopy: n/a  PAP: Status post hysterectomy  Bone density: n/a   Allergies  Allergen Reactions  . Aspirin Hives    Current Outpatient Medications  Medication Sig Dispense Refill  . docusate sodium (STOOL SOFTENER) 100 MG capsule Take 100 mg by mouth 2 (two) times daily.    Marland Kitchen loratadine (CLARITIN) 10 MG tablet Take 10 mg by mouth daily as needed for allergies.    . Multiple Vitamins-Minerals (MULTIVITAMIN ADULT) CHEW Chew 2 % by mouth.    Marland Kitchen omeprazole (PRILOSEC) 40 MG capsule TAKE 1 CAPSULE BY MOUTH EVERY DAY 90 capsule 0  . ondansetron (ZOFRAN) 8 MG tablet Take 1 tablet (8 mg total) by mouth every 8 (eight) hours as needed for nausea or vomiting. 20 tablet 0  . prochlorperazine (COMPAZINE) 10 MG tablet Take 1 tablet (10 mg total) by mouth every 6 (six) hours as needed for nausea or vomiting. 30 tablet 0  . vitamin C (ASCORBIC ACID) 250 MG tablet Take 250 mg by mouth daily.     No current  facility-administered medications for this visit.    Facility-Administered Medications Ordered in Other Visits  Medication Dose Route Frequency Provider Last Rate Last Dose  . 0.9 %  sodium chloride infusion   Intravenous Once Magrinat, Virgie Dad, MD      . dexamethasone (DECADRON) 10 mg in sodium chloride 0.9 % 50 mL IVPB  10 mg Intravenous Once Magrinat, Virgie Dad, MD      . diphenhydrAMINE (BENADRYL) injection 25 mg  25 mg Intravenous Once Magrinat, Virgie Dad, MD      . famotidine (PEPCID) IVPB 20 mg premix  20 mg Intravenous Once Magrinat, Virgie Dad, MD      . heparin lock flush 100 unit/mL  500 Units Intracatheter Once PRN Magrinat, Virgie Dad, MD      . PACLitaxel (TAXOL) 126 mg in dextrose 5 % 250 mL chemo infusion (</= 78m/m2)  80 mg/m2 (Treatment Plan Recorded) Intravenous Once Magrinat, GVirgie Dad MD      . sodium chloride flush (NS) 0.9 % injection 10 mL  10 mL Intracatheter PRN Magrinat, GVirgie Dad MD        OBJECTIVE:   Vitals:  06/30/17 0810  BP: 132/87  Pulse: 87  Resp: 18  Temp: 98.4 F (36.9 C)  SpO2: 100%     Body mass index is 24.07 kg/m.   Wt Readings from Last 3 Encounters:  06/30/17 127 lb 6.4 oz (57.8 kg)  06/23/17 126 lb 11.2 oz (57.5 kg)  06/16/17 128 lb 14.4 oz (58.5 kg)  ECOG FS:1 GENERAL: Patient is a well appearing female in no acute distress HEENT:  Sclerae anicteric.  Oropharynx clear and moist. No ulcerations or evidence of oropharyngeal candidiasis. Neck is supple.  NODES:  No cervical, supraclavicular, or axillary lymphadenopathy palpated.  BREAST EXAM:  Deferred. LUNGS:  Clear to auscultation bilaterally.  No wheezes or rhonchi. HEART:  Regular rate and rhythm. No murmur appreciated. ABDOMEN:  Soft, nontender.  Positive, normoactive bowel sounds. No organomegaly palpated. MSK:  No focal spinal tenderness to palpation. Full range of motion bilaterally in the upper extremities. EXTREMITIES:  No peripheral edema.   SKIN:  Clear with no obvious rashes  or skin changes. No nail dyscrasia. NEURO:  Nonfocal. Well oriented.  Appropriate affect.    LAB RESULTS:  CMP     Component Value Date/Time   NA 140 06/23/2017 0810   NA 142 02/18/2017 1221   K 3.8 06/23/2017 0810   K 4.6 02/18/2017 1221   CL 107 06/23/2017 0810   CO2 26 06/23/2017 0810   CO2 27 02/18/2017 1221   GLUCOSE 89 06/23/2017 0810   GLUCOSE 95 02/18/2017 1221   BUN 9 06/23/2017 0810   BUN 18.4 02/18/2017 1221   CREATININE 0.72 06/23/2017 0810   CREATININE 1.0 02/18/2017 1221   CALCIUM 9.8 06/23/2017 0810   CALCIUM 10.3 02/18/2017 1221   PROT 6.8 06/23/2017 0810   PROT 8.3 02/18/2017 1221   ALBUMIN 3.8 06/23/2017 0810   ALBUMIN 4.4 02/18/2017 1221   AST 16 06/23/2017 0810   AST 22 02/18/2017 1221   ALT 24 06/23/2017 0810   ALT 18 02/18/2017 1221   ALKPHOS 136 06/23/2017 0810   ALKPHOS 89 02/18/2017 1221   BILITOT 0.3 06/23/2017 0810   BILITOT 0.48 02/18/2017 1221   GFRNONAA >60 06/23/2017 0810   GFRAA >60 06/23/2017 0810    No results found for: TOTALPROTELP, ALBUMINELP, A1GS, A2GS, BETS, BETA2SER, GAMS, MSPIKE, SPEI  No results found for: Nils Pyle, [facility]  Lab Results  Component Value Date   WBC 9.6 06/30/2017   NEUTROABS 7.5 (H) 06/30/2017   HGB 13.7 03/23/2017   HCT 32.5 (L) 06/30/2017   MCV 90.4 06/30/2017   PLT 356 06/30/2017      Chemistry      Component Value Date/Time   NA 140 06/23/2017 0810   NA 142 02/18/2017 1221   K 3.8 06/23/2017 0810   K 4.6 02/18/2017 1221   CL 107 06/23/2017 0810   CO2 26 06/23/2017 0810   CO2 27 02/18/2017 1221   BUN 9 06/23/2017 0810   BUN 18.4 02/18/2017 1221   CREATININE 0.72 06/23/2017 0810   CREATININE 1.0 02/18/2017 1221      Component Value Date/Time   CALCIUM 9.8 06/23/2017 0810   CALCIUM 10.3 02/18/2017 1221   ALKPHOS 136 06/23/2017 0810   ALKPHOS 89 02/18/2017 1221   AST 16 06/23/2017 0810   AST 22 02/18/2017 1221   ALT 24 06/23/2017 0810   ALT 18 02/18/2017 1221    BILITOT 0.3 06/23/2017 0810   BILITOT 0.48 02/18/2017 1221       No results found for: LABCA2  No components found for: QGBEEF007  No results for input(s): INR in the last 168 hours.  No results found for: LABCA2  No results found for: HQR975  No results found for: OIT254  No results found for: DIY641  No results found for: CA2729  No components found for: HGQUANT  No results found for: CEA1 / No results found for: CEA1   No results found for: AFPTUMOR  No results found for: CHROMOGRNA  No results found for: PSA1  Appointment on 06/30/2017  Component Date Value Ref Range Status  . WBC Count 06/30/2017 9.6  3.9 - 10.3 K/uL Final  . RBC 06/30/2017 3.60* 3.70 - 5.45 MIL/uL Final  . Hemoglobin 06/30/2017 11.0* 11.6 - 15.9 g/dL Final  . HCT 06/30/2017 32.5* 34.8 - 46.6 % Final  . MCV 06/30/2017 90.4  79.5 - 101.0 fL Final  . MCH 06/30/2017 30.6  25.1 - 34.0 pg Final  . MCHC 06/30/2017 33.9  31.5 - 36.0 g/dL Final  . RDW 06/30/2017 15.9* 11.2 - 14.5 % Final  . Platelet Count 06/30/2017 356  145 - 400 K/uL Final  . Neutrophils Relative % 06/30/2017 77  % Final  . Neutro Abs 06/30/2017 7.5* 1.5 - 6.5 K/uL Final  . Lymphocytes Relative 06/30/2017 14  % Final  . Lymphs Abs 06/30/2017 1.4  0.9 - 3.3 K/uL Final  . Monocytes Relative 06/30/2017 7  % Final  . Monocytes Absolute 06/30/2017 0.6  0.1 - 0.9 K/uL Final  . Eosinophils Relative 06/30/2017 1  % Final  . Eosinophils Absolute 06/30/2017 0.0  0.0 - 0.5 K/uL Final  . Basophils Relative 06/30/2017 1  % Final  . Basophils Absolute 06/30/2017 0.1  0.0 - 0.1 K/uL Final   Performed at [facility], [place] [address], [place], [place] [address]    (this displays the last labs from the last 3 days)  No results found for: TOTALPROTELP, ALBUMINELP, A1GS, A2GS, BETS, BETA2SER, GAMS, MSPIKE, SPEI (this displays SPEP labs)  No results found for: KPAFRELGTCHN, LAMBDASER, KAPLAMBRATIO (kappa/lambda light  chains)  No results found for: HGBA, HGBA2QUANT, HGBFQUANT, HGBSQUAN (Hemoglobinopathy evaluation)   No results found for: LDH  No results found for: IRON, TIBC, IRONPCTSAT (Iron and TIBC)  No results found for: FERRITIN  Urinalysis No results found for: COLORURINE, APPEARANCEUR, LABSPEC, PHURINE, GLUCOSEU, HGBUR, BILIRUBINUR, KETONESUR, PROTEINUR, UROBILINOGEN, NITRITE, LEUKOCYTESUR   STUDIES: No results found.  ELIGIBLE FOR AVAILABLE RESEARCH PROTOCOL: UPBEAT  ASSESSMENT: 52 y.o. [place] woman status post right breast lower inner quadrant biopsy February 09, 2017 for a clinical T1b N0, stage IB invasive ductal carcinoma, grade 3, triple negative  (1) status post right lumpectomy 03/23/2017 for a pT1b pN0, stage IB invasive ductal carcinoma, grade 3, with negative margins.  (2) adjuvant chemotherapy consisting of cyclophosphamide and doxorubicin in dose dense fashion x4 started 04/28/2017, completed 06/09/2017, to be followed by weekly paclitaxel x12 starting 06/23/2017  (does not qualify for BR003)  (a) echocardiogram 02/25/2017 showed a baseline ejection fraction in the 60-65% range  (b) echocardiogram 06/01/2017 showed an ejection fraction in the 60-65% range  (3) adjuvant radiation to follow  (4) genetics testing 02/19/2017 through the STAT Breast panel with reflext to Multi-Cancer panel (83 genes) @ [facility] - No pathogenic mutations detected in ALK, APC, ATM, AXIN2, BAP1, BARD1, BLM, BMPR1A, BRCA1, BRCA2, BRIP1, CASR, CDC73, CDH1, CDK4, CDKN1B, CDKN1C, CDKN2A, CEBPA, CHEK2, CTNNA1, DICER1, DIS3L2, EGFR, EPCAM, FH, FLCN, GATA2, GPC3, GREM1, HOXB13, HRAS, KIT, MAX, MEN1, MET, MITF,  MLH1, MSH2, MSH3, MSH6, MUTYH, NBN, NF1, NF2, NTHL1, PALB2, PDGFRA, PHOX2B, PMS2, POLD1, POLE, POT1, PRKAR1A, PTCH1, PTEN, RAD50, RAD51C, RAD51D, RB1, RECQL4, RET, RUNX1, SDHA, SDHAF2, SDHB, SDHC, SDHD, SMAD4, SMARCA4, SMARCB1, SMARCE1, STK11, SUFU, TERC, TERT, TMEM127, TP53, TSC1, TSC2, VHL, WRN,  WT1.  (a) Variants of Uncertain Significance in PDGFRA c.3040G>A (p.Ala1014Thr) and SMARCA4 c.2044C>G (p.Leu682Val) noted  PLAN:  Luciana is doing well today.  Her CBC remains stable and I reviewed this with her in detail.  She will proceed with chemotherapy.  I reviewed with Dr. Jana Hakim that she did not have a CMET drawn today, and she had a slight elevation in her alk phos and ALT on 2/26.  Dr. Jana Hakim cleared patient to proceed with chemotherapy today without the CMET, and we will change the CMET to weekly to start next week.   Briggette tolerated her Paclitaxel well and will continue this weekly.  We again reviewed possible side effects to monitor for, particularly peripheral neuropathy.  She will see Dr. Jana Hakim next week, and then see [place] with every other treatment, until she nears the end of treatment, and then she will see [place] weekly at that point.  She verbalizes understanding of this plan.    Josalyn knows to call for any questions or concerns prior to her next appointment with [place].   A total of (30) minutes of face-to-face time was spent with this patient with greater than 50% of that time in counseling and care-coordination.  Wilber Bihari, NP 06/30/17 9:04 AM  Medical Oncology and Hematology [facility] [address] [place], [place] [address] Tel. [phone]    Fax. [phone]

## 2017-07-02 ENCOUNTER — Ambulatory Visit (HOSPITAL_COMMUNITY)
Admission: RE | Admit: 2017-07-02 | Discharge: 2017-07-02 | Disposition: A | Discharge status: Home / Self Care | Payer: BLUE CROSS/BLUE SHIELD | Source: Ambulatory Visit | Attending: Internal Medicine | Admitting: Internal Medicine

## 2017-07-02 DIAGNOSIS — C50311 Malignant neoplasm of lower-inner quadrant of right female breast: Secondary | ICD-10-CM

## 2017-07-02 DIAGNOSIS — Z171 Estrogen receptor negative status [ER-]: Secondary | ICD-10-CM | POA: Diagnosis not present

## 2017-07-02 NOTE — Progress Notes (Signed)
[facility]  Telephone:([phone]) [phone] Fax:(336) [phone]     ID: Kelsey Henry DOB: [birth year]  MR#: [id number]  CHE#:[id number]  Patient Care Team: Patient, No Pcp Per as PCP - General (General Practice) Tariq Pernell, Virgie Dad, MD as Consulting Physician (Oncology) Kyung Rudd, MD as Consulting Physician (Radiation Oncology) Rolm Bookbinder, MD as Consulting Physician (General Surgery) Meisinger, Sherren Mocha, MD as Consulting Physician (Obstetrics and Gynecology) Loletta Specter, MD (Unknown Physician Specialty) OTHER MD:  CHIEF COMPLAINT: triple negative breast cancer  CURRENT TREATMENT: adjuvant chemotherapy   HISTORY OF CURRENT ILLNESS: From the original intake note:  Kelsey Henry had routine screening mammography October 2018 showing a possible right breast mass.  She was scheduled for unilateral right mammography and tomography with right breast ultrasonography at the breast center February 06, 2017.  This showed the breast density to be category D.  In the lower inner quadrant of the right breast there was a 0.6 cm oval mass, which by ultrasound measured 0.9 cm and was irregular and hypoechoic.  Ultrasound of the right axilla was sonographically benign.  On February 09, 2017 she underwent biopsy of the right breast mass in question, and this showed ([facility] [phone]) invasive [ductal] carcinoma, grade 3, estrogen and progesterone receptor negative, HER-2/neu not amplified, with a sickness ratio of 1.23 and the number per cell 2.9.  The patient's subsequent history is as detailed below.  INTERVAL HISTORY: Kelsey Henry returns today for follow-up and treatment of her triple negative breast cancer, accompanied by her husband. Today is day 1 cycle 3 of weekly paclitaxel x12. She has tolerated this well so far. She hasn't noticed a difference in her symptoms between cycle 1 and 2. She occasionally experiences a slight bloody nose when she blows. She also has darker stools. Her finger  nails are darkening. She denies issues with peripheral neuropathy.    Her most recent echocardiogram 07/02/2017 showed an ejection fraction in the 60-65% range   REVIEW OF SYSTEMS: Kelsey Henry reports that she is doing well overall. She does cardio exercises and walks everyday for exercise. She denies unusual headaches, visual changes, nausea, vomiting, or dizziness. There has been no unusual cough, phlegm production, or pleurisy. This been no change in bowel or bladder habits. She denies unexplained fatigue or unexplained weight loss, bleeding, rash, or fever. A detailed review of systems was otherwise stable.  PAST MEDICAL HISTORY: Past Medical History:  Diagnosis Date  . Cancer [facility])    breast  . Genetic testing 02/19/2017   STAT Breast panel with reflext to Multi-Cancer panel (83 genes) @ [facility] - No pathogenic mutations detected  . GERD (gastroesophageal reflux disease)    no meds  . Seasonal allergies   . SVD (spontaneous vaginal delivery)    x 1    PAST SURGICAL HISTORY: Past Surgical History:  Procedure Laterality Date  . BILATERAL SALPINGECTOMY Bilateral 06/23/2012   Procedure: BILATERAL SALPINGECTOMY;  Surgeon: Cheri Fowler, MD;  Location: [facility];  Service: Gynecology;  Laterality: Bilateral;  . BREAST LUMPECTOMY WITH RADIOACTIVE SEED AND SENTINEL LYMPH NODE BIOPSY Right 03/23/2017   Procedure: BREAST LUMPECTOMY WITH RADIOACTIVE SEED AND SENTINEL LYMPH NODE BIOPSY;  Surgeon: Rolm Bookbinder, MD;  Location: [facility];  Service: General;  Laterality: Right;  . LAPAROSCOPIC SUPRACERVICAL HYSTERECTOMY N/A 06/23/2012   Procedure: LAPAROSCOPIC SUPRACERVICAL HYSTERECTOMY;  Surgeon: Cheri Fowler, MD;  Location: [facility];  Service: Gynecology;  Laterality: N/A;  . PORTACATH PLACEMENT Right 03/23/2017   Procedure: INSERTION PORT-A-CATH;  Surgeon: Rolm Bookbinder, MD;  Location: [facility];  Service: General;  Laterality: Right;  . TUBAL LIGATION    . VULVAR LESION REMOVAL N/A 11/01/2014    Procedure: MONS PUBIS LESION;  Surgeon: Cheri Fowler, MD;  Location: [facility];  Service: Gynecology;  Laterality: N/A;  local anesthesia with IV sedation if needed  . WISDOM TOOTH EXTRACTION      FAMILY HISTORY No family history on file.The patient has no information regarding her father or his side of the family.  The patient's mother is 56 years old as of November 2018.  The patient has 3 brothers, 2 sisters.  One sister had 2 "benign tumors" removed from the right breast at the age of 74.  A maternal grandfather had prostate cancer in his 61s.  GYNECOLOGIC HISTORY:  Patient's last menstrual period was 05/26/2012. Menarche age 68, first live birth age 89, the patient is GX P1.  She stopped having periods 2012 when she underwent hysterectomy, without salpingo-oophorectomy..  She used hormone replacement for approximately 3 months.  She is used oral contraceptives remotely with no complications.  SOCIAL HISTORY:  Kelsey Henry works as an Psychologist, educational for [employer], in the paternal DNA subsection.  Her husband Kelsey Henry is an Clinical biochemist.  Their son Kelsey Henry is an Producer, television/film/video and lives in [place].  The patient has no grandchildren.  She is a Psychologist, forensic.    ADVANCED DIRECTIVES: Not in place   HEALTH MAINTENANCE: Social History   Tobacco Use  . Smoking status: Never Smoker  . Smokeless tobacco: Never Used  Substance Use Topics  . Alcohol use: No  . Drug use: No     Colonoscopy: n/a  PAP: Status post hysterectomy  Bone density: n/a   Allergies  Allergen Reactions  . Aspirin Hives    Current Outpatient Medications  Medication Sig Dispense Refill  . docusate sodium (STOOL SOFTENER) 100 MG capsule Take 100 mg by mouth 2 (two) times daily.    Marland Kitchen loratadine (CLARITIN) 10 MG tablet Take 10 mg by mouth daily as needed for allergies.    . Multiple Vitamins-Minerals (MULTIVITAMIN ADULT) CHEW Chew 2 % by mouth.    Marland Kitchen omeprazole (PRILOSEC) 40 MG capsule TAKE 1 CAPSULE BY MOUTH EVERY DAY 90 capsule 0    . ondansetron (ZOFRAN) 8 MG tablet Take 1 tablet (8 mg total) by mouth every 8 (eight) hours as needed for nausea or vomiting. 20 tablet 0  . prochlorperazine (COMPAZINE) 10 MG tablet Take 1 tablet (10 mg total) by mouth every 6 (six) hours as needed for nausea or vomiting. 30 tablet 0  . vitamin C (ASCORBIC ACID) 250 MG tablet Take 250 mg by mouth daily.     No current facility-administered medications for this visit.     OBJECTIVE: Middle-aged African-American woman who appears well  Vitals:   07/07/17 0916  BP: 117/87  Pulse: 87  Resp: 18  Temp: 98.9 F (37.2 C)  SpO2: 100%     Body mass index is 24.17 kg/m.   Wt Readings from Last 3 Encounters:  07/07/17 127 lb 14.4 oz (58 kg)  06/30/17 127 lb 6.4 oz (57.8 kg)  06/23/17 126 lb 11.2 oz (57.5 kg)      ECOG FS:0 - Asymptomatic  Sclerae unicteric, EOMs intact Oropharynx clear and moist No cervical or supraclavicular adenopathy Lungs no rales or rhonchi Heart regular rate and rhythm Abd soft, nontender, positive bowel sounds MSK no focal spinal tenderness, no upper extremity lymphedema Neuro: nonfocal, well oriented, appropriate affect Breasts: The right breast is status post  lumpectomy.  It is hard to see the scars.  The cosmetic result is excellent.  The left breast is benign.  Both axillae are benign.  LAB RESULTS:  CMP     Component Value Date/Time   NA 140 06/23/2017 0810   NA 142 02/18/2017 1221   K 3.8 06/23/2017 0810   K 4.6 02/18/2017 1221   CL 107 06/23/2017 0810   CO2 26 06/23/2017 0810   CO2 27 02/18/2017 1221   GLUCOSE 89 06/23/2017 0810   GLUCOSE 95 02/18/2017 1221   BUN 9 06/23/2017 0810   BUN 18.4 02/18/2017 1221   CREATININE 0.72 06/23/2017 0810   CREATININE 1.0 02/18/2017 1221   CALCIUM 9.8 06/23/2017 0810   CALCIUM 10.3 02/18/2017 1221   PROT 6.8 06/23/2017 0810   PROT 8.3 02/18/2017 1221   ALBUMIN 3.8 06/23/2017 0810   ALBUMIN 4.4 02/18/2017 1221   AST 16 06/23/2017 0810   AST 22  02/18/2017 1221   ALT 24 06/23/2017 0810   ALT 18 02/18/2017 1221   ALKPHOS 136 06/23/2017 0810   ALKPHOS 89 02/18/2017 1221   BILITOT 0.3 06/23/2017 0810   BILITOT 0.48 02/18/2017 1221   GFRNONAA >60 06/23/2017 0810   GFRAA >60 06/23/2017 0810    No results found for: TOTALPROTELP, ALBUMINELP, A1GS, A2GS, BETS, BETA2SER, GAMS, MSPIKE, SPEI  No results found for: Nils Pyle, [facility]  Lab Results  Component Value Date   WBC 9.6 06/30/2017   NEUTROABS 7.5 (H) 06/30/2017   HGB 13.7 03/23/2017   HCT 32.5 (L) 06/30/2017   MCV 90.4 06/30/2017   PLT 356 06/30/2017      Chemistry      Component Value Date/Time   NA 140 06/23/2017 0810   NA 142 02/18/2017 1221   K 3.8 06/23/2017 0810   K 4.6 02/18/2017 1221   CL 107 06/23/2017 0810   CO2 26 06/23/2017 0810   CO2 27 02/18/2017 1221   BUN 9 06/23/2017 0810   BUN 18.4 02/18/2017 1221   CREATININE 0.72 06/23/2017 0810   CREATININE 1.0 02/18/2017 1221      Component Value Date/Time   CALCIUM 9.8 06/23/2017 0810   CALCIUM 10.3 02/18/2017 1221   ALKPHOS 136 06/23/2017 0810   ALKPHOS 89 02/18/2017 1221   AST 16 06/23/2017 0810   AST 22 02/18/2017 1221   ALT 24 06/23/2017 0810   ALT 18 02/18/2017 1221   BILITOT 0.3 06/23/2017 0810   BILITOT 0.48 02/18/2017 1221       No results found for: LABCA2  No components found for: INOMVE720  No results for input(s): INR in the last 168 hours.  No results found for: LABCA2  No results found for: NOB096  No results found for: GEZ662  No results found for: HUT654  No results found for: CA2729  No components found for: HGQUANT  No results found for: CEA1 / No results found for: CEA1   No results found for: AFPTUMOR  No results found for: CHROMOGRNA  No results found for: PSA1  No visits with results within 3 Day(s) from this visit.  Latest known visit with results is:  Appointment on 06/30/2017  Component Date Value Ref Range Status  . WBC Count  06/30/2017 9.6  3.9 - 10.3 K/uL Final  . RBC 06/30/2017 3.60* 3.70 - 5.45 MIL/uL Final  . Hemoglobin 06/30/2017 11.0* 11.6 - 15.9 g/dL Final  . HCT 06/30/2017 32.5* 34.8 - 46.6 % Final  . MCV 06/30/2017 90.4  79.5 - 101.0  fL Final  . MCH 06/30/2017 30.6  25.1 - 34.0 pg Final  . MCHC 06/30/2017 33.9  31.5 - 36.0 g/dL Final  . RDW 06/30/2017 15.9* 11.2 - 14.5 % Final  . Platelet Count 06/30/2017 356  145 - 400 K/uL Final  . Neutrophils Relative % 06/30/2017 77  % Final  . Neutro Abs 06/30/2017 7.5* 1.5 - 6.5 K/uL Final  . Lymphocytes Relative 06/30/2017 14  % Final  . Lymphs Abs 06/30/2017 1.4  0.9 - 3.3 K/uL Final  . Monocytes Relative 06/30/2017 7  % Final  . Monocytes Absolute 06/30/2017 0.6  0.1 - 0.9 K/uL Final  . Eosinophils Relative 06/30/2017 1  % Final  . Eosinophils Absolute 06/30/2017 0.0  0.0 - 0.5 K/uL Final  . Basophils Relative 06/30/2017 1  % Final  . Basophils Absolute 06/30/2017 0.1  0.0 - 0.1 K/uL Final   Performed at [facility], [place] [address] [place], [place] [address]    (this displays the last labs from the last 3 days)  No results found for: TOTALPROTELP, ALBUMINELP, A1GS, A2GS, BETS, BETA2SER, GAMS, MSPIKE, SPEI (this displays SPEP labs)  No results found for: KPAFRELGTCHN, LAMBDASER, KAPLAMBRATIO (kappa/lambda light chains)  No results found for: HGBA, HGBA2QUANT, HGBFQUANT, HGBSQUAN (Hemoglobinopathy evaluation)   No results found for: LDH  No results found for: IRON, TIBC, IRONPCTSAT (Iron and TIBC)  No results found for: FERRITIN  Urinalysis No results found for: COLORURINE, APPEARANCEUR, LABSPEC, PHURINE, GLUCOSEU, HGBUR, BILIRUBINUR, KETONESUR, PROTEINUR, UROBILINOGEN, NITRITE, LEUKOCYTESUR   STUDIES: No results found.  ELIGIBLE FOR AVAILABLE RESEARCH PROTOCOL: UPBEAT  ASSESSMENT: 51 y.o. [place] woman status post right breast lower inner quadrant biopsy February 09, 2017 for a clinical T1b N0, stage IB  invasive ductal carcinoma, grade 3, triple negative  (1) status post right lumpectomy 03/23/2017 for a pT1b pN0, stage IB invasive ductal carcinoma, grade 3, with negative margins.  (2) adjuvant chemotherapy consisting of cyclophosphamide and doxorubicin in dose dense fashion x4 started 04/28/2017, completed 06/09/2017, to be followed by weekly paclitaxel x12 starting 06/23/2017  (does not qualify for BR003)  (a) echocardiogram 02/25/2017 showed a baseline ejection fraction in the 60-65% range  (b) echocardiogram 06/01/2017 showed an ejection fraction in the 60-65% range  (c) echocardiogram 07/02/2017 showed an ejection fraction in the 60-65% range  (3) adjuvant radiation to follow  (4) genetics testing 02/19/2017 through the STAT Breast panel with reflext to Multi-Cancer panel (83 genes) @ [facility] - No pathogenic mutations detected in ALK, APC, ATM, AXIN2, BAP1, BARD1, BLM, BMPR1A, BRCA1, BRCA2, BRIP1, CASR, CDC73, CDH1, CDK4, CDKN1B, CDKN1C, CDKN2A, CEBPA, CHEK2, CTNNA1, DICER1, DIS3L2, EGFR, EPCAM, FH, FLCN, GATA2, GPC3, GREM1, HOXB13, HRAS, KIT, MAX, MEN1, MET, MITF, MLH1, MSH2, MSH3, MSH6, MUTYH, NBN, NF1, NF2, NTHL1, PALB2, PDGFRA, PHOX2B, PMS2, POLD1, POLE, POT1, PRKAR1A, PTCH1, PTEN, RAD50, RAD51C, RAD51D, RB1, RECQL4, RET, RUNX1, SDHA, SDHAF2, SDHB, SDHC, SDHD, SMAD4, SMARCA4, SMARCB1, SMARCE1, STK11, SUFU, TERC, TERT, TMEM127, TP53, TSC1, TSC2, VHL, WRN, WT1.  (a) Variants of Uncertain Significance in PDGFRA c.3040G>A (p.Ala1014Thr) and SMARCA4 c.2044C>G (p.Leu682Val) noted  PLAN: Kelsey Henry is tolerating her treatment remarkably well and is proceeding to the third of 12 doses of Taxol today.  Her pretreatment steroids have been dropped to 4 mg which is the dose to use from cycles 3 through 12.  Hopefully that will help her not gain too much weight or have significant side effects from the premeds.  She would like to have all her labs drawn through  flush and I have ordered that.  At this time  we are going to start seeing her with every other treatment until she gets to about her seventh or eighth cycle at which point we will start seeing her again with every cycle.  The big concern of course is peripheral neuropathy.  At this point she has absolutely none of those symptoms.  We have reviewed that in detail again today and she knows to let us know if any numbness or tingling develops over her finger pads or toe pads  She will call with any other issues that may develop before the next visit.   Bettina Warn, Virgie Dad, MD  07/07/17 9:40 AM Medical Oncology and Hematology [facility] [address] [place], [place] [address] Tel. [phone]    Fax. [phone]  This document serves as a record of services personally performed by Lurline Del, MD. It was created on his behalf by Sheron Nightingale, a trained medical scribe. The creation of this record is based on the scribe's personal observations and the provider's statements to them.   I have reviewed the above documentation for accuracy and completeness, and I agree with the above.  Marland Kitchen

## 2017-07-02 NOTE — Progress Notes (Signed)
  Echocardiogram 2D Echocardiogram has been performed.  Kelsey Henry 07/02/2017, 8:38 AM

## 2017-07-07 ENCOUNTER — Inpatient Hospital Stay: Payer: BLUE CROSS/BLUE SHIELD

## 2017-07-07 ENCOUNTER — Inpatient Hospital Stay (HOSPITAL_BASED_OUTPATIENT_CLINIC_OR_DEPARTMENT_OTHER): Payer: BLUE CROSS/BLUE SHIELD | Admitting: Oncology

## 2017-07-07 VITALS — BP 117/87 | HR 87 | Temp 98.9°F | Resp 18 | Ht 61.0 in | Wt 127.9 lb

## 2017-07-07 DIAGNOSIS — Z171 Estrogen receptor negative status [ER-]: Principal | ICD-10-CM

## 2017-07-07 DIAGNOSIS — C50311 Malignant neoplasm of lower-inner quadrant of right female breast: Secondary | ICD-10-CM

## 2017-07-07 DIAGNOSIS — D701 Agranulocytosis secondary to cancer chemotherapy: Secondary | ICD-10-CM

## 2017-07-07 DIAGNOSIS — T451X5A Adverse effect of antineoplastic and immunosuppressive drugs, initial encounter: Principal | ICD-10-CM

## 2017-07-07 DIAGNOSIS — Z5111 Encounter for antineoplastic chemotherapy: Secondary | ICD-10-CM | POA: Diagnosis not present

## 2017-07-07 LAB — CMP (CANCER CENTER ONLY)
ALBUMIN: 4.1 g/dL (ref 3.5–5.0)
ALK PHOS: 74 U/L (ref 40–150)
ALT: 31 U/L (ref 0–55)
AST: 25 U/L (ref 5–34)
Anion gap: 8 (ref 3–11)
BUN: 13 mg/dL (ref 7–26)
CALCIUM: 10.2 mg/dL (ref 8.4–10.4)
CHLORIDE: 107 mmol/L (ref 98–109)
CO2: 26 mmol/L (ref 22–29)
CREATININE: 0.78 mg/dL (ref 0.60–1.10)
GFR, Estimated: >60 mL/min (ref >60)
GLUCOSE: 89 mg/dL (ref 70–140)
Potassium: 4 mmol/L (ref 3.5–5.1)
Sodium: 141 mmol/L (ref 136–145)
Total Bilirubin: 0.3 mg/dL (ref 0.2–1.2)
Total Protein: 7.1 g/dL (ref 6.4–8.3)

## 2017-07-07 LAB — CBC WITH DIFFERENTIAL (CANCER CENTER ONLY)
BASOS PCT: 1 %
Basophils Absolute: 0.1 10*3/uL (ref 0.0–0.1)
EOS ABS: 0.1 10*3/uL (ref 0.0–0.5)
Eosinophils Relative: 1 %
HCT: 31.9 % — ABNORMAL LOW (ref 34.8–46.6)
Hemoglobin: 10.9 g/dL — ABNORMAL LOW (ref 11.6–15.9)
LYMPHS ABS: 1.1 10*3/uL (ref 0.9–3.3)
Lymphocytes Relative: 14 %
MCH: 31.4 pg (ref 25.1–34.0)
MCHC: 34.3 g/dL (ref 31.5–36.0)
MCV: 91.7 fL (ref 79.5–101.0)
MONO ABS: 0.5 10*3/uL (ref 0.1–0.9)
MONOS PCT: 7 %
NEUTROS PCT: 77 %
Neutro Abs: 5.8 10*3/uL (ref 1.5–6.5)
Platelet Count: 348 10*3/uL (ref 145–400)
RBC: 3.48 MIL/uL — ABNORMAL LOW (ref 3.70–5.45)
RDW: 17 % — AB (ref 11.2–14.5)
WBC Count: 7.4 10*3/uL (ref 3.9–10.3)

## 2017-07-07 MED ORDER — FAMOTIDINE IN NACL 20-0.9 MG/50ML-% IV SOLN
20.0000 mg | Freq: Once | INTRAVENOUS | Status: AC
  Administered 2017-07-07: 20 mg via INTRAVENOUS

## 2017-07-07 MED ORDER — SODIUM CHLORIDE 0.9% FLUSH
10.0000 mL | INTRAVENOUS | Status: DC | PRN
  Administered 2017-07-07: 10 mL
  Filled 2017-07-07: qty 10

## 2017-07-07 MED ORDER — DIPHENHYDRAMINE HCL 50 MG/ML IJ SOLN
25.0000 mg | Freq: Once | INTRAMUSCULAR | Status: AC
  Administered 2017-07-07: 25 mg via INTRAVENOUS

## 2017-07-07 MED ORDER — SODIUM CHLORIDE 0.9 % IV SOLN: 4.0000 mg | Freq: Once | INTRAVENOUS | Status: DC

## 2017-07-07 MED ORDER — DIPHENHYDRAMINE HCL 50 MG/ML IJ SOLN
INTRAMUSCULAR | Status: AC
  Filled 2017-07-07: qty 1

## 2017-07-07 MED ORDER — SODIUM CHLORIDE 0.9 % IV SOLN
Freq: Once | INTRAVENOUS | Status: AC
Start: 2017-07-07 — End: 2017-07-07
  Administered 2017-07-07: 11:00:00 via INTRAVENOUS

## 2017-07-07 MED ORDER — FAMOTIDINE IN NACL 20-0.9 MG/50ML-% IV SOLN
INTRAVENOUS | Status: AC
  Filled 2017-07-07: qty 50

## 2017-07-07 MED ORDER — DEXAMETHASONE SODIUM PHOSPHATE 10 MG/ML IJ SOLN
INTRAMUSCULAR | Status: AC
  Filled 2017-07-07: qty 1

## 2017-07-07 MED ORDER — DEXAMETHASONE SODIUM PHOSPHATE 10 MG/ML IJ SOLN
4.0000 mg | Freq: Once | INTRAMUSCULAR | Status: AC
  Administered 2017-07-07: 4 mg via INTRAVENOUS

## 2017-07-07 MED ORDER — HEPARIN SOD (PORK) LOCK FLUSH 100 UNIT/ML IV SOLN
500.0000 [IU] | Freq: Once | INTRAVENOUS | Status: AC | PRN
  Administered 2017-07-07: 500 [IU]
  Filled 2017-07-07: qty 5

## 2017-07-07 MED ORDER — PACLITAXEL CHEMO INJECTION 300 MG/50ML
80.0000 mg/m2 | Freq: Once | INTRAVENOUS | Status: AC
  Administered 2017-07-07: 126 mg via INTRAVENOUS
  Filled 2017-07-07: qty 21

## 2017-07-07 NOTE — Patient Instructions (Signed)

## 2017-07-07 NOTE — Patient Instructions (Signed)
[facility] Discharge Instructions for Patients Receiving Chemotherapy  Today you received the following chemotherapy agents taxol  To help prevent nausea and vomiting after your treatment, we encourage you to take your nausea medication as directed   If you develop nausea and vomiting that is not controlled by your nausea medication, call the clinic.   BELOW ARE SYMPTOMS THAT SHOULD BE REPORTED IMMEDIATELY:  *FEVER GREATER THAN 100.5 F  *CHILLS WITH OR WITHOUT FEVER  NAUSEA AND VOMITING THAT IS NOT CONTROLLED WITH YOUR NAUSEA MEDICATION  *UNUSUAL SHORTNESS OF BREATH  *UNUSUAL BRUISING OR BLEEDING  TENDERNESS IN MOUTH AND THROAT WITH OR WITHOUT PRESENCE OF ULCERS  *URINARY PROBLEMS  *BOWEL PROBLEMS  UNUSUAL RASH Items with * indicate a potential emergency and should be followed up as soon as possible.  Feel free to call the clinic you have any questions or concerns. The clinic phone number is [phone].  

## 2017-07-14 ENCOUNTER — Inpatient Hospital Stay: Payer: BLUE CROSS/BLUE SHIELD | Attending: Oncology

## 2017-07-14 ENCOUNTER — Inpatient Hospital Stay: Payer: BLUE CROSS/BLUE SHIELD

## 2017-07-14 ENCOUNTER — Other Ambulatory Visit: Payer: Self-pay | Admitting: Oncology

## 2017-07-14 ENCOUNTER — Other Ambulatory Visit: Payer: BLUE CROSS/BLUE SHIELD

## 2017-07-14 VITALS — BP 127/88 | HR 79 | Temp 98.6°F | Resp 17

## 2017-07-14 DIAGNOSIS — Z171 Estrogen receptor negative status [ER-]: Secondary | ICD-10-CM | POA: Diagnosis not present

## 2017-07-14 DIAGNOSIS — Z5111 Encounter for antineoplastic chemotherapy: Secondary | ICD-10-CM | POA: Diagnosis not present

## 2017-07-14 DIAGNOSIS — Z95828 Presence of other vascular implants and grafts: Secondary | ICD-10-CM

## 2017-07-14 DIAGNOSIS — D701 Agranulocytosis secondary to cancer chemotherapy: Secondary | ICD-10-CM

## 2017-07-14 DIAGNOSIS — C50311 Malignant neoplasm of lower-inner quadrant of right female breast: Secondary | ICD-10-CM | POA: Diagnosis not present

## 2017-07-14 DIAGNOSIS — T451X5A Adverse effect of antineoplastic and immunosuppressive drugs, initial encounter: Secondary | ICD-10-CM

## 2017-07-14 DIAGNOSIS — G629 Polyneuropathy, unspecified: Secondary | ICD-10-CM | POA: Diagnosis not present

## 2017-07-14 LAB — CBC WITH DIFFERENTIAL (CANCER CENTER ONLY)
Basophils Absolute: 0.1 10*3/uL (ref 0.0–0.1)
Basophils Relative: 1 %
EOS ABS: 0.1 10*3/uL (ref 0.0–0.5)
Eosinophils Relative: 2 %
HCT: 33.5 % — ABNORMAL LOW (ref 34.8–46.6)
HEMOGLOBIN: 11.2 g/dL — AB (ref 11.6–15.9)
LYMPHS ABS: 1.2 10*3/uL (ref 0.9–3.3)
Lymphocytes Relative: 17 %
MCH: 31.3 pg (ref 25.1–34.0)
MCHC: 33.4 g/dL (ref 31.5–36.0)
MCV: 93.6 fL (ref 79.5–101.0)
MONOS PCT: 5 %
Monocytes Absolute: 0.3 10*3/uL (ref 0.1–0.9)
NEUTROS PCT: 75 %
Neutro Abs: 5.5 10*3/uL (ref 1.5–6.5)
PLATELETS: 317 10*3/uL (ref 145–400)
RBC: 3.58 MIL/uL — ABNORMAL LOW (ref 3.70–5.45)
RDW: 15.7 % — ABNORMAL HIGH (ref 11.2–14.5)
WBC: 7.2 10*3/uL (ref 3.9–10.3)

## 2017-07-14 LAB — CMP (CANCER CENTER ONLY)
ALK PHOS: 70 U/L (ref 40–150)
ALT: 42 U/L (ref 0–55)
AST: 33 U/L (ref 5–34)
Albumin: 4.2 g/dL (ref 3.5–5.0)
Anion gap: 7 (ref 3–11)
BUN: 12 mg/dL (ref 7–26)
CHLORIDE: 107 mmol/L (ref 98–109)
CO2: 25 mmol/L (ref 22–29)
CREATININE: 0.79 mg/dL (ref 0.60–1.10)
Calcium: 10.1 mg/dL (ref 8.4–10.4)
Glucose, Bld: 84 mg/dL (ref 70–140)
Potassium: 3.9 mmol/L (ref 3.5–5.1)
Sodium: 139 mmol/L (ref 136–145)
Total Bilirubin: 0.3 mg/dL (ref 0.2–1.2)
Total Protein: 7.2 g/dL (ref 6.4–8.3)

## 2017-07-14 MED ORDER — SODIUM CHLORIDE 0.9% FLUSH
10.0000 mL | Freq: Once | INTRAVENOUS | Status: AC
  Administered 2017-07-14: 10 mL
  Filled 2017-07-14: qty 10

## 2017-07-14 MED ORDER — HEPARIN SOD (PORK) LOCK FLUSH 100 UNIT/ML IV SOLN
500.0000 [IU] | Freq: Once | INTRAVENOUS | Status: AC | PRN
  Administered 2017-07-14: 500 [IU]
  Filled 2017-07-14: qty 5

## 2017-07-14 MED ORDER — DEXAMETHASONE SODIUM PHOSPHATE 10 MG/ML IJ SOLN
INTRAMUSCULAR | Status: AC
  Filled 2017-07-14: qty 1

## 2017-07-14 MED ORDER — FAMOTIDINE IN NACL 20-0.9 MG/50ML-% IV SOLN
20.0000 mg | Freq: Once | INTRAVENOUS | Status: AC
  Administered 2017-07-14: 20 mg via INTRAVENOUS

## 2017-07-14 MED ORDER — FAMOTIDINE IN NACL 20-0.9 MG/50ML-% IV SOLN
INTRAVENOUS | Status: AC
  Filled 2017-07-14: qty 50

## 2017-07-14 MED ORDER — DIPHENHYDRAMINE HCL 50 MG/ML IJ SOLN
25.0000 mg | Freq: Once | INTRAMUSCULAR | Status: AC
  Administered 2017-07-14: 25 mg via INTRAVENOUS

## 2017-07-14 MED ORDER — SODIUM CHLORIDE 0.9 % IV SOLN
Freq: Once | INTRAVENOUS | Status: AC
  Administered 2017-07-14: 13:00:00 via INTRAVENOUS

## 2017-07-14 MED ORDER — SODIUM CHLORIDE 0.9% FLUSH
10.0000 mL | INTRAVENOUS | Status: DC | PRN
  Administered 2017-07-14: 10 mL
  Filled 2017-07-14: qty 10

## 2017-07-14 MED ORDER — SODIUM CHLORIDE 0.9 % IV SOLN
80.0000 mg/m2 | Freq: Once | INTRAVENOUS | Status: AC
  Administered 2017-07-14: 126 mg via INTRAVENOUS
  Filled 2017-07-14: qty 21

## 2017-07-14 MED ORDER — DEXAMETHASONE SODIUM PHOSPHATE 10 MG/ML IJ SOLN
4.0000 mg | Freq: Once | INTRAMUSCULAR | Status: AC
  Administered 2017-07-14: 4 mg via INTRAVENOUS

## 2017-07-14 MED ORDER — DIPHENHYDRAMINE HCL 50 MG/ML IJ SOLN
INTRAMUSCULAR | Status: AC
  Filled 2017-07-14: qty 1

## 2017-07-14 NOTE — Patient Instructions (Signed)
[facility] Discharge Instructions for Patients Receiving Chemotherapy  Today you received the following chemotherapy agents taxol  To help prevent nausea and vomiting after your treatment, we encourage you to take your nausea medication as directed   If you develop nausea and vomiting that is not controlled by your nausea medication, call the clinic.   BELOW ARE SYMPTOMS THAT SHOULD BE REPORTED IMMEDIATELY:  *FEVER GREATER THAN 100.5 F  *CHILLS WITH OR WITHOUT FEVER  NAUSEA AND VOMITING THAT IS NOT CONTROLLED WITH YOUR NAUSEA MEDICATION  *UNUSUAL SHORTNESS OF BREATH  *UNUSUAL BRUISING OR BLEEDING  TENDERNESS IN MOUTH AND THROAT WITH OR WITHOUT PRESENCE OF ULCERS  *URINARY PROBLEMS  *BOWEL PROBLEMS  UNUSUAL RASH Items with * indicate a potential emergency and should be followed up as soon as possible.  Feel free to call the clinic you have any questions or concerns. The clinic phone number is [phone].  

## 2017-07-20 ENCOUNTER — Encounter: Payer: Self-pay | Admitting: Oncology

## 2017-07-20 NOTE — Progress Notes (Signed)
Faxed "attending provider statement" to [facility] @ [phone], confirmation received, copy will be mailed to the patient.

## 2017-07-21 ENCOUNTER — Inpatient Hospital Stay: Payer: BLUE CROSS/BLUE SHIELD

## 2017-07-21 ENCOUNTER — Encounter: Payer: Self-pay | Admitting: *Deleted

## 2017-07-21 ENCOUNTER — Other Ambulatory Visit: Payer: BLUE CROSS/BLUE SHIELD

## 2017-07-21 ENCOUNTER — Encounter: Payer: Self-pay | Admitting: Adult Health

## 2017-07-21 ENCOUNTER — Inpatient Hospital Stay (HOSPITAL_BASED_OUTPATIENT_CLINIC_OR_DEPARTMENT_OTHER): Payer: BLUE CROSS/BLUE SHIELD | Admitting: Adult Health

## 2017-07-21 VITALS — BP 138/78 | HR 99 | Temp 99.0°F | Resp 18 | Ht 61.0 in | Wt 127.9 lb

## 2017-07-21 DIAGNOSIS — G620 Drug-induced polyneuropathy: Secondary | ICD-10-CM | POA: Diagnosis not present

## 2017-07-21 DIAGNOSIS — C50311 Malignant neoplasm of lower-inner quadrant of right female breast: Secondary | ICD-10-CM

## 2017-07-21 DIAGNOSIS — Z5111 Encounter for antineoplastic chemotherapy: Secondary | ICD-10-CM | POA: Diagnosis not present

## 2017-07-21 DIAGNOSIS — Z171 Estrogen receptor negative status [ER-]: Secondary | ICD-10-CM

## 2017-07-21 DIAGNOSIS — G629 Polyneuropathy, unspecified: Secondary | ICD-10-CM | POA: Diagnosis not present

## 2017-07-21 DIAGNOSIS — Z95828 Presence of other vascular implants and grafts: Secondary | ICD-10-CM

## 2017-07-21 LAB — CMP (CANCER CENTER ONLY)
ALK PHOS: 75 U/L (ref 40–150)
ALT: 25 U/L (ref 0–55)
AST: 22 U/L (ref 5–34)
Albumin: 4.3 g/dL (ref 3.5–5.0)
Anion gap: 10 (ref 3–11)
BUN: 11 mg/dL (ref 7–26)
CALCIUM: 10.3 mg/dL (ref 8.4–10.4)
CHLORIDE: 105 mmol/L (ref 98–109)
CO2: 25 mmol/L (ref 22–29)
CREATININE: 0.85 mg/dL (ref 0.60–1.10)
GFR, Est AFR Am: >60 mL/min (ref >60)
Glucose, Bld: 101 mg/dL (ref 70–140)
Potassium: 4.1 mmol/L (ref 3.5–5.1)
Sodium: 140 mmol/L (ref 136–145)
Total Bilirubin: 0.3 mg/dL (ref 0.2–1.2)
Total Protein: 7.5 g/dL (ref 6.4–8.3)

## 2017-07-21 LAB — CBC WITH DIFFERENTIAL (CANCER CENTER ONLY)
BASOS ABS: 0.1 10*3/uL (ref 0.0–0.1)
Basophils Relative: 1 %
Eosinophils Absolute: 0.1 10*3/uL (ref 0.0–0.5)
Eosinophils Relative: 2 %
HEMATOCRIT: 34.1 % — AB (ref 34.8–46.6)
Hemoglobin: 11.4 g/dL — ABNORMAL LOW (ref 11.6–15.9)
LYMPHS ABS: 1 10*3/uL (ref 0.9–3.3)
LYMPHS PCT: 16 %
MCH: 31.5 pg (ref 25.1–34.0)
MCHC: 33.4 g/dL (ref 31.5–36.0)
MCV: 94.2 fL (ref 79.5–101.0)
MONO ABS: 0.3 10*3/uL (ref 0.1–0.9)
Monocytes Relative: 4 %
Neutro Abs: 4.6 10*3/uL (ref 1.5–6.5)
Neutrophils Relative %: 77 %
Platelet Count: 298 10*3/uL (ref 145–400)
RBC: 3.62 MIL/uL — AB (ref 3.70–5.45)
RDW: 15.3 % — AB (ref 11.2–14.5)
WBC Count: 6 10*3/uL (ref 3.9–10.3)

## 2017-07-21 MED ORDER — SODIUM CHLORIDE 0.9% FLUSH
10.0000 mL | INTRAVENOUS | Status: DC | PRN
  Administered 2017-07-21: 10 mL via INTRAVENOUS
  Filled 2017-07-21: qty 10

## 2017-07-21 NOTE — Progress Notes (Addendum)
[place]  Telephone:([phone]) [phone] Fax:(336) [phone]     ID: Kelsey Henry DOB: 1966/04/10  MR#: [id number]  CSN#:[id number]  Patient Care Team: Patient, No Pcp Per as PCP - General (General Practice) Magrinat, Virgie Dad, MD as Consulting Physician (Oncology) Kyung Rudd, MD as Consulting Physician (Radiation Oncology) Rolm Bookbinder, MD as Consulting Physician (General Surgery) Meisinger, Sherren Mocha, MD as Consulting Physician (Obstetrics and Gynecology) Loletta Specter, MD (Unknown Physician Specialty) OTHER MD:  CHIEF COMPLAINT: triple negative breast cancer  CURRENT TREATMENT: adjuvant chemotherapy   HISTORY OF CURRENT ILLNESS: From the original intake note:  Kelsey Henry had routine screening mammography October 2018 showing a possible right breast mass.  She was scheduled for unilateral right mammography and tomography with right breast ultrasonography at the breast center February 06, 2017.  This showed the breast density to be category D.  In the lower inner quadrant of the right breast there was a 0.6 cm oval mass, which by ultrasound measured 0.9 cm and was irregular and hypoechoic.  Ultrasound of the right axilla was sonographically benign.  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  The patient's subsequent history is as detailed below.  INTERVAL HISTORY: Kelsey Henry returns today for follow-up and treatment of her triple negative breast cancer, accompanied by her husband. Today is day 1 cycle 5 of weekly paclitaxel x12.   Her most recent echocardiogram 07/02/2017 showed an ejection fraction in the 60-65% range.   REVIEW OF SYSTEMS: Kelsey Henry is doing well today.  She is experiencing mild intermittent peripheral neuropathy.   Saturday she experienced numbness in her arm as well.  She is experiencing it very mildly in her finger tips and currently feels the numbness in her fingertips.  It started back this morning.  No motor deficits at this point.    Kelsey Henry denies any other issues such as fevers, chills, nausea, vomiting, constipation, diarrhea, watery eyes, vision changes, headaches, chest pain, palpitations, new pain, shortness of breath, cough, or any other concerns.    PAST MEDICAL HISTORY: Past Medical History:  Diagnosis Date  . Cancer [facility])    breast  . Genetic testing 02/19/2017   STAT Breast panel with reflext to Multi-Cancer panel (83 genes) @ [facility] - No pathogenic mutations detected  . GERD (gastroesophageal reflux disease)    no meds  . Seasonal allergies   . SVD (spontaneous vaginal delivery)    x 1    PAST SURGICAL HISTORY: Past Surgical History:  Procedure Laterality Date  . BILATERAL SALPINGECTOMY Bilateral 06/23/2012   Procedure: BILATERAL SALPINGECTOMY;  Surgeon: Cheri Fowler, MD;  Location: [facility];  Service: Gynecology;  Laterality: Bilateral;  . BREAST LUMPECTOMY WITH RADIOACTIVE SEED AND SENTINEL LYMPH NODE BIOPSY Right 03/23/2017   Procedure: BREAST LUMPECTOMY WITH RADIOACTIVE SEED AND SENTINEL LYMPH NODE BIOPSY;  Surgeon: Rolm Bookbinder, MD;  Location: [place];  Service: General;  Laterality: Right;  . LAPAROSCOPIC SUPRACERVICAL HYSTERECTOMY N/A 06/23/2012   Procedure: LAPAROSCOPIC SUPRACERVICAL HYSTERECTOMY;  Surgeon: Cheri Fowler, MD;  Location: [facility];  Service: Gynecology;  Laterality: N/A;  . PORTACATH PLACEMENT Right 03/23/2017   Procedure: INSERTION PORT-A-CATH;  Surgeon: Rolm Bookbinder, MD;  Location: [place];  Service: General;  Laterality: Right;  . TUBAL LIGATION    . VULVAR LESION REMOVAL N/A 11/01/2014  Procedure: MONS PUBIS LESION;  Surgeon: Cheri Fowler, MD;  Location: [facility];  Service: Gynecology;  Laterality: N/A;  local anesthesia with IV sedation if needed    . WISDOM TOOTH EXTRACTION      FAMILY HISTORY No family history on file.The patient has no information regarding her father or his side of the family.  The patient's mother is 53 years old as of November 2018.  The patient has 3 brothers, 2 sisters.  One sister had 2 "benign tumors" removed from the right breast at the age of 27.  A maternal grandfather had prostate cancer in his 70s.  GYNECOLOGIC HISTORY:  Patient's last menstrual period was 05/26/2012. Menarche age 35, first live birth age 31, the patient is GX P1.  She stopped having periods 2012 when she underwent hysterectomy, without salpingo-oophorectomy..  She used hormone replacement for approximately 3 months.  She is used oral contraceptives remotely with no complications.  SOCIAL HISTORY:  Kelsey Henry works as an Psychologist, educational for [employer], in the paternal DNA subsection.  Her husband Kelsey Henry is an Clinical biochemist.  Their son [name] is an Producer, television/film/video and lives in [place].  The patient has no grandchildren.  She is a Psychologist, forensic.    ADVANCED DIRECTIVES: Not in place   HEALTH MAINTENANCE: Social History   Tobacco Use  . Smoking status: Never Smoker  . Smokeless tobacco: Never Used  Substance Use Topics  . Alcohol use: No  . Drug use: No     Colonoscopy: n/a  PAP: Status post hysterectomy  Bone density: n/a   Allergies  Allergen Reactions  . Aspirin Hives    Current Outpatient Medications  Medication Sig Dispense Refill  . docusate sodium (STOOL SOFTENER) 100 MG capsule Take 100 mg by mouth 2 (two) times daily.    Marland Kitchen loratadine (CLARITIN) 10 MG tablet Take 10 mg by mouth daily as needed for allergies.    . Multiple Vitamins-Minerals (MULTIVITAMIN ADULT) CHEW Chew 2 % by mouth.    Marland Kitchen omeprazole (PRILOSEC) 40 MG capsule TAKE 1 CAPSULE BY MOUTH EVERY DAY 90 capsule 0  . ondansetron (ZOFRAN) 8 MG tablet Take 1 tablet (8 mg total) by mouth every 8 (eight) hours as needed for nausea or vomiting. 20 tablet 0  . prochlorperazine  (COMPAZINE) 10 MG tablet Take 1 tablet (10 mg total) by mouth every 6 (six) hours as needed for nausea or vomiting. 30 tablet 0  . vitamin C (ASCORBIC ACID) 250 MG tablet Take 250 mg by mouth daily.     No current facility-administered medications for this visit.     OBJECTIVE:   Vitals:   07/21/17 1055  BP: (!) 142/103  Pulse: 99  Resp: 18  Temp: 99 F (37.2 C)  SpO2: 100%     Body mass index is 24.17 kg/m.   Wt Readings from Last 3 Encounters:  07/21/17 127 lb 14.4 oz (58 kg)  07/07/17 127 lb 14.4 oz (58 kg)  06/30/17 127 lb 6.4 oz (57.8 kg)  ECOG FS:1 GENERAL: Patient is a well appearing female in no acute distress HEENT:  Sclerae anicteric.  Oropharynx clear and moist. No ulcerations or evidence of oropharyngeal candidiasis. Neck is supple.  NODES:  No cervical, supraclavicular, or axillary lymphadenopathy palpated.  BREAST EXAM:  Deferred. LUNGS:  Clear to auscultation bilaterally.  No wheezes or rhonchi. HEART:  Regular rate and rhythm. No murmur appreciated. ABDOMEN:  Soft, nontender.  Positive, normoactive bowel sounds. No organomegaly palpated. MSK:  No focal spinal tenderness to  palpation. Full range of motion bilaterally in the upper extremities. EXTREMITIES:  No peripheral edema.   SKIN:  Clear with no obvious rashes or skin changes. No nail dyscrasia. NEURO:  Nonfocal. Well oriented.  Appropriate affect.    LAB RESULTS:  CMP     Component Value Date/Time   NA 139 07/14/2017 1120   NA 142 02/18/2017 1221   K 3.9 07/14/2017 1120   K 4.6 02/18/2017 1221   CL 107 07/14/2017 1120   CO2 25 07/14/2017 1120   CO2 27 02/18/2017 1221   GLUCOSE 84 07/14/2017 1120   GLUCOSE 95 02/18/2017 1221   BUN 12 07/14/2017 1120   BUN 18.4 02/18/2017 1221   CREATININE 0.79 07/14/2017 1120   CREATININE 1.0 02/18/2017 1221   CALCIUM 10.1 07/14/2017 1120   CALCIUM 10.3 02/18/2017 1221   PROT 7.2 07/14/2017 1120   PROT 8.3 02/18/2017 1221   ALBUMIN 4.2 07/14/2017 1120    ALBUMIN 4.4 02/18/2017 1221   AST 33 07/14/2017 1120   AST 22 02/18/2017 1221   ALT 42 07/14/2017 1120   ALT 18 02/18/2017 1221   ALKPHOS 70 07/14/2017 1120   ALKPHOS 89 02/18/2017 1221   BILITOT 0.3 07/14/2017 1120   BILITOT 0.48 02/18/2017 1221   GFRNONAA >60 07/14/2017 1120   GFRAA >60 07/14/2017 1120    No results found for: TOTALPROTELP, ALBUMINELP, A1GS, A2GS, BETS, BETA2SER, GAMS, MSPIKE, SPEI  No results found for: KPAFRELGTCHN, LAMBDASER, [facility]  Lab Results  Component Value Date   WBC 6.0 07/21/2017   NEUTROABS 4.6 07/21/2017   HGB 13.7 03/23/2017   HCT 34.1 (L) 07/21/2017   MCV 94.2 07/21/2017   PLT 298 07/21/2017      Chemistry      Component Value Date/Time   NA 139 07/14/2017 1120   NA 142 02/18/2017 1221   K 3.9 07/14/2017 1120   K 4.6 02/18/2017 1221   CL 107 07/14/2017 1120   CO2 25 07/14/2017 1120   CO2 27 02/18/2017 1221   BUN 12 07/14/2017 1120   BUN 18.4 02/18/2017 1221   CREATININE 0.79 07/14/2017 1120   CREATININE 1.0 02/18/2017 1221      Component Value Date/Time   CALCIUM 10.1 07/14/2017 1120   CALCIUM 10.3 02/18/2017 1221   ALKPHOS 70 07/14/2017 1120   ALKPHOS 89 02/18/2017 1221   AST 33 07/14/2017 1120   AST 22 02/18/2017 1221   ALT 42 07/14/2017 1120   ALT 18 02/18/2017 1221   BILITOT 0.3 07/14/2017 1120   BILITOT 0.48 02/18/2017 1221       No results found for: LABCA2  No components found for: HMCNOB096  No results for input(s): INR in the last 168 hours.  No results found for: LABCA2  No results found for: GEZ662  No results found for: HUT654  No results found for: YTK354  No results found for: CA2729  No components found for: HGQUANT  No results found for: CEA1 / No results found for: CEA1   No results found for: AFPTUMOR  No results found for: CHROMOGRNA  No results found for: PSA1  Appointment on 07/21/2017  Component Date Value Ref Range Status  . WBC Count 07/21/2017 6.0  3.9 - 10.3 K/uL  Final  . RBC 07/21/2017 3.62* 3.70 - 5.45 MIL/uL Final  . Hemoglobin 07/21/2017 11.4* 11.6 - 15.9 g/dL Final  . HCT 07/21/2017 34.1* 34.8 - 46.6 % Final  . MCV 07/21/2017 94.2  79.5 - 101.0 fL Final  . MCH  07/21/2017 31.5  25.1 - 34.0 pg Final  . MCHC 07/21/2017 33.4  31.5 - 36.0 g/dL Final  . RDW 07/21/2017 15.3* 11.2 - 14.5 % Final  . Platelet Count 07/21/2017 298  145 - 400 K/uL Final  . Neutrophils Relative % 07/21/2017 77  % Final  . Neutro Abs 07/21/2017 4.6  1.5 - 6.5 K/uL Final  . Lymphocytes Relative 07/21/2017 16  % Final  . Lymphs Abs 07/21/2017 1.0  0.9 - 3.3 K/uL Final  . Monocytes Relative 07/21/2017 4  % Final  . Monocytes Absolute 07/21/2017 0.3  0.1 - 0.9 K/uL Final  . Eosinophils Relative 07/21/2017 2  % Final  . Eosinophils Absolute 07/21/2017 0.1  0.0 - 0.5 K/uL Final  . Basophils Relative 07/21/2017 1  % Final  . Basophils Absolute 07/21/2017 0.1  0.0 - 0.1 K/uL Final   Performed at [facility], [place] [address], [place], [place] [address]    (this displays the last labs from the last 3 days)  No results found for: TOTALPROTELP, ALBUMINELP, A1GS, A2GS, BETS, BETA2SER, GAMS, MSPIKE, SPEI (this displays SPEP labs)  No results found for: KPAFRELGTCHN, LAMBDASER, KAPLAMBRATIO (kappa/lambda light chains)  No results found for: HGBA, HGBA2QUANT, HGBFQUANT, HGBSQUAN (Hemoglobinopathy evaluation)   No results found for: LDH  No results found for: IRON, TIBC, IRONPCTSAT (Iron and TIBC)  No results found for: FERRITIN  Urinalysis No results found for: COLORURINE, APPEARANCEUR, LABSPEC, PHURINE, GLUCOSEU, HGBUR, BILIRUBINUR, KETONESUR, PROTEINUR, UROBILINOGEN, NITRITE, LEUKOCYTESUR   STUDIES: No results found.  ELIGIBLE FOR AVAILABLE RESEARCH PROTOCOL: UPBEAT  ASSESSMENT: 52 y.o. [place] woman status post right breast lower inner quadrant biopsy February 09, 2017 for a clinical T1b N0, stage IB invasive ductal carcinoma, grade 3,  triple negative  (1) status post right lumpectomy 03/23/2017 for a pT1b pN0, stage IB invasive ductal carcinoma, grade 3, with negative margins.  (2) adjuvant chemotherapy consisting of cyclophosphamide and doxorubicin in dose dense fashion x4 started 04/28/2017, completed 06/09/2017, to be followed by weekly paclitaxel x12 starting 06/23/2017  (does not qualify for BR003)  (a) echocardiogram 02/25/2017 showed a baseline ejection fraction in the 60-65% range  (b) echocardiogram 06/01/2017 showed an ejection fraction in the 60-65% range  (c) echocardiogram 07/02/2017 showed an ejection fraction in the 60-65% range  (3) adjuvant radiation to follow  (4) genetics testing 02/19/2017 through the STAT Breast panel with reflext to Multi-Cancer panel (83 genes) @ [facility] - No pathogenic mutations detected in ALK, APC, ATM, AXIN2, BAP1, BARD1, BLM, BMPR1A, BRCA1, BRCA2, BRIP1, CASR, CDC73, CDH1, CDK4, CDKN1B, CDKN1C, CDKN2A, CEBPA, CHEK2, CTNNA1, DICER1, DIS3L2, EGFR, EPCAM, FH, FLCN, GATA2, GPC3, GREM1, HOXB13, HRAS, KIT, MAX, MEN1, MET, MITF, MLH1, MSH2, MSH3, MSH6, MUTYH, NBN, NF1, NF2, NTHL1, PALB2, PDGFRA, PHOX2B, PMS2, POLD1, POLE, POT1, PRKAR1A, PTCH1, PTEN, RAD50, RAD51C, RAD51D, RB1, RECQL4, RET, RUNX1, SDHA, SDHAF2, SDHB, SDHC, SDHD, SMAD4, SMARCA4, SMARCB1, SMARCE1, STK11, SUFU, TERC, TERT, TMEM127, TP53, TSC1, TSC2, VHL, WRN, WT1.  (a) Variants of Uncertain Significance in PDGFRA c.3040G>A (p.Ala1014Thr) and SMARCA4 c.2044C>G (p.Leu682Val) noted  PLAN: Aleya is doing moderately well today.  Her labs remain stable and I reviewed them with her in detail.  I reviewed her peripheral neuropathy with Dr. Jana Hakim in detail.  She will not receive chemotherapy today. I prepared her that if her neuropathy worsens, or continues she may not receive any further chemotherapy.  She will see Dr. Jana Hakim about this next week to review her neuropathy and if she can continue on  treatment.    She will call with  any other issues that may develop before the next visit.  A total of (30) minutes of face-to-face time was spent with this patient with greater than 50% of that time in counseling and care-coordination.   Wilber Bihari NP  07/21/17 11:19 AM Medical Oncology and Hematology [facility] [address] [place], [place] [address] Tel. [phone]    Fax. [phone]   ADDENDUM: Debra is beginning to experience peripheral neuropathy.  Her descriptions are not classic, but they are worrisome.  The overall benefit of full course of paclitaxel is a risk reduction in the 3% range.  While this is certainly desirable, it is not worth developing permanent disabling neuropathy to obtain it  Accordingly we are holding treatment today and reassessing next week.  He also has a good understanding of this plan and agrees with it.  I personally saw this patient and performed a substantive portion of this encounter with the listed APP documented above.   Chauncey Cruel, MD Medical Oncology and Hematology [facility] [address] [place], [place] [address] Tel. [phone]    Fax. [phone]

## 2017-07-26 NOTE — Addendum Note (Signed)
Addended by: Chauncey Cruel on: 07/26/2017 01:40 PM   Modules accepted: Level of Service

## 2017-07-27 NOTE — Progress Notes (Signed)
Kelsey Henry  Telephone:(336) [phone] Fax:(336) [phone]     ID: Kelsey Henry DOB: [birth year]  MR#: [id number]  DXA#:[id number]  Patient Care Team: Patient, No Pcp Per as PCP - General (General Practice) Rachele Lamaster, Virgie Dad, MD as Consulting Physician (Oncology) Kyung Rudd, MD as Consulting Physician (Radiation Oncology) Rolm Bookbinder, MD as Consulting Physician (General Surgery) Meisinger, Sherren Mocha, MD as Consulting Physician (Obstetrics and Gynecology) Loletta Specter, MD (Unknown Physician Specialty) OTHER MD:  CHIEF COMPLAINT: triple negative breast cancer  CURRENT TREATMENT: Adjuvant radiation pending   HISTORY OF CURRENT ILLNESS: From the original intake note:  Kelsey Henry had routine screening mammography October 2018 showing a possible right breast mass.  She was scheduled for unilateral right mammography and tomography with right breast ultrasonography at the breast center February 06, 2017.  This showed the breast density to be category D.  In the lower inner quadrant of the right breast there was a 0.6 cm oval mass, which by ultrasound measured 0.9 cm and was irregular and hypoechoic.  Ultrasound of the right axilla was sonographically benign.  On February 09, 2017 she underwent biopsy of the right breast mass in question, and this showed (CO-[facility] [id number]) invasive [ductal] carcinoma, grade 3, estrogen and progesterone receptor negative, HER-2/neu not amplified, with a sickness ratio of 1.23 and the number per cell 2.9.  The patient's subsequent history is as detailed below.  INTERVAL HISTORY: Kelsey Henry returns today for follow-up and treatment of her triple negative breast cancer, accompanied by her husband. Today would be day 1 cycle 5 of weekly paclitaxel x12. During cycle 4, she felt neuropathy up her arm.  A few days later,  she felt tingling in the palms of her hands. She may feel some in her feet, but only for a few seconds. She also has some  discoloration in her finger nails.  Accordingly her treatment was held next week and she is being reevaluated today with a view to either continuing or discontinuing her chemotherapy  REVIEW OF SYSTEMS: Kelsey Henry reports that she is not working. She does cardio exercises at least 4-5 days per week. She has a rash on the left ear lobe. She feels some pain and discomfort under the right breast area.  She denies unusual headaches, visual changes, nausea, vomiting, or dizziness. There has been no unusual cough, phlegm production, or pleurisy. This been no change in bowel or bladder habits. She denies unexplained fatigue or unexplained weight loss, bleeding, rash, or fever. A detailed review of systems was otherwise stable.    PAST MEDICAL HISTORY: Past Medical History:  Diagnosis Date  . Cancer [facility])    breast  . Genetic testing 02/19/2017   STAT Breast panel with reflext to Multi-Cancer panel (83 genes) @ [facility] - No pathogenic mutations detected  . GERD (gastroesophageal reflux disease)    no meds  . Seasonal allergies   . SVD (spontaneous vaginal delivery)    x 1    PAST SURGICAL HISTORY: Past Surgical History:  Procedure Laterality Date  . BILATERAL SALPINGECTOMY Bilateral 06/23/2012   Procedure: BILATERAL SALPINGECTOMY;  Surgeon: Cheri Fowler, MD;  Location: [facility];  Service: Gynecology;  Laterality: Bilateral;  . BREAST LUMPECTOMY WITH RADIOACTIVE SEED AND SENTINEL LYMPH NODE BIOPSY Right 03/23/2017   Procedure: BREAST LUMPECTOMY WITH RADIOACTIVE SEED AND SENTINEL LYMPH NODE BIOPSY;  Surgeon: Rolm Bookbinder, MD;  Location: [place];  Service: General;  Laterality: Right;  . LAPAROSCOPIC SUPRACERVICAL HYSTERECTOMY N/A 06/23/2012   Procedure: LAPAROSCOPIC SUPRACERVICAL HYSTERECTOMY;  Surgeon:  Cheri Fowler, MD;  Location: [facility];  Service: Gynecology;  Laterality: N/A;  . PORTACATH PLACEMENT Right 03/23/2017   Procedure: INSERTION PORT-A-CATH;  Surgeon: Rolm Bookbinder, MD;  Location:  [place];  Service: General;  Laterality: Right;  . TUBAL LIGATION    . VULVAR LESION REMOVAL N/A 11/01/2014   Procedure: MONS PUBIS LESION;  Surgeon: Cheri Fowler, MD;  Location: [facility];  Service: Gynecology;  Laterality: N/A;  local anesthesia with IV sedation if needed  . WISDOM TOOTH EXTRACTION      FAMILY HISTORY No family history on file.The patient has no information regarding her father or his side of the family.  The patient's mother is 61 years old as of November 2018.  The patient has 3 brothers, 2 sisters.  One sister had 2 "benign tumors" removed from the right breast at the age of 98.  A maternal grandfather had prostate cancer in his 53s.  GYNECOLOGIC HISTORY:  Patient's last menstrual period was 05/26/2012. Menarche age 84, first live birth age 81, the patient is GX P1.  She stopped having periods 2012 when she underwent hysterectomy, without salpingo-oophorectomy..  She used hormone replacement for approximately 3 months.  She is used oral contraceptives remotely with no complications.  SOCIAL HISTORY:  Kelsey Henry works as an Psychologist, educational for [employer], in the paternal DNA subsection.  Her husband Kelsey Henry is an Clinical biochemist.  Their son Kelsey Henry is an Producer, television/film/video and lives in [place].  The patient has no grandchildren.  She is a Psychologist, forensic.    ADVANCED DIRECTIVES: Not in place   HEALTH MAINTENANCE: Social History   Tobacco Use  . Smoking status: Never Smoker  . Smokeless tobacco: Never Used  Substance Use Topics  . Alcohol use: No  . Drug use: No     Colonoscopy: n/a  PAP: Status post hysterectomy  Bone density: n/a   Allergies  Allergen Reactions  . Aspirin Hives    Current Outpatient Medications  Medication Sig Dispense Refill  . docusate sodium (STOOL SOFTENER) 100 MG capsule Take 100 mg by mouth 2 (two) times daily.    Marland Kitchen loratadine (CLARITIN) 10 MG tablet Take 10 mg by mouth daily as needed for allergies.    . Multiple Vitamins-Minerals (MULTIVITAMIN ADULT) CHEW  Chew 2 % by mouth.    Marland Kitchen omeprazole (PRILOSEC) 40 MG capsule TAKE 1 CAPSULE BY MOUTH EVERY DAY 90 capsule 0  . ondansetron (ZOFRAN) 8 MG tablet Take 1 tablet (8 mg total) by mouth every 8 (eight) hours as needed for nausea or vomiting. 20 tablet 0  . prochlorperazine (COMPAZINE) 10 MG tablet Take 1 tablet (10 mg total) by mouth every 6 (six) hours as needed for nausea or vomiting. 30 tablet 0  . vitamin C (ASCORBIC ACID) 250 MG tablet Take 250 mg by mouth daily.     No current facility-administered medications for this visit.     OBJECTIVE: Middle-aged African-American woman in no acute distress  Vitals:   07/28/17 1143  BP: (!) 144/96  Pulse: 86  Resp: 18  Temp: 98.3 F (36.8 C)  SpO2: 100%     Body mass index is 24.39 kg/m.   Wt Readings from Last 3 Encounters:  07/28/17 129 lb 1.6 oz (58.6 kg)  07/21/17 127 lb 14.4 oz (58 kg)  07/07/17 127 lb 14.4 oz (58 kg)      ECOG FS:1 - Symptomatic but completely ambulatory  Sclerae unicteric, pupils round and equal Oropharynx clear and moist No cervical or supraclavicular adenopathy  Lungs no rales or rhonchi Heart regular rate and rhythm Abd soft, nontender, positive bowel sounds MSK no focal spinal tenderness, no upper extremity lymphedema Neuro: nonfocal, well oriented, appropriate affect Breasts: The right breast has undergone lumpectomy.  The cosmetic result is excellent.  There is no evidence of disease activity.  The left breast is unremarkable.  Both axillae are benign.  LAB RESULTS:  CMP     Component Value Date/Time   NA 141 07/28/2017 1121   NA 142 02/18/2017 1221   K 3.8 07/28/2017 1121   K 4.6 02/18/2017 1221   CL 108 07/28/2017 1121   CO2 26 07/28/2017 1121   CO2 27 02/18/2017 1221   GLUCOSE 83 07/28/2017 1121   GLUCOSE 95 02/18/2017 1221   BUN 9 07/28/2017 1121   BUN 18.4 02/18/2017 1221   CREATININE 0.80 07/28/2017 1121   CREATININE 1.0 02/18/2017 1221   CALCIUM 10.1 07/28/2017 1121   CALCIUM 10.3  02/18/2017 1221   PROT 7.1 07/28/2017 1121   PROT 8.3 02/18/2017 1221   ALBUMIN 4.1 07/28/2017 1121   ALBUMIN 4.4 02/18/2017 1221   AST 21 07/28/2017 1121   AST 22 02/18/2017 1221   ALT 19 07/28/2017 1121   ALT 18 02/18/2017 1221   ALKPHOS 80 07/28/2017 1121   ALKPHOS 89 02/18/2017 1221   BILITOT 0.4 07/28/2017 1121   BILITOT 0.48 02/18/2017 1221   GFRNONAA >60 07/28/2017 1121   GFRAA >60 07/28/2017 1121    No results found for: TOTALPROTELP, ALBUMINELP, A1GS, A2GS, BETS, BETA2SER, GAMS, MSPIKE, SPEI  No results found for: Nils Pyle, [facility]  Lab Results  Component Value Date   WBC 6.3 07/28/2017   NEUTROABS 4.6 07/28/2017   HGB 13.7 03/23/2017   HCT 33.8 (L) 07/28/2017   MCV 91.6 07/28/2017   PLT 306 07/28/2017      Chemistry      Component Value Date/Time   NA 141 07/28/2017 1121   NA 142 02/18/2017 1221   K 3.8 07/28/2017 1121   K 4.6 02/18/2017 1221   CL 108 07/28/2017 1121   CO2 26 07/28/2017 1121   CO2 27 02/18/2017 1221   BUN 9 07/28/2017 1121   BUN 18.4 02/18/2017 1221   CREATININE 0.80 07/28/2017 1121   CREATININE 1.0 02/18/2017 1221      Component Value Date/Time   CALCIUM 10.1 07/28/2017 1121   CALCIUM 10.3 02/18/2017 1221   ALKPHOS 80 07/28/2017 1121   ALKPHOS 89 02/18/2017 1221   AST 21 07/28/2017 1121   AST 22 02/18/2017 1221   ALT 19 07/28/2017 1121   ALT 18 02/18/2017 1221   BILITOT 0.4 07/28/2017 1121   BILITOT 0.48 02/18/2017 1221       No results found for: LABCA2  No components found for: [id number]  No results for input(s): INR in the last 168 hours.  No results found for: LABCA2  No results found for: NKN397  No results found for: QBH419  No results found for: FXT024  No results found for: CA2729  No components found for: HGQUANT  No results found for: CEA1 / No results found for: CEA1   No results found for: AFPTUMOR  No results found for: CHROMOGRNA  No results found for: PSA1  Appointment  on 07/28/2017  Component Date Value Ref Range Status  . Sodium 07/28/2017 141  136 - 145 mmol/L Final  . Potassium 07/28/2017 3.8  3.5 - 5.1 mmol/L Final  . Chloride 07/28/2017 108  98 - 109 mmol/L Final  .  CO2 07/28/2017 26  22 - 29 mmol/L Final  . Glucose, Bld 07/28/2017 83  70 - 140 mg/dL Final  . BUN 07/28/2017 9  7 - 26 mg/dL Final  . Creatinine 07/28/2017 0.80  0.60 - 1.10 mg/dL Final  . Calcium 07/28/2017 10.1  8.4 - 10.4 mg/dL Final  . Total Protein 07/28/2017 7.1  6.4 - 8.3 g/dL Final  . Albumin 07/28/2017 4.1  3.5 - 5.0 g/dL Final  . AST 07/28/2017 21  5 - 34 U/L Final  . ALT 07/28/2017 19  0 - 55 U/L Final  . Alkaline Phosphatase 07/28/2017 80  40 - 150 U/L Final  . Total Bilirubin 07/28/2017 0.4  0.2 - 1.2 mg/dL Final  . GFR, Est Non Af Am 07/28/2017 >60  >60 mL/min Final  . GFR, Est AFR Am 07/28/2017 >60  >60 mL/min Final   Comment: (NOTE) The eGFR has been calculated using the CKD EPI equation. This calculation has not been validated in all clinical situations. eGFR's persistently <60 mL/min signify possible Chronic Kidney Disease.   Georgiann Hahn gap 07/28/2017 7  3 - 11 Final   Performed at [facility], [place] [address], [place], [place] [address]  . WBC Count 07/28/2017 6.3  3.9 - 10.3 K/uL Final  . RBC 07/28/2017 3.69* 3.70 - 5.45 MIL/uL Final  . Hemoglobin 07/28/2017 11.4* 11.6 - 15.9 g/dL Final  . HCT 07/28/2017 33.8* 34.8 - 46.6 % Final  . MCV 07/28/2017 91.6  79.5 - 101.0 fL Final  . MCH 07/28/2017 30.8  25.1 - 34.0 pg Final  . MCHC 07/28/2017 33.7  31.5 - 36.0 g/dL Final  . RDW 07/28/2017 15.9* 11.2 - 14.5 % Final  . Platelet Count 07/28/2017 306  145 - 400 K/uL Final  . Neutrophils Relative % 07/28/2017 74  % Final  . Neutro Abs 07/28/2017 4.6  1.5 - 6.5 K/uL Final  . Lymphocytes Relative 07/28/2017 15  % Final  . Lymphs Abs 07/28/2017 0.9  0.9 - 3.3 K/uL Final  . Monocytes Relative 07/28/2017 8  % Final  . Monocytes Absolute  07/28/2017 0.5  0.1 - 0.9 K/uL Final  . Eosinophils Relative 07/28/2017 2  % Final  . Eosinophils Absolute 07/28/2017 0.1  0.0 - 0.5 K/uL Final  . Basophils Relative 07/28/2017 1  % Final  . Basophils Absolute 07/28/2017 0.1  0.0 - 0.1 K/uL Final   Performed at [facility], [place] [address], [place], [place] [address]    (this displays the last labs from the last 3 days)  No results found for: TOTALPROTELP, ALBUMINELP, A1GS, A2GS, BETS, BETA2SER, GAMS, MSPIKE, SPEI (this displays SPEP labs)  No results found for: KPAFRELGTCHN, LAMBDASER, KAPLAMBRATIO (kappa/lambda light chains)  No results found for: HGBA, HGBA2QUANT, HGBFQUANT, HGBSQUAN (Hemoglobinopathy evaluation)   No results found for: LDH  No results found for: IRON, TIBC, IRONPCTSAT (Iron and TIBC)  No results found for: FERRITIN  Urinalysis No results found for: COLORURINE, APPEARANCEUR, LABSPEC, PHURINE, GLUCOSEU, HGBUR, BILIRUBINUR, KETONESUR, PROTEINUR, UROBILINOGEN, NITRITE, LEUKOCYTESUR   STUDIES: No results found.  ELIGIBLE FOR AVAILABLE RESEARCH PROTOCOL: UPBEAT; did not qualify for BR 003  ASSESSMENT: 52 y.o. [place] woman status post right breast lower inner quadrant biopsy February 09, 2017 for a clinical T1b N0, stage IB invasive ductal carcinoma, grade 3, triple negative  (1) status post right lumpectomy 03/23/2017 for a pT1b pN0, stage IB invasive ductal carcinoma, grade 3, with negative margins.  (2) adjuvant chemotherapy  consisting of cyclophosphamide and doxorubicin in dose dense fashion x4 started 04/28/2017, completed 06/09/2017, to be followed by weekly paclitaxel with 12 doses planned, starting 06/23/2017, paclitaxel discontinued after 4 doses because of neuropathy; last dose 07/14/2017 (a) echocardiogram 02/25/2017 showed a baseline ejection fraction in the 60-65% range  (b) echocardiogram 06/01/2017 showed an ejection fraction in the 60-65% range  (c) echocardiogram  07/02/2017 showed an ejection fraction in the 60-65% range  (3) adjuvant radiation to follow  (4) genetics testing 02/19/2017 through the STAT Breast panel with reflext to Multi-Cancer panel (83 genes) @ [facility] - No pathogenic mutations detected in ALK, APC, ATM, AXIN2, BAP1, BARD1, BLM, BMPR1A, BRCA1, BRCA2, BRIP1, CASR, CDC73, CDH1, CDK4, CDKN1B, CDKN1C, CDKN2A, CEBPA, CHEK2, CTNNA1, DICER1, DIS3L2, EGFR, EPCAM, FH, FLCN, GATA2, GPC3, GREM1, HOXB13, HRAS, KIT, MAX, MEN1, MET, MITF, MLH1, MSH2, MSH3, MSH6, MUTYH, NBN, NF1, NF2, NTHL1, PALB2, PDGFRA, PHOX2B, PMS2, POLD1, POLE, POT1, PRKAR1A, PTCH1, PTEN, RAD50, RAD51C, RAD51D, RB1, RECQL4, RET, RUNX1, SDHA, SDHAF2, SDHB, SDHC, SDHD, SMAD4, SMARCA4, SMARCB1, SMARCE1, STK11, SUFU, TERC, TERT, TMEM127, TP53, TSC1, TSC2, VHL, WRN, WT1.  (a) Variants of Uncertain Significance in PDGFRA c.3040G>A (p.Ala1014Thr) and SMARCA4 c.2044C>G (p.Leu682Val) noted  PLAN: Kelsey Henry has tolerated her treatment remarkably well, except for the problem with neuropathy.  Her symptoms are a little atypical and some of what she is experiencing may be due to carpal tunnel issues, but I am convinced that she is having early neuropathy symptoms, these are actually worse this week than last, and I think if we persist with chemotherapy she may end up with a significant permanent disability.  Accordingly we are stopping chemotherapy.  She may have her port removed at her and her surgeon's discretion.  She is ready to start radiation at any point and I have alerted her radiation oncologist regarding that  She is going to see me again in June.  At that point she likely will be done with radiation and we will consider long-term follow-up  She knows to call for any other issues that may develop before that visit.     Kelsey Henry, Virgie Dad, MD  07/28/17 12:25 PM Medical Oncology and Hematology [facility] [address] [place], [place] [address] Tel. [phone]     Fax. [phone]  This document serves as a record of services personally performed by Lurline Del, MD. It was created on his behalf by Sheron Nightingale, a trained medical scribe. The creation of this record is based on the scribe's personal observations and the provider's statements to them.   I have reviewed the above documentation for accuracy and completeness, and I agree with the above.    Marland Kitchen

## 2017-07-28 ENCOUNTER — Inpatient Hospital Stay: Payer: BLUE CROSS/BLUE SHIELD

## 2017-07-28 ENCOUNTER — Other Ambulatory Visit: Payer: BLUE CROSS/BLUE SHIELD

## 2017-07-28 ENCOUNTER — Inpatient Hospital Stay (HOSPITAL_BASED_OUTPATIENT_CLINIC_OR_DEPARTMENT_OTHER): Payer: BLUE CROSS/BLUE SHIELD | Admitting: Oncology

## 2017-07-28 VITALS — BP 144/96 | HR 86 | Temp 98.3°F | Resp 18 | Ht 61.0 in | Wt 129.1 lb

## 2017-07-28 DIAGNOSIS — Z171 Estrogen receptor negative status [ER-]: Principal | ICD-10-CM

## 2017-07-28 DIAGNOSIS — Z5111 Encounter for antineoplastic chemotherapy: Secondary | ICD-10-CM | POA: Diagnosis not present

## 2017-07-28 DIAGNOSIS — Z95828 Presence of other vascular implants and grafts: Secondary | ICD-10-CM

## 2017-07-28 DIAGNOSIS — C50311 Malignant neoplasm of lower-inner quadrant of right female breast: Secondary | ICD-10-CM | POA: Diagnosis not present

## 2017-07-28 DIAGNOSIS — G629 Polyneuropathy, unspecified: Secondary | ICD-10-CM

## 2017-07-28 LAB — CBC WITH DIFFERENTIAL (CANCER CENTER ONLY)
Basophils Absolute: 0.1 10*3/uL (ref 0.0–0.1)
Basophils Relative: 1 %
EOS ABS: 0.1 10*3/uL (ref 0.0–0.5)
EOS PCT: 2 %
HCT: 33.8 % — ABNORMAL LOW (ref 34.8–46.6)
Hemoglobin: 11.4 g/dL — ABNORMAL LOW (ref 11.6–15.9)
LYMPHS ABS: 0.9 10*3/uL (ref 0.9–3.3)
Lymphocytes Relative: 15 %
MCH: 30.8 pg (ref 25.1–34.0)
MCHC: 33.7 g/dL (ref 31.5–36.0)
MCV: 91.6 fL (ref 79.5–101.0)
Monocytes Absolute: 0.5 10*3/uL (ref 0.1–0.9)
Monocytes Relative: 8 %
Neutro Abs: 4.6 10*3/uL (ref 1.5–6.5)
Neutrophils Relative %: 74 %
PLATELETS: 306 10*3/uL (ref 145–400)
RBC: 3.69 MIL/uL — AB (ref 3.70–5.45)
RDW: 15.9 % — ABNORMAL HIGH (ref 11.2–14.5)
WBC: 6.3 10*3/uL (ref 3.9–10.3)

## 2017-07-28 LAB — CMP (CANCER CENTER ONLY)
ALK PHOS: 80 U/L (ref 40–150)
ALT: 19 U/L (ref 0–55)
AST: 21 U/L (ref 5–34)
Albumin: 4.1 g/dL (ref 3.5–5.0)
Anion gap: 7 (ref 3–11)
BILIRUBIN TOTAL: 0.4 mg/dL (ref 0.2–1.2)
BUN: 9 mg/dL (ref 7–26)
CHLORIDE: 108 mmol/L (ref 98–109)
CO2: 26 mmol/L (ref 22–29)
CREATININE: 0.8 mg/dL (ref 0.60–1.10)
Calcium: 10.1 mg/dL (ref 8.4–10.4)
GFR, Est AFR Am: >60 mL/min (ref >60)
Glucose, Bld: 83 mg/dL (ref 70–140)
Potassium: 3.8 mmol/L (ref 3.5–5.1)
Sodium: 141 mmol/L (ref 136–145)
Total Protein: 7.1 g/dL (ref 6.4–8.3)

## 2017-07-28 MED ORDER — SODIUM CHLORIDE 0.9% FLUSH
10.0000 mL | Freq: Once | INTRAVENOUS | Status: AC
  Administered 2017-07-28: 10 mL
  Filled 2017-07-28: qty 10

## 2017-07-28 MED ORDER — HEPARIN SOD (PORK) LOCK FLUSH 100 UNIT/ML IV SOLN
500.0000 [IU] | Freq: Once | INTRAVENOUS | Status: AC
  Administered 2017-07-28: 500 [IU]
  Filled 2017-07-28: qty 5

## 2017-07-29 ENCOUNTER — Encounter: Payer: Self-pay | Admitting: Radiation Oncology

## 2017-07-29 NOTE — Progress Notes (Signed)
Location of Breast Cancer: Lower-inner quadrant of right breast of female Noticed a increase in weight without explanation.  Histology per Pathology Report:  Diagnosis 02-09-17 Biopsy of the right breast mass in question, and this showed (CO-SB[facility] [phone]) invasive [ductal] carcinoma, grade 3, estrogen and progesterone receptor negative, HER-2/neu not amplified, with a sickness ratio of 1.23 and the number per cell 2.9.  Diagnosis 02-09-17 Consult Slide , Right Breast @ 5 o'clock, [id number] - INVASIVE DUCTAL CARCINOMA WITH CALCIFICATIONS. - SEE COMMENT. Microscopic Comment The specimen appears grade III. (JBK:ecj 03/30/2017) Enid Cutter MD  Receptor Status: ER(), PR (), Her2-neu (), Ki-()  Did patient present with symptoms (if so, please note symptoms) or was this found on screening mammography?: routine screening mammography October 2018 showing a possible right breast mass    Past/Anticipated interventions by surgeon, if any:Dr. Maginat 02-09-17   Diagnosis 03-23-17 Dr. Rolm Bookbinder 1. Breast, lumpectomy, right - INVASIVE DUCTAL CARCINOMA, GRADE 3, SPANNING 0.8 CM. - HIGH GRADE DUCTAL CARCINOMA IN SITU. - FINAL RESECTION MARGINS ARE NEGATIVE (PARTS #2-4). - BIOPSY SITE. - SEE ONCOLOGY TABLE. 2. Breast, excision, right, additional superior margin - BENIGN BREAST TISSUE WITH RESECTION SITE CHANGES. 3. Breast, excision, right, additional medial margin - BENIGN BREAST TISSUE WITH RESECTION SITE CHANGES. 4. Breast, excision, right, additional lateral margin - BENIGN BREAST TISSUE WITH RESECTION SITE CHANGES. 5. Lymph node, sentinel, biopsy, right - ONE OF ONE LYMPH NODES NEGATIVE FOR CARCINOMA (0/1). 6. Lymph node, sentinel, biopsy, right - ONE OF ONE LYMPH NODES NEGATIVE FOR CARCINOMA (0/1). Receptor Status: ER(0% -), PR (0% -), Her2-neu (-), Ki-(70%)    Past/Anticipated interventions by medical oncology, if any:  Dr. Jana Hakim Chemotherapy yes adjuvant  chemotherapy consisting of cyclophosphamide and doxorubicin in dose dense fashion x4 started 04/28/2017, completed 06/09/2017, to be followed by weekly paclitaxel with 12 doses planned, starting 06/23/2017, paclitaxel discontinued after 4 doses because of neuropathy; last dose 07/14/2017            adjuvant radiation   Genetic Testing 02-19-17 STAT Breast panel with reflext to Multi-Cancer panel (83 genes) @ [facility] - No     Lymphedema issues, if any: No ROM to right arm None, went to PT and was cleared.  Lifts max 5 pounds. Skin to right breast is returning to normal.  Was extremely dry during chemo. Follow up appointment with Dr. Rolm Bookbinder May 8th, ? Port removal.  Pain issues, if any: No, normal breast tenderness.  SAFETY ISSUES:  Prior radiation? :No  Pacemaker/ICD?:No  Possible current pregnancy? : No  Is the patient on methotrexate? :No    Menarche 14   G1 P1 BC She is used oral contraceptives remotely with no complications  Menopause 2012  Hysterectomy HRT      Current Complaints / other details:      Georgena Spurling, RN 07/29/2017,4:58 PM

## 2017-08-04 ENCOUNTER — Ambulatory Visit: Payer: BLUE CROSS/BLUE SHIELD

## 2017-08-04 ENCOUNTER — Other Ambulatory Visit: Payer: BLUE CROSS/BLUE SHIELD

## 2017-08-04 ENCOUNTER — Ambulatory Visit: Payer: BLUE CROSS/BLUE SHIELD | Admitting: Adult Health

## 2017-08-06 ENCOUNTER — Ambulatory Visit
Admission: RE | Admit: 2017-08-06 | Discharge: 2017-08-06 | Disposition: A | Discharge status: Home / Self Care | Payer: BLUE CROSS/BLUE SHIELD | Source: Ambulatory Visit | Attending: Radiation Oncology | Admitting: Radiation Oncology

## 2017-08-06 ENCOUNTER — Encounter: Payer: Self-pay | Admitting: Radiation Oncology

## 2017-08-06 ENCOUNTER — Other Ambulatory Visit: Payer: Self-pay

## 2017-08-06 VITALS — BP 146/96 | HR 91 | Temp 98.6°F | Resp 18 | Ht 61.0 in | Wt 128.0 lb

## 2017-08-06 DIAGNOSIS — Z886 Allergy status to analgesic agent status: Secondary | ICD-10-CM | POA: Insufficient documentation

## 2017-08-06 DIAGNOSIS — Z9889 Other specified postprocedural states: Secondary | ICD-10-CM | POA: Diagnosis not present

## 2017-08-06 DIAGNOSIS — Z171 Estrogen receptor negative status [ER-]: Principal | ICD-10-CM

## 2017-08-06 DIAGNOSIS — Z79899 Other long term (current) drug therapy: Secondary | ICD-10-CM | POA: Diagnosis not present

## 2017-08-06 DIAGNOSIS — K219 Gastro-esophageal reflux disease without esophagitis: Secondary | ICD-10-CM | POA: Insufficient documentation

## 2017-08-06 DIAGNOSIS — Z95828 Presence of other vascular implants and grafts: Secondary | ICD-10-CM | POA: Diagnosis not present

## 2017-08-06 DIAGNOSIS — C50311 Malignant neoplasm of lower-inner quadrant of right female breast: Secondary | ICD-10-CM

## 2017-08-06 DIAGNOSIS — Z9221 Personal history of antineoplastic chemotherapy: Secondary | ICD-10-CM | POA: Insufficient documentation

## 2017-08-06 NOTE — Progress Notes (Signed)
Radiation Oncology         [phone] ________________________________  Name: Kelsey Henry        MRN: [id number]  Date of Service: 08/06/2017 DOB: [birth year]  FX:[id number], No Pcp Per  Magrinat, Virgie Dad, MD     REFERRING PHYSICIAN: Magrinat, Virgie Dad, MD   DIAGNOSIS: The encounter diagnosis was Malignant neoplasm of lower-inner quadrant of right breast of female, estrogen receptor negative ([place]).   HISTORY OF PRESENT ILLNESS: Kelsey Henry is a 52 y.o. female who was originally seen in the multidisciplinary breast clinic for a new diagnosis of right breast cancer. The patient was noted to have screening assymmetry of the right breast. Diagnostic imaging was performed and revealed a 9 x 7 x 6 mm lesion at 5:00. Her axilla was negative by ultrasound for any adenopathy. She underwent a biopsy on 02/09/17 which revealed a grade 3, triple negative invasive ductal carcinoma. She went on to proceed with lumpectomy on 03/23/17 revealing a pT1bN0 invasive ductal carcinoma, grade 3 with negative margins and nodes. She proceeded with adjuvant chemotherapy between April 28, 2017, and ended treatment on 07/14/17. She comes today to discuss options of adjuvant radiotherapy.   PREVIOUS RADIATION THERAPY: No   PAST MEDICAL HISTORY:  Past Medical History:  Diagnosis Date  . Cancer [facility])    breast  . Genetic testing 02/19/2017   STAT Breast panel with reflext to Multi-Cancer panel (83 genes) @ [facility] - No pathogenic mutations detected  . GERD (gastroesophageal reflux disease)    no meds  . Seasonal allergies   . SVD (spontaneous vaginal delivery)    x 1       PAST SURGICAL HISTORY: Past Surgical History:  Procedure Laterality Date  . BILATERAL SALPINGECTOMY Bilateral 06/23/2012   Procedure: BILATERAL SALPINGECTOMY;  Surgeon: Cheri Fowler, MD;  Location: [facility];  Service: Gynecology;  Laterality: Bilateral;  . BREAST LUMPECTOMY WITH RADIOACTIVE SEED AND SENTINEL LYMPH NODE BIOPSY  Right 03/23/2017   Procedure: BREAST LUMPECTOMY WITH RADIOACTIVE SEED AND SENTINEL LYMPH NODE BIOPSY;  Surgeon: Rolm Bookbinder, MD;  Location: [place];  Service: General;  Laterality: Right;  . LAPAROSCOPIC SUPRACERVICAL HYSTERECTOMY N/A 06/23/2012   Procedure: LAPAROSCOPIC SUPRACERVICAL HYSTERECTOMY;  Surgeon: Cheri Fowler, MD;  Location: [place] [facility];  Service: Gynecology;  Laterality: N/A;  . PORTACATH PLACEMENT Right 03/23/2017   Procedure: INSERTION PORT-A-CATH;  Surgeon: Rolm Bookbinder, MD;  Location: [place];  Service: General;  Laterality: Right;  . TUBAL LIGATION    . VULVAR LESION REMOVAL N/A 11/01/2014   Procedure: MONS PUBIS LESION;  Surgeon: Cheri Fowler, MD;  Location: [place] [facility];  Service: Gynecology;  Laterality: N/A;  local anesthesia with IV sedation if needed  . WISDOM TOOTH EXTRACTION       FAMILY HISTORY:  History reviewed. No pertinent family history.   SOCIAL HISTORY:  reports that she has never smoked. She has never used smokeless tobacco. She reports that she does not drink alcohol or use drugs. The patient is married and lives in [place]. 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 docusate sodium (STOOL SOFTENER) 100 MG capsule Take 100 mg by mouth 2 (two) times daily.    Marland Kitchen loratadine (CLARITIN) 10 MG tablet Take 10 mg by mouth daily as needed for allergies.    . Multiple Vitamins-Minerals (MULTIVITAMIN ADULT) CHEW Chew 2 % by mouth.    Marland Kitchen omeprazole (  PRILOSEC) 40 MG capsule TAKE 1 CAPSULE BY MOUTH EVERY DAY 90 capsule 0  . vitamin C (ASCORBIC ACID) 250 MG tablet Take 250 mg by mouth daily.     No current facility-administered medications for this encounter.      REVIEW OF SYSTEMS: On review of systems, the patient reports that she is doing well overall. She denies any chest pain, shortness of breath, cough, fevers, chills, night sweats, unintended weight  changes. She denies any bowel or bladder disturbances, and denies abdominal pain, nausea or vomiting. She denies any new musculoskeletal or joint aches or pains, new skin lesions or concerns. A complete review of systems is obtained and is otherwise negative.      PHYSICAL EXAM:  Wt Readings from Last 3 Encounters:  08/06/17 128 lb (58.1 kg)  07/28/17 129 lb 1.6 oz (58.6 kg)  07/21/17 127 lb 14.4 oz (58 kg)   Temp Readings from Last 3 Encounters:  08/06/17 98.6 F (37 C)  07/28/17 98.3 F (36.8 C) (Oral)  07/21/17 99 F (37.2 C) (Oral)   BP Readings from Last 3 Encounters:  08/06/17 (!) 146/96  07/28/17 (!) 144/96  07/21/17 138/78   Pulse Readings from Last 3 Encounters:  08/06/17 91  07/28/17 86  07/21/17 99     In general this is a well appearing African American  female in no acute distress. She is alert and oriented x4 and appropriate throughout the examination. HEENT reveals that the patient is normocephalic, atraumatic. EOMs are intact.I Cardiopulmonary assessment is negative for acute distress and she exhibits normal effort. The right breast is examined and reveals a well healed inframammary incision site, she has an in situ right port a cath. No edema of the chest wall is noted.    ECOG = 0  0 - Asymptomatic (Fully active, able to carry on all predisease activities without restriction)  1 - Symptomatic but completely ambulatory (Restricted in physically strenuous activity but ambulatory and able to carry out work of a light or sedentary nature. For example, light housework, office work)  2 - Symptomatic, <50% in bed during the day (Ambulatory and capable of all self care but unable to carry out any work activities. Up and about more than 50% of waking hours)  3 - Symptomatic, >50% in bed, but not bedbound (Capable of only limited self-care, confined to bed or chair 50% or more of waking hours)  4 - Bedbound (Completely disabled. Cannot carry on any self-care.  Totally confined to bed or chair)  5 - Death   Eustace Pen MM, Creech RH, Tormey DC, et al. [phone]). "Toxicity and response criteria of the [facility]". [place] Oncol. 5 (6): 649-55    LABORATORY DATA:  Lab Results  Component Value Date   WBC 6.3 07/28/2017   HGB 11.4 (L) 07/28/2017   HCT 33.8 (L) 07/28/2017   MCV 91.6 07/28/2017   PLT 306 07/28/2017   Lab Results  Component Value Date   NA 141 07/28/2017   K 3.8 07/28/2017   CL 108 07/28/2017   CO2 26 07/28/2017   Lab Results  Component Value Date   ALT 19 07/28/2017   AST 21 07/28/2017   ALKPHOS 80 07/28/2017   BILITOT 0.4 07/28/2017      RADIOGRAPHY: No results found.     IMPRESSION/PLAN: 1. Stage IB, pT1bN0M0 grade 3 triple negative invasive ductal carcinoma of the right breast. Dr. Lisbeth Renshaw has reviewed her course and she is appropriate to proceed  with adjuvant external radiotherapy to the breast. We discussed the risks, benefits, short, and long term effects of radiotherapy, and the patient is interested in proceeding. Dr. Lisbeth Renshaw discusses the delivery and logistics of radiotherapy and anticipates a course of 6 1/2 weeks. Written consent is obtained and placed in the chart, a copy was provided to the patient. She will return for simulation in a few weeks. 2. Central IV access with PAC. Dr. Ida Rogue preference would be to proceed with radiotherapy and leave her PAC in situ through radiation, however this has caused symptoms of discomfort, and she'd like to have it out. She will meet with Dr. Donne Hazel to have this removed. I will reach out to he and his surgery scheduler to see if it's possible to proceed with removal soon. We would anticipate 2 weeks of healing prior to simulation.   In a visit lasting 25 minutes, greater than 50% of the time was spent face to face discussing her case, and coordinating the patient's care.   The above documentation reflects my direct findings during this shared patient  visit. Please see the separate note by Dr. Lisbeth Renshaw on this date for the remainder of the patient's plan of care.    Carola Rhine, PAC

## 2017-08-11 ENCOUNTER — Other Ambulatory Visit: Payer: BLUE CROSS/BLUE SHIELD

## 2017-08-11 ENCOUNTER — Ambulatory Visit: Payer: BLUE CROSS/BLUE SHIELD

## 2017-08-11 ENCOUNTER — Other Ambulatory Visit: Payer: Self-pay

## 2017-08-11 ENCOUNTER — Encounter (HOSPITAL_BASED_OUTPATIENT_CLINIC_OR_DEPARTMENT_OTHER): Payer: Self-pay | Admitting: *Deleted

## 2017-08-12 ENCOUNTER — Other Ambulatory Visit: Payer: Self-pay | Admitting: General Surgery

## 2017-08-13 ENCOUNTER — Encounter (HOSPITAL_BASED_OUTPATIENT_CLINIC_OR_DEPARTMENT_OTHER)
Admission: RE | Disposition: A | Discharge status: Home / Self Care | Payer: Self-pay | Source: Ambulatory Visit | Attending: General Surgery

## 2017-08-13 ENCOUNTER — Ambulatory Visit (HOSPITAL_BASED_OUTPATIENT_CLINIC_OR_DEPARTMENT_OTHER)
Admission: RE | Admit: 2017-08-13 | Discharge: 2017-08-13 | Disposition: A | Discharge status: Home / Self Care | Payer: BLUE CROSS/BLUE SHIELD | Source: Ambulatory Visit | Attending: General Surgery | Admitting: General Surgery

## 2017-08-13 ENCOUNTER — Ambulatory Visit (HOSPITAL_BASED_OUTPATIENT_CLINIC_OR_DEPARTMENT_OTHER): Payer: BLUE CROSS/BLUE SHIELD | Admitting: Certified Registered"

## 2017-08-13 ENCOUNTER — Encounter (HOSPITAL_BASED_OUTPATIENT_CLINIC_OR_DEPARTMENT_OTHER): Payer: Self-pay | Admitting: Certified Registered"

## 2017-08-13 ENCOUNTER — Other Ambulatory Visit: Payer: Self-pay

## 2017-08-13 DIAGNOSIS — Z79899 Other long term (current) drug therapy: Secondary | ICD-10-CM | POA: Insufficient documentation

## 2017-08-13 DIAGNOSIS — Z452 Encounter for adjustment and management of vascular access device: Secondary | ICD-10-CM | POA: Insufficient documentation

## 2017-08-13 DIAGNOSIS — C50911 Malignant neoplasm of unspecified site of right female breast: Secondary | ICD-10-CM | POA: Insufficient documentation

## 2017-08-13 DIAGNOSIS — K219 Gastro-esophageal reflux disease without esophagitis: Secondary | ICD-10-CM | POA: Diagnosis not present

## 2017-08-13 DIAGNOSIS — N946 Dysmenorrhea, unspecified: Secondary | ICD-10-CM | POA: Diagnosis not present

## 2017-08-13 DIAGNOSIS — C50311 Malignant neoplasm of lower-inner quadrant of right female breast: Secondary | ICD-10-CM | POA: Diagnosis not present

## 2017-08-13 HISTORY — PX: PORT-A-CATH REMOVAL: SHX5289

## 2017-08-13 SURGERY — REMOVAL PORT-A-CATH
Anesthesia: Monitor Anesthesia Care | Site: Chest

## 2017-08-13 MED ORDER — FENTANYL CITRATE (PF) 100 MCG/2ML IJ SOLN
INTRAMUSCULAR | Status: DC | PRN
  Administered 2017-08-13 (×2): 50 ug via INTRAVENOUS

## 2017-08-13 MED ORDER — SCOPOLAMINE 1 MG/3DAYS TD PT72: 1.0000 | MEDICATED_PATCH | Freq: Once | TRANSDERMAL | Status: DC | PRN

## 2017-08-13 MED ORDER — PROPOFOL 500 MG/50ML IV EMUL
INTRAVENOUS | Status: DC | PRN
  Administered 2017-08-13: 50 ug/kg/min via INTRAVENOUS

## 2017-08-13 MED ORDER — FENTANYL CITRATE (PF) 100 MCG/2ML IJ SOLN: 50.0000 ug | INTRAMUSCULAR | Status: DC | PRN

## 2017-08-13 MED ORDER — BUPIVACAINE HCL (PF) 0.25 % IJ SOLN
INTRAMUSCULAR | Status: DC | PRN
  Administered 2017-08-13: 2.5 mL

## 2017-08-13 MED ORDER — ACETAMINOPHEN 500 MG PO TABS
ORAL_TABLET | ORAL | Status: AC
  Filled 2017-08-13: qty 2

## 2017-08-13 MED ORDER — PROPOFOL 10 MG/ML IV BOLUS
INTRAVENOUS | Status: AC
  Filled 2017-08-13: qty 20

## 2017-08-13 MED ORDER — ACETAMINOPHEN 500 MG PO TABS
1000.0000 mg | ORAL_TABLET | ORAL | Status: AC
  Administered 2017-08-13: 1000 mg via ORAL

## 2017-08-13 MED ORDER — LIDOCAINE HCL (CARDIAC) PF 100 MG/5ML IV SOSY
PREFILLED_SYRINGE | INTRAVENOUS | Status: AC
  Filled 2017-08-13: qty 5

## 2017-08-13 MED ORDER — LIDOCAINE-EPINEPHRINE (PF) 1 %-1:200000 IJ SOLN
INTRAMUSCULAR | Status: AC
  Filled 2017-08-13: qty 30

## 2017-08-13 MED ORDER — LACTATED RINGERS IV SOLN
INTRAVENOUS | Status: DC
  Administered 2017-08-13: 13:00:00 via INTRAVENOUS

## 2017-08-13 MED ORDER — LIDOCAINE HCL (PF) 1 % IJ SOLN
INTRAMUSCULAR | Status: AC
  Filled 2017-08-13: qty 30

## 2017-08-13 MED ORDER — MEPERIDINE HCL 25 MG/ML IJ SOLN: 6.2500 mg | INTRAMUSCULAR | Status: DC | PRN

## 2017-08-13 MED ORDER — OXYCODONE HCL 5 MG PO TABS: 5.0000 mg | ORAL_TABLET | Freq: Once | ORAL | Status: DC | PRN

## 2017-08-13 MED ORDER — ONDANSETRON HCL 4 MG/2ML IJ SOLN
INTRAMUSCULAR | Status: AC
  Filled 2017-08-13: qty 2

## 2017-08-13 MED ORDER — FENTANYL CITRATE (PF) 100 MCG/2ML IJ SOLN
INTRAMUSCULAR | Status: AC
Start: 2017-08-13 — End: ?
  Filled 2017-08-13: qty 2

## 2017-08-13 MED ORDER — LIDOCAINE-EPINEPHRINE (PF) 1 %-1:200000 IJ SOLN
INTRAMUSCULAR | Status: DC | PRN
  Administered 2017-08-13: 2.5 mL

## 2017-08-13 MED ORDER — PROMETHAZINE HCL 25 MG/ML IJ SOLN: 6.2500 mg | INTRAMUSCULAR | Status: DC | PRN

## 2017-08-13 MED ORDER — GABAPENTIN 100 MG PO CAPS
100.0000 mg | ORAL_CAPSULE | ORAL | Status: AC
  Administered 2017-08-13: 100 mg via ORAL

## 2017-08-13 MED ORDER — ONDANSETRON HCL 4 MG/2ML IJ SOLN
INTRAMUSCULAR | Status: DC | PRN
  Administered 2017-08-13: 4 mg via INTRAVENOUS

## 2017-08-13 MED ORDER — GABAPENTIN 100 MG PO CAPS
ORAL_CAPSULE | ORAL | Status: AC
  Filled 2017-08-13: qty 1

## 2017-08-13 MED ORDER — MIDAZOLAM HCL 2 MG/2ML IJ SOLN
INTRAMUSCULAR | Status: AC
  Filled 2017-08-13: qty 2

## 2017-08-13 MED ORDER — LIDOCAINE 2% (20 MG/ML) 5 ML SYRINGE
INTRAMUSCULAR | Status: DC | PRN
  Administered 2017-08-13: 50 mg via INTRAVENOUS

## 2017-08-13 MED ORDER — PHENYLEPHRINE 40 MCG/ML (10ML) SYRINGE FOR IV PUSH (FOR BLOOD PRESSURE SUPPORT)
PREFILLED_SYRINGE | INTRAVENOUS | Status: DC | PRN
  Administered 2017-08-13: 40 ug via INTRAVENOUS

## 2017-08-13 MED ORDER — MIDAZOLAM HCL 5 MG/5ML IJ SOLN
INTRAMUSCULAR | Status: DC | PRN
  Administered 2017-08-13: 2 mg via INTRAVENOUS

## 2017-08-13 MED ORDER — DEXAMETHASONE SODIUM PHOSPHATE 4 MG/ML IJ SOLN
INTRAMUSCULAR | Status: DC | PRN
  Administered 2017-08-13: 10 mg via INTRAVENOUS

## 2017-08-13 MED ORDER — DEXAMETHASONE SODIUM PHOSPHATE 10 MG/ML IJ SOLN
INTRAMUSCULAR | Status: AC
  Filled 2017-08-13: qty 1

## 2017-08-13 MED ORDER — MIDAZOLAM HCL 2 MG/2ML IJ SOLN: 1.0000 mg | INTRAMUSCULAR | Status: DC | PRN

## 2017-08-13 MED ORDER — OXYCODONE HCL 5 MG/5ML PO SOLN: 5.0000 mg | Freq: Once | ORAL | Status: DC | PRN

## 2017-08-13 MED ORDER — LACTATED RINGERS IV SOLN: INTRAVENOUS | Status: DC

## 2017-08-13 MED ORDER — FENTANYL CITRATE (PF) 100 MCG/2ML IJ SOLN: 25.0000 ug | INTRAMUSCULAR | Status: DC | PRN

## 2017-08-13 SURGICAL SUPPLY — 30 items
ADH SKN CLS APL DERMABOND .7 (GAUZE/BANDAGES/DRESSINGS) ×1
BLADE SURG 15 STRL LF DISP TIS (BLADE) ×1 IMPLANT
BLADE SURG 15 STRL SS (BLADE) ×2
CHLORAPREP W/TINT 26ML (MISCELLANEOUS) ×2 IMPLANT
COVER BACK TABLE 60X90IN (DRAPES) ×2 IMPLANT
COVER MAYO STAND STRL (DRAPES) ×2 IMPLANT
DECANTER SPIKE VIAL GLASS SM (MISCELLANEOUS) ×1 IMPLANT
DERMABOND ADVANCED (GAUZE/BANDAGES/DRESSINGS) ×1
DERMABOND ADVANCED .7 DNX12 (GAUZE/BANDAGES/DRESSINGS) ×1 IMPLANT
DRAPE LAPAROTOMY 100X72 PEDS (DRAPES) ×2 IMPLANT
DRAPE UTILITY XL STRL (DRAPES) ×2 IMPLANT
ELECT COATED BLADE 2.86 ST (ELECTRODE) ×2 IMPLANT
ELECT REM PT RETURN 9FT ADLT (ELECTROSURGICAL) ×2
ELECTRODE REM PT RTRN 9FT ADLT (ELECTROSURGICAL) ×1 IMPLANT
GLOVE BIO SURGEON STRL SZ7 (GLOVE) ×2 IMPLANT
GLOVE BIOGEL PI IND STRL 7.5 (GLOVE) ×1 IMPLANT
GLOVE BIOGEL PI INDICATOR 7.5 (GLOVE) ×2
GLOVE SURG SS PI 6.5 STRL IVOR (GLOVE) ×1 IMPLANT
GOWN STRL REUS W/ TWL LRG LVL3 (GOWN DISPOSABLE) ×2 IMPLANT
GOWN STRL REUS W/TWL LRG LVL3 (GOWN DISPOSABLE) ×4
NDL HYPO 25X1 1.5 SAFETY (NEEDLE) ×1 IMPLANT
NEEDLE HYPO 25X1 1.5 SAFETY (NEEDLE) ×2 IMPLANT
PACK BASIN DAY SURGERY FS (CUSTOM PROCEDURE TRAY) ×2 IMPLANT
PENCIL BUTTON HOLSTER BLD 10FT (ELECTRODE) ×2 IMPLANT
SLEEVE SCD COMPRESS KNEE MED (MISCELLANEOUS) IMPLANT
SUT MNCRL AB 4-0 PS2 18 (SUTURE) ×2 IMPLANT
SUT VIC AB 3-0 SH 27 (SUTURE) ×2
SUT VIC AB 3-0 SH 27X BRD (SUTURE) ×1 IMPLANT
SYR CONTROL 10ML LL (SYRINGE) ×2 IMPLANT
TOWEL OR NON WOVEN STRL DISP B (DISPOSABLE) ×2 IMPLANT

## 2017-08-13 NOTE — Transfer of Care (Signed)
Immediate Anesthesia Transfer of Care Note  Patient: Kelsey Henry  Procedure(s) Performed: REMOVAL PORT-A-CATH (N/A Chest)  Patient Location: PACU  Anesthesia Type:MAC  Level of Consciousness: awake, alert  and oriented  Airway & Oxygen Therapy: Patient Spontanous Breathing  Post-op Assessment: Report given to RN and Post -op Vital signs reviewed and stable  Post vital signs: Reviewed and stable  Last Vitals:  Vitals Value Taken Time  BP 102/67 08/13/2017  1:54 PM  Temp    Pulse 85 08/13/2017  1:55 PM  Resp 14 08/13/2017  1:55 PM  SpO2 97 % 08/13/2017  1:55 PM  Vitals shown include unvalidated device data.  Last Pain:  Vitals:   08/13/17 1226  TempSrc: Oral  PainSc: 0-No pain      Patients Stated Pain Goal: 0 (98/92/11 9417)  Complications: No apparent anesthesia complications

## 2017-08-13 NOTE — Op Note (Signed)
Preoperative diagnosis: Breast cancer no longer needs venous access Postoperative diagnosis: Same as above Procedure: Right internal jugular port removal Surgeon: Dr. Serita Grammes Anesthesia: Local with MAC Complications: None Estimated blood loss: Minimal Drains: None Specimens: None Sponge needle count was correct at completion Disposition to recovery in stable condition  Indications: This is a 52 year old female who is undergone lumpectomy and sentinel node for breast cancer.  She has stopped her chemotherapy early and will not receive any more.  She is due to begin radiation therapy.  She would like her port removed.  Procedure: After informed consent was obtained the patient was taken to the operating room.  She was placed under sedation per her request.  She was prepped and draped in the standard sterile surgical fashion.  A surgical timeout was then performed.  I infiltrated a mixture of 1% lidocaine with epinephrine and quarter percent Marcaine.  I reentered her old scar.  The port was then dissected free from the surrounding structures I then remove the port in the line in their entirety.  The stitch material was also removed.  Hemostasis was observed.  I closed this with 3-0 Vicryl, 4-0 Monocryl, and glue.  She tolerated this well and was transferred to recovery.

## 2017-08-13 NOTE — Interval H&P Note (Signed)
History and Physical Interval Note:  08/13/2017 1:19 PM  Kelsey Henry  has presented today for surgery, with the diagnosis of RIGHT BREAST CANCER  The various methods of treatment have been discussed with the patient and family. After consideration of risks, benefits and other options for treatment, the patient has consented to  Procedure(s): REMOVAL PORT-A-CATH (N/A) as a surgical intervention .  The patient's history has been reviewed, patient examined, no change in status, stable for surgery.  I have reviewed the patient's chart and labs.  Questions were answered to the patient's satisfaction.     Rolm Bookbinder

## 2017-08-13 NOTE — Anesthesia Postprocedure Evaluation (Signed)
Anesthesia Post Note  Patient: Kelsey Henry  Procedure(s) Performed: REMOVAL PORT-A-CATH (N/A Chest)     Patient location during evaluation: PACU Anesthesia Type: MAC Level of consciousness: awake and alert Pain management: pain level controlled Vital Signs Assessment: post-procedure vital signs reviewed and stable Respiratory status: spontaneous breathing, nonlabored ventilation, respiratory function stable and patient connected to nasal cannula oxygen Cardiovascular status: stable and blood pressure returned to baseline Postop Assessment: no apparent nausea or vomiting Anesthetic complications: no    Last Vitals:  Vitals:   08/13/17 1400 08/13/17 1415  BP: 98/62 117/83  Pulse: 75   Resp: 14 16  Temp:    SpO2: 96%     Last Pain:  Vitals:   08/13/17 1415  TempSrc:   PainSc: 0-No pain                 [name]

## 2017-08-13 NOTE — H&P (Signed)
Kelsey Henry is an 52 y.o. female.   Chief Complaint: breast cancer HPI: 14 yof s/p lumpectomy/sn biopsy followed by adjuvant chemotherapy. She is done with chemo. About to start radiation. Desires port removal.  Past Medical History:  Diagnosis Date  . Cancer [facility])    breast  . Genetic testing 02/19/2017   STAT Breast panel with reflext to Multi-Cancer panel (83 genes) @ [facility] - No pathogenic mutations detected  . GERD (gastroesophageal reflux disease)    no meds  . Seasonal allergies   . SVD (spontaneous vaginal delivery)    x 1    Past Surgical History:  Procedure Laterality Date  . ABDOMINAL HYSTERECTOMY    . BILATERAL SALPINGECTOMY Bilateral 06/23/2012   Procedure: BILATERAL SALPINGECTOMY;  Surgeon: Cheri Fowler, MD;  Location: [place] [facility];  Service: Gynecology;  Laterality: Bilateral;  . BREAST LUMPECTOMY WITH RADIOACTIVE SEED AND SENTINEL LYMPH NODE BIOPSY Right 03/23/2017   Procedure: BREAST LUMPECTOMY WITH RADIOACTIVE SEED AND SENTINEL LYMPH NODE BIOPSY;  Surgeon: Rolm Bookbinder, MD;  Location: [place];  Service: General;  Laterality: Right;  . LAPAROSCOPIC SUPRACERVICAL HYSTERECTOMY N/A 06/23/2012   Procedure: LAPAROSCOPIC SUPRACERVICAL HYSTERECTOMY;  Surgeon: Cheri Fowler, MD;  Location: [place] [facility];  Service: Gynecology;  Laterality: N/A;  . PORTACATH PLACEMENT Right 03/23/2017   Procedure: INSERTION PORT-A-CATH;  Surgeon: Rolm Bookbinder, MD;  Location: [place];  Service: General;  Laterality: Right;  . TUBAL LIGATION    . VULVAR LESION REMOVAL N/A 11/01/2014   Procedure: MONS PUBIS LESION;  Surgeon: Cheri Fowler, MD;  Location: [place] [facility];  Service: Gynecology;  Laterality: N/A;  local anesthesia with IV sedation if needed  . WISDOM TOOTH EXTRACTION      History reviewed. No pertinent family history. Social History:  reports that she has never smoked. She has never used smokeless tobacco. She reports that she does not drink alcohol or use drugs.  Allergies:   Allergies  Allergen Reactions  . Aspirin Hives    Medications Prior to Admission  Medication Sig Dispense Refill  . docusate sodium (STOOL SOFTENER) 100 MG capsule Take 100 mg by mouth 2 (two) times daily.    Marland Kitchen loratadine (CLARITIN) 10 MG tablet Take 10 mg by mouth daily as needed for allergies.    . Multiple Vitamins-Minerals (MULTIVITAMIN ADULT) CHEW Chew 2 % by mouth.    Marland Kitchen omeprazole (PRILOSEC) 40 MG capsule TAKE 1 CAPSULE BY MOUTH EVERY DAY 90 capsule 0  . vitamin C (ASCORBIC ACID) 250 MG tablet Take 250 mg by mouth daily.      No results found for this or any previous visit (from the past 48 hour(s)). No results found.  Review of Systems  All other systems reviewed and are negative.   Blood pressure 131/88, pulse 88, temperature 98.1 F (36.7 C), temperature source Oral, resp. rate 16, height 5\' 1"  (1.549 m), weight 57.6 kg (127 lb), last menstrual period 05/26/2012, SpO2 100 %. Physical Exam  Constitutional: She appears well-developed and well-nourished.  Cardiovascular: Normal rate and regular rhythm.  Respiratory: Effort normal and breath sounds normal.     Assessment/Plan Port removal  Rolm Bookbinder, MD 08/13/2017, 1:18 PM

## 2017-08-13 NOTE — Anesthesia Preprocedure Evaluation (Addendum)
Anesthesia Evaluation  Patient identified by MRN, date of birth, ID band Patient awake    Reviewed: Allergy & Precautions, NPO status , Patient's Chart, lab work & pertinent test results  Airway Mallampati: I  TM Distance: >3 FB Neck ROM: Full    Dental  (+) Teeth Intact, Dental Advisory Given   Pulmonary neg pulmonary ROS,    breath sounds clear to auscultation       Cardiovascular negative cardio ROS   Rhythm:Regular Rate:Normal     Neuro/Psych negative neurological ROS  negative psych ROS   GI/Hepatic Neg liver ROS, GERD  Medicated,  Endo/Other  negative endocrine ROS  Renal/GU negative Renal ROS     Musculoskeletal negative musculoskeletal ROS (+)   Abdominal Normal abdominal exam  (+)   Peds  Hematology negative hematology ROS (+)   Anesthesia Other Findings   Reproductive/Obstetrics                            Anesthesia Physical Anesthesia Plan  ASA: II  Anesthesia Plan: MAC   Post-op Pain Management:    Induction: Intravenous  PONV Risk Score and Plan: 3 and Ondansetron, Dexamethasone and Midazolam  Airway Management Planned: Simple Face Mask  Additional Equipment: None  Intra-op Plan:   Post-operative Plan:   Informed Consent: I have reviewed the patients History and Physical, chart, labs and discussed the procedure including the risks, benefits and alternatives for the proposed anesthesia with the patient or authorized representative who has indicated his/her understanding and acceptance.     Plan Discussed with: CRNA  Anesthesia Plan Comments:         Anesthesia Quick Evaluation

## 2017-08-13 NOTE — Discharge Instructions (Signed)
Always review your discharge instruction sheet given to you by the facility where your surgery was performed.   1. A prescription for pain medication may be given to you upon discharge. Take your pain medication as prescribed, if needed. If narcotic pain medicine is not needed, then you make take acetaminophen (Tylenol) or ibuprofen (Advil) as needed. No Tylenol until 6:30pm 2. Take your usually prescribed medications unless otherwise directed. 3. If you need a refill on your pain medication, please contact our office. All narcotic pain medicine now requires a paper prescription.  Phoned in and fax refills are no longer allowed by law.  Prescriptions will not be filled after 5 pm or on weekends.  4. You should follow a light diet for the remainder of the day after your procedure. 5. Most patients will experience some mild swelling and/or bruising in the area of the incision. It may take several days to resolve. 6. It is common to experience some constipation if taking pain medication after surgery. Increasing fluid intake and taking a stool softener (such as Colace) will usually help or prevent this problem from occurring. A mild laxative (Milk of Magnesia or Miralax) should be taken according to package directions if there are no bowel movements after 48 hours.  7. Unless discharge instructions indicate otherwise, you may remove your bandages 48 hours after surgery, and you may shower at that time. You may have steri-strips (small white skin tapes) in place directly over the incision.  These strips should be left on the skin for 7-10 days.  If your surgeon used Dermabond (skin glue) on the incision, you may shower in 24 hours.  The glue will flake off over the next 2-3 weeks.  8. If your port is left accessed at the end of surgery (needle left in port), the dressing cannot get wet and should only by changed by a healthcare professional. When the port is no longer accessed (when the needle has been  removed), follow step 7.     WHEN TO CALL YOUR DOCTOR [phone]): 1. Fever over 101.0 2. Chills 3. Continued bleeding from incision 4. Increased redness and tenderness at the site 5. Shortness of breath, difficulty breathing   The clinic staff is available to answer your questions during regular business hours. Please dont hesitate to call and ask to speak to one of the nurses or medical assistants for clinical concerns. If you have a medical emergency, go to the nearest emergency room or call 911.  A surgeon from [facility] is always on call at the hospital.     For further information, please visit [email or web address]    Post Anesthesia Home Care Instructions  Activity: Get plenty of rest for the remainder of the day. A responsible individual must stay with you for 24 hours following the procedure.  For the next 24 hours, DO NOT: -Drive a car -Paediatric nurse -Drink alcoholic beverages -Take any medication unless instructed by your physician -Make any legal decisions or sign important papers.  Meals: Start with liquid foods such as gelatin or soup. Progress to regular foods as tolerated. Avoid greasy, spicy, heavy foods. If nausea and/or vomiting occur, drink only clear liquids until the nausea and/or vomiting subsides. Call your physician if vomiting continues.  Special Instructions/Symptoms: Your throat may feel dry or sore from the anesthesia or the breathing tube placed in your throat during surgery. If this causes discomfort, gargle with warm salt water. The discomfort should disappear within 24 hours.  If you had a scopolamine patch placed behind your ear for the management of post- operative nausea and/or vomiting:  1. The medication in the patch is effective for 72 hours, after which it should be removed.  Wrap patch in a tissue and discard in the trash. Wash hands thoroughly with soap and water. 2. You may remove the patch earlier than  72 hours if you experience unpleasant side effects which may include dry mouth, dizziness or visual disturbances. 3. Avoid touching the patch. Wash your hands with soap and water after contact with the patch.

## 2017-08-14 ENCOUNTER — Encounter (HOSPITAL_BASED_OUTPATIENT_CLINIC_OR_DEPARTMENT_OTHER): Payer: Self-pay | Admitting: General Surgery

## 2017-08-18 ENCOUNTER — Other Ambulatory Visit: Payer: BLUE CROSS/BLUE SHIELD

## 2017-08-18 ENCOUNTER — Ambulatory Visit: Payer: BLUE CROSS/BLUE SHIELD

## 2017-08-18 ENCOUNTER — Ambulatory Visit: Payer: BLUE CROSS/BLUE SHIELD | Admitting: Adult Health

## 2017-08-23 NOTE — Progress Notes (Signed)
[place]  [phone]) [phone] Fax:(336) [phone]     ID: Kelsey Henry DOB: 1965-08-22  MR#: [id number]  RKY#:[id number]  Patient Care Team: Patient, No Pcp Per as PCP - General (General Practice) Magrinat, Virgie Dad, MD as Consulting Physician (Oncology) Kyung Rudd, MD as Consulting Physician (Radiation Oncology) Rolm Bookbinder, MD as Consulting Physician (General Surgery) Meisinger, Sherren Mocha, MD as Consulting Physician (Obstetrics and Gynecology) Loletta Specter, MD (Unknown Physician Specialty) OTHER MD:  CHIEF COMPLAINT: triple negative breast cancer  CURRENT TREATMENT: Adjuvant radiation pending   HISTORY OF CURRENT ILLNESS: From the original intake note:  Kelsey Henry had routine screening mammography October 2018 showing a possible right breast mass.  She was scheduled for unilateral right mammography and tomography with right breast ultrasonography at the breast center February 06, 2017.  This showed the breast density to be category D.  In the lower inner quadrant of the right breast there was a 0.6 cm oval mass, which by ultrasound measured 0.9 cm and was irregular and hypoechoic.  Ultrasound of the right axilla was sonographically benign.  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  The patient's subsequent history is as detailed below.  INTERVAL HISTORY: Kelsey Henry returns today for follow-up and treatment of her triple negative breast cancer.   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   Adjuvant radiation is pending at this time, to start next week, after her CT simulation this week.  REVIEW OF SYSTEMS: Kelsey Henry is doing well overall since stopping  chemotherapy on April 2019. Her hair has started to grow back. She will return to work June 10th, 2019. She has been doing cardio and lifting weights. Kelsey Henry feels some pain to her left breast and thinks it is related to working out. She denies unusual headaches, visual changes, nausea, vomiting, or dizziness. There has been no unusual cough, phlegm production, or pleurisy. This been no change in bowel or bladder habits. She denies unexplained fatigue or unexplained weight loss, bleeding, rash, or fever. A detailed review of systems was otherwise noncontributory.    PAST MEDICAL HISTORY: Past Medical History:  Diagnosis Date  . Cancer [facility])    breast  . Genetic testing 02/19/2017   STAT Breast panel with reflext to Multi-Cancer panel (83 genes) @ [facility] - No pathogenic mutations detected  . GERD (gastroesophageal reflux disease)    no meds  . Seasonal allergies   . SVD (spontaneous vaginal delivery)    x 1    PAST SURGICAL HISTORY: Past Surgical History:  Procedure Laterality Date  . ABDOMINAL HYSTERECTOMY    . BILATERAL SALPINGECTOMY Bilateral 06/23/2012   Procedure: BILATERAL SALPINGECTOMY;  Surgeon: Kelsey Fowler, MD;  Location: [facility];  Service: Gynecology;  Laterality: Bilateral;  . BREAST LUMPECTOMY WITH RADIOACTIVE SEED AND SENTINEL LYMPH NODE BIOPSY Right 03/23/2017   Procedure: BREAST LUMPECTOMY WITH RADIOACTIVE SEED AND SENTINEL LYMPH NODE BIOPSY;  Surgeon: Rolm Bookbinder, MD;  Location: [facility];  Service: General;  Laterality: Right;  . LAPAROSCOPIC SUPRACERVICAL HYSTERECTOMY N/A 06/23/2012   Procedure: LAPAROSCOPIC SUPRACERVICAL HYSTERECTOMY;  Surgeon: Kelsey Fowler, MD;  Location: [facility];  Service: Gynecology;  Laterality: N/A;  . PORT-A-CATH REMOVAL N/A 08/13/2017   Procedure: REMOVAL PORT-A-CATH;  Surgeon: Donne Hazel,  Rodman Key, MD;  Location: [facility];  Service: General;  Laterality: N/A;  . PORTACATH PLACEMENT Right 03/23/2017   Procedure: INSERTION  PORT-A-CATH;  Surgeon: Rolm Bookbinder, MD;  Location: [facility];  Service: General;  Laterality: Right;  . TUBAL LIGATION    . VULVAR LESION REMOVAL N/A 11/01/2014   Procedure: MONS PUBIS LESION;  Surgeon: Kelsey Fowler, MD;  Location: [facility];  Service: Gynecology;  Laterality: N/A;  local anesthesia with IV sedation if needed  . WISDOM TOOTH EXTRACTION      FAMILY HISTORY No family history on file.The patient has no information regarding her father or his side of the family.  The patient's mother is 28 years old as of November 2018.  The patient has 3 brothers, 2 sisters.  One sister had 2 "benign tumors" removed from the right breast at the age of 56.  A maternal grandfather had prostate cancer in his 55s.  GYNECOLOGIC HISTORY:  Patient's last menstrual period was 05/26/2012. Menarche age 57, first live birth age 89, the patient is GX P1.  She stopped having periods 2012 when she underwent hysterectomy, without salpingo-oophorectomy..  She used hormone replacement for approximately 3 months.  She is used oral contraceptives remotely with no complications.  SOCIAL HISTORY:  [name] works as an Psychologist, educational for [employer], in the paternal DNA subsection.  Her husband Kelsey Henry is an Clinical biochemist.  Their son Kelsey Henry is an Producer, television/film/video and lives in [place].  The patient has no grandchildren.  She is a Psychologist, forensic.    ADVANCED DIRECTIVES: Not in place   HEALTH MAINTENANCE: Social History   Tobacco Use  . Smoking status: Never Smoker  . Smokeless tobacco: Never Used  Substance Use Topics  . Alcohol use: No  . Drug use: No     Colonoscopy: n/a  PAP: Status post hysterectomy  Bone density: n/a   Allergies  Allergen Reactions  . Aspirin Hives    Current Outpatient Medications  Medication Sig Dispense Refill  . docusate sodium (STOOL SOFTENER) 100 MG capsule Take 100 mg by mouth 2 (two) times daily.    Marland Kitchen loratadine (CLARITIN) 10 MG tablet Take 10 mg by mouth daily as needed for allergies.     . Multiple Vitamins-Minerals (MULTIVITAMIN ADULT) CHEW Chew 2 % by mouth.    Marland Kitchen omeprazole (PRILOSEC) 40 MG capsule TAKE 1 CAPSULE BY MOUTH EVERY DAY 90 capsule 0  . vitamin C (ASCORBIC ACID) 250 MG tablet Take 250 mg by mouth daily.     No current facility-administered medications for this visit.     OBJECTIVE: Middle-aged African-American woman who appears well  Vitals:   08/24/17 1153  BP: (!) 139/56  Pulse: 96  Resp: 18  Temp: 98.6 F (37 C)  SpO2: 100%     Body mass index is 23.83 kg/m.   Wt Readings from Last 3 Encounters:  08/24/17 126 lb 1.6 oz (57.2 kg)  08/13/17 127 lb (57.6 kg)  08/06/17 128 lb (58.1 kg)      ECOG FS:0 - Asymptomatic  Sclerae unicteric, EOMs intact Oropharynx clear and moist No cervical or supraclavicular adenopathy Lungs no rales or rhonchi Heart regular rate and rhythm Abd soft, nontender, positive bowel sounds MSK no focal spinal tenderness, no upper extremity lymphedema Neuro: nonfocal, well oriented, appropriate affect Breasts: The right breast is status post lumpectomy.  There is no evidence of local recurrence.  Careful palpation of the entire left breast including the area where Kelsey Henry thought she felt a change  was unremarkable.  Both axillae are benign.  LAB RESULTS:  CMP     Component Value Date/Time   NA 141 07/28/2017 1121   NA 142 02/18/2017 1221   K 3.8 07/28/2017 1121   K 4.6 02/18/2017 1221   CL 108 07/28/2017 1121   CO2 26 07/28/2017 1121   CO2 27 02/18/2017 1221   GLUCOSE 83 07/28/2017 1121   GLUCOSE 95 02/18/2017 1221   BUN 9 07/28/2017 1121   BUN 18.4 02/18/2017 1221   CREATININE 0.80 07/28/2017 1121   CREATININE 1.0 02/18/2017 1221   CALCIUM 10.1 07/28/2017 1121   CALCIUM 10.3 02/18/2017 1221   PROT 7.1 07/28/2017 1121   PROT 8.3 02/18/2017 1221   ALBUMIN 4.1 07/28/2017 1121   ALBUMIN 4.4 02/18/2017 1221   AST 21 07/28/2017 1121   AST 22 02/18/2017 1221   ALT 19 07/28/2017 1121   ALT 18 02/18/2017 1221     ALKPHOS 80 07/28/2017 1121   ALKPHOS 89 02/18/2017 1221   BILITOT 0.4 07/28/2017 1121   BILITOT 0.48 02/18/2017 1221   GFRNONAA >60 07/28/2017 1121   GFRAA >60 07/28/2017 1121    No results found for: TOTALPROTELP, ALBUMINELP, A1GS, A2GS, BETS, BETA2SER, GAMS, MSPIKE, SPEI  No results found for: Nils Pyle, [facility]  Lab Results  Component Value Date   WBC 6.3 07/28/2017   NEUTROABS 4.6 07/28/2017   HGB 11.4 (L) 07/28/2017   HCT 33.8 (L) 07/28/2017   MCV 91.6 07/28/2017   PLT 306 07/28/2017      Chemistry      Component Value Date/Time   NA 141 07/28/2017 1121   NA 142 02/18/2017 1221   K 3.8 07/28/2017 1121   K 4.6 02/18/2017 1221   CL 108 07/28/2017 1121   CO2 26 07/28/2017 1121   CO2 27 02/18/2017 1221   BUN 9 07/28/2017 1121   BUN 18.4 02/18/2017 1221   CREATININE 0.80 07/28/2017 1121   CREATININE 1.0 02/18/2017 1221      Component Value Date/Time   CALCIUM 10.1 07/28/2017 1121   CALCIUM 10.3 02/18/2017 1221   ALKPHOS 80 07/28/2017 1121   ALKPHOS 89 02/18/2017 1221   AST 21 07/28/2017 1121   AST 22 02/18/2017 1221   ALT 19 07/28/2017 1121   ALT 18 02/18/2017 1221   BILITOT 0.4 07/28/2017 1121   BILITOT 0.48 02/18/2017 1221       No results found for: LABCA2  No components found for: SVXBLT903  No results for input(s): INR in the last 168 hours.  No results found for: LABCA2  No results found for: ESP233  No results found for: AQT622  No results found for: QJF354  No results found for: CA2729  No components found for: HGQUANT  No results found for: CEA1 / No results found for: CEA1   No results found for: AFPTUMOR  No results found for: CHROMOGRNA  No results found for: PSA1  No visits with results within 3 Day(s) from this visit.  Latest known visit with results is:  Appointment on 07/28/2017  Component Date Value Ref Range Status  . Sodium 07/28/2017 141  136 - 145 mmol/L Final  . Potassium 07/28/2017 3.8  3.5  - 5.1 mmol/L Final  . Chloride 07/28/2017 108  98 - 109 mmol/L Final  . CO2 07/28/2017 26  22 - 29 mmol/L Final  . Glucose, Bld 07/28/2017 83  70 - 140 mg/dL Final  . BUN 07/28/2017 9  7 - 26 mg/dL Final  .  Creatinine 07/28/2017 0.80  0.60 - 1.10 mg/dL Final  . Calcium 07/28/2017 10.1  8.4 - 10.4 mg/dL Final  . Total Protein 07/28/2017 7.1  6.4 - 8.3 g/dL Final  . Albumin 07/28/2017 4.1  3.5 - 5.0 g/dL Final  . AST 07/28/2017 21  5 - 34 U/L Final  . ALT 07/28/2017 19  0 - 55 U/L Final  . Alkaline Phosphatase 07/28/2017 80  40 - 150 U/L Final  . Total Bilirubin 07/28/2017 0.4  0.2 - 1.2 mg/dL Final  . GFR, Est Non Af Am 07/28/2017 >60  >60 mL/min Final  . GFR, Est AFR Am 07/28/2017 >60  >60 mL/min Final   Comment: (NOTE) The eGFR has been calculated using the CKD EPI equation. This calculation has not been validated in all clinical situations. eGFR's persistently <60 mL/min signify possible Chronic Kidney Disease.   Kelsey Henry 07/28/2017 7  3 - 11 Final   Performed at [facility], [place] [address] [place], [place] [address]  . WBC Count 07/28/2017 6.3  3.9 - 10.3 K/uL Final  . RBC 07/28/2017 3.69* 3.70 - 5.45 MIL/uL Final  . Hemoglobin 07/28/2017 11.4* 11.6 - 15.9 g/dL Final  . HCT 07/28/2017 33.8* 34.8 - 46.6 % Final  . MCV 07/28/2017 91.6  79.5 - 101.0 fL Final  . MCH 07/28/2017 30.8  25.1 - 34.0 pg Final  . MCHC 07/28/2017 33.7  31.5 - 36.0 g/dL Final  . RDW 07/28/2017 15.9* 11.2 - 14.5 % Final  . Platelet Count 07/28/2017 306  145 - 400 K/uL Final  . Neutrophils Relative % 07/28/2017 74  % Final  . Neutro Abs 07/28/2017 4.6  1.5 - 6.5 K/uL Final  . Lymphocytes Relative 07/28/2017 15  % Final  . Lymphs Abs 07/28/2017 0.9  0.9 - 3.3 K/uL Final  . Monocytes Relative 07/28/2017 8  % Final  . Monocytes Absolute 07/28/2017 0.5  0.1 - 0.9 K/uL Final  . Eosinophils Relative 07/28/2017 2  % Final  . Eosinophils Absolute 07/28/2017 0.1  0.0 - 0.5 K/uL Final    . Basophils Relative 07/28/2017 1  % Final  . Basophils Absolute 07/28/2017 0.1  0.0 - 0.1 K/uL Final   Performed at [facility], [place] [address], [place], [place] [address]    (this displays the last labs from the last 3 days)  No results found for: TOTALPROTELP, ALBUMINELP, A1GS, A2GS, BETS, BETA2SER, GAMS, MSPIKE, SPEI (this displays SPEP labs)  No results found for: KPAFRELGTCHN, LAMBDASER, KAPLAMBRATIO (kappa/lambda light chains)  No results found for: HGBA, HGBA2QUANT, HGBFQUANT, HGBSQUAN (Hemoglobinopathy evaluation)   No results found for: LDH  No results found for: IRON, TIBC, IRONPCTSAT (Iron and TIBC)  No results found for: FERRITIN  Urinalysis No results found for: COLORURINE, APPEARANCEUR, LABSPEC, PHURINE, GLUCOSEU, HGBUR, BILIRUBINUR, KETONESUR, PROTEINUR, UROBILINOGEN, NITRITE, LEUKOCYTESUR   STUDIES: No results found.  ELIGIBLE FOR AVAILABLE RESEARCH PROTOCOL: UPBEAT; did not qualify for BR 003  ASSESSMENT: 52 y.o. [place] woman status post right breast lower inner quadrant biopsy February 09, 2017 for a clinical T1b N0, stage IB invasive ductal carcinoma, grade 3, triple negative  (1) status post right lumpectomy 03/23/2017 for a pT1b pN0, stage IB invasive ductal carcinoma, grade 3, with negative margins.  (2) adjuvant chemotherapy consisting of cyclophosphamide and doxorubicin in dose dense fashion x4 started 04/28/2017, completed 06/09/2017,  followed by weekly paclitaxel with 12 doses planned, starting 06/23/2017,   (a) paclitaxel discontinued after 4 doses  because of neuropathy; last dose 07/14/2017 (  (3) adjuvant radiation pending  (4) genetics testing 02/19/2017 through the STAT Breast panel with reflext to Multi-Cancer panel (83 genes) @ [facility] - No pathogenic mutations detected in ALK, APC, ATM, AXIN2, BAP1, BARD1, BLM, BMPR1A, BRCA1, BRCA2, BRIP1, CASR, CDC73, CDH1, CDK4, CDKN1B, CDKN1C, CDKN2A, CEBPA, CHEK2, CTNNA1,  DICER1, DIS3L2, EGFR, EPCAM, FH, FLCN, GATA2, GPC3, GREM1, HOXB13, HRAS, KIT, MAX, MEN1, MET, MITF, MLH1, MSH2, MSH3, MSH6, MUTYH, NBN, NF1, NF2, NTHL1, PALB2, PDGFRA, PHOX2B, PMS2, POLD1, POLE, POT1, PRKAR1A, PTCH1, PTEN, RAD50, RAD51C, RAD51D, RB1, RECQL4, RET, RUNX1, SDHA, SDHAF2, SDHB, SDHC, SDHD, SMAD4, SMARCA4, SMARCB1, SMARCE1, STK11, SUFU, TERC, TERT, TMEM127, TP53, TSC1, TSC2, VHL, WRN, WT1.  (a) Variants of Uncertain Significance in PDGFRA c.3040G>A (p.Ala1014Thr) and SMARCA4 c.2044C>G (p.Leu682Val) noted  PLAN: Gelisa is recovering nicely from the chemotherapy and is ready to start her radiation treatments next week.  I reassured her that I do not find anything abnormal in the left breast.  I think she strained a little bit by being quite aggressive in her exercises.  I do not think she needs a left mammogram or ultrasound at this point, and in fact we do not want to do those for at least 2 months after she completes her radiation, to give the right breast time to find its new baseline so we do not have changes when we obtain the mammography subsequently  I reassured her that she can call us with any questions or concerns at any point.  She has already signed up for the next [facility] course sometime in August  She knows to call for any other issues that may develop before her next visit.    Magrinat, Virgie Dad, MD  08/24/17 12:06 PM Medical Oncology and Hematology [facility] [address] [place], [place] [address] Tel. [phone]    Fax. [phone]  This document serves as a record of services personally performed by Chauncey Cruel, MD. It was created on his behalf by Margit Banda, a trained medical scribe. The creation of this record is based on the scribe's personal observations and the provider's statements to them.   I have reviewed the above documentation for accuracy and completeness, and I agree with the above.

## 2017-08-24 ENCOUNTER — Telehealth: Payer: Self-pay | Admitting: Oncology

## 2017-08-24 ENCOUNTER — Inpatient Hospital Stay: Payer: BLUE CROSS/BLUE SHIELD | Attending: Oncology | Admitting: Oncology

## 2017-08-24 VITALS — BP 139/56 | HR 96 | Temp 98.6°F | Resp 18 | Ht 61.0 in | Wt 126.1 lb

## 2017-08-24 DIAGNOSIS — C50311 Malignant neoplasm of lower-inner quadrant of right female breast: Secondary | ICD-10-CM | POA: Diagnosis not present

## 2017-08-24 DIAGNOSIS — Z171 Estrogen receptor negative status [ER-]: Secondary | ICD-10-CM | POA: Diagnosis not present

## 2017-08-24 NOTE — Telephone Encounter (Signed)
Gave pt avs and calendar with appts per 5/13 los.  °

## 2017-08-25 ENCOUNTER — Other Ambulatory Visit: Payer: BLUE CROSS/BLUE SHIELD

## 2017-08-25 ENCOUNTER — Ambulatory Visit: Payer: BLUE CROSS/BLUE SHIELD | Admitting: Oncology

## 2017-08-25 ENCOUNTER — Ambulatory Visit: Payer: BLUE CROSS/BLUE SHIELD

## 2017-08-27 ENCOUNTER — Ambulatory Visit
Admission: RE | Admit: 2017-08-27 | Discharge: 2017-08-27 | Disposition: A | Discharge status: Home / Self Care | Payer: BLUE CROSS/BLUE SHIELD | Source: Ambulatory Visit | Attending: Radiation Oncology | Admitting: Radiation Oncology

## 2017-08-27 DIAGNOSIS — C50311 Malignant neoplasm of lower-inner quadrant of right female breast: Secondary | ICD-10-CM | POA: Diagnosis not present

## 2017-08-27 DIAGNOSIS — Z171 Estrogen receptor negative status [ER-]: Secondary | ICD-10-CM | POA: Diagnosis not present

## 2017-08-27 DIAGNOSIS — Z510 Encounter for antineoplastic radiation therapy: Secondary | ICD-10-CM | POA: Insufficient documentation

## 2017-08-27 NOTE — Progress Notes (Signed)
  Radiation Oncology         [phone] ________________________________  Name: Kelsey Henry MRN: [id number]  Date: 08/27/2017  DOB: [birth year]  DIAGNOSIS:     ICD-10-CM   1. Malignant neoplasm of lower-inner quadrant of right breast of female, estrogen receptor negative ([place]) C50.311    Z17.1      SIMULATION AND TREATMENT PLANNING NOTE  The patient presented for simulation prior to beginning her course of radiation treatment for her diagnosis of right-sided breast cancer. The patient was placed in a supine position on a breast board. A customized vac-lock bag was constructed and this complex treatment device will be used on a daily basis during her treatment. In this fashion, a CT scan was obtained through the chest area and an isocenter was placed near the chest wall within the breast.  The patient will be planned to receive a course of radiation initially to a dose of 50.4 Gy. This will consist of a whole breast radiotherapy technique. To accomplish this, 2 customized blocks have been designed which will correspond to medial and lateral whole breast tangent fields. This treatment will be accomplished at 1.8 Gy per fraction. A forward planning technique will also be evaluated to determine if this approach improves the plan. It is anticipated that the patient will then receive a 10 Gy boost to the seroma cavity which has been contoured. This will be accomplished at 2 Gy per fraction.   This initial treatment will consist of a 3-D conformal technique. The seroma has been contoured as the primary target structure. Additionally, dose volume histograms of both this target as well as the lungs and heart will also be evaluated. Such an approach is necessary to ensure that the target area is adequately covered while the nearby critical  normal structures are adequately spared.  Plan:  The final anticipated total dose therefore will correspond to 60.4  Gy.    _______________________________   Jodelle Gross, MD, PhD

## 2017-08-27 NOTE — Progress Notes (Signed)
  Radiation Oncology         [phone] ________________________________  Name: Kelsey Henry MRN: [id number]  Date: 08/27/2017  DOB: [birth year]  Optical Surface Tracking Plan:  Since intensity modulated radiotherapy (IMRT) and 3D conformal radiation treatment methods are predicated on accurate and precise positioning for treatment, intrafraction motion monitoring is medically necessary to ensure accurate and safe treatment delivery.  The ability to quantify intrafraction motion without excessive ionizing radiation dose can only be performed with optical surface tracking. Accordingly, surface imaging offers the opportunity to obtain 3D measurements of patient position throughout IMRT and 3D treatments without excessive radiation exposure.  I am ordering optical surface tracking for this patient's upcoming course of radiotherapy. ________________________________  Kyung Rudd, MD 08/27/2017 4:43 PM    Reference:   Ursula Alert, J, et al. Surface imaging-based analysis of intrafraction motion for breast radiotherapy patients.Journal of [place], n. 6, nov. 2014. ISSN [id number].   Available at: <http://www.jacmp.org/index.php/jacmp/article/view/4957>.

## 2017-09-01 ENCOUNTER — Ambulatory Visit: Payer: BLUE CROSS/BLUE SHIELD

## 2017-09-01 ENCOUNTER — Other Ambulatory Visit: Payer: BLUE CROSS/BLUE SHIELD

## 2017-09-01 ENCOUNTER — Ambulatory Visit: Payer: BLUE CROSS/BLUE SHIELD | Admitting: Oncology

## 2017-09-02 DIAGNOSIS — C50311 Malignant neoplasm of lower-inner quadrant of right female breast: Secondary | ICD-10-CM | POA: Diagnosis not present

## 2017-09-02 DIAGNOSIS — Z171 Estrogen receptor negative status [ER-]: Secondary | ICD-10-CM | POA: Diagnosis not present

## 2017-09-02 DIAGNOSIS — Z510 Encounter for antineoplastic radiation therapy: Secondary | ICD-10-CM | POA: Diagnosis not present

## 2017-09-03 ENCOUNTER — Ambulatory Visit
Admission: RE | Admit: 2017-09-03 | Discharge: 2017-09-03 | Disposition: A | Discharge status: Home / Self Care | Payer: BLUE CROSS/BLUE SHIELD | Source: Ambulatory Visit | Attending: Radiation Oncology | Admitting: Radiation Oncology

## 2017-09-03 DIAGNOSIS — Z510 Encounter for antineoplastic radiation therapy: Secondary | ICD-10-CM | POA: Diagnosis not present

## 2017-09-03 DIAGNOSIS — Z171 Estrogen receptor negative status [ER-]: Principal | ICD-10-CM

## 2017-09-03 DIAGNOSIS — C50311 Malignant neoplasm of lower-inner quadrant of right female breast: Secondary | ICD-10-CM

## 2017-09-03 MED ORDER — RADIAPLEXRX EX GEL
Freq: Once | CUTANEOUS | Status: AC
  Administered 2017-09-03: 17:00:00 via TOPICAL

## 2017-09-03 NOTE — Progress Notes (Signed)
Pt here for patient teaching.  Pt given Radiation and You booklet, skin care instructions and Radiaplex gel.  Reviewed areas of pertinence such as fatigue, hair loss, skin changes, breast tenderness and breast swelling . Pt able to give teach back of to pat skin and use unscented/gentle soap,apply Radiaplex bid, avoid applying anything to skin within 4 hours of treatment, avoid wearing an under wire bra and to use an electric razor if they must shave. Pt verbalizes understanding of information given and will contact nursing with any questions or concerns.     Cori Razor, RN

## 2017-09-04 ENCOUNTER — Ambulatory Visit
Admission: RE | Admit: 2017-09-04 | Discharge: 2017-09-04 | Disposition: A | Discharge status: Home / Self Care | Payer: BLUE CROSS/BLUE SHIELD | Source: Ambulatory Visit | Attending: Radiation Oncology | Admitting: Radiation Oncology

## 2017-09-04 DIAGNOSIS — C50311 Malignant neoplasm of lower-inner quadrant of right female breast: Secondary | ICD-10-CM | POA: Diagnosis not present

## 2017-09-04 DIAGNOSIS — Z510 Encounter for antineoplastic radiation therapy: Secondary | ICD-10-CM | POA: Diagnosis not present

## 2017-09-04 DIAGNOSIS — Z171 Estrogen receptor negative status [ER-]: Secondary | ICD-10-CM | POA: Diagnosis not present

## 2017-09-08 ENCOUNTER — Other Ambulatory Visit: Payer: BLUE CROSS/BLUE SHIELD

## 2017-09-08 ENCOUNTER — Ambulatory Visit: Payer: BLUE CROSS/BLUE SHIELD

## 2017-09-08 ENCOUNTER — Ambulatory Visit
Admission: RE | Admit: 2017-09-08 | Discharge: 2017-09-08 | Disposition: A | Discharge status: Home / Self Care | Payer: BLUE CROSS/BLUE SHIELD | Source: Ambulatory Visit | Attending: Radiation Oncology | Admitting: Radiation Oncology

## 2017-09-08 ENCOUNTER — Ambulatory Visit: Payer: BLUE CROSS/BLUE SHIELD | Admitting: Adult Health

## 2017-09-08 DIAGNOSIS — C50311 Malignant neoplasm of lower-inner quadrant of right female breast: Secondary | ICD-10-CM | POA: Diagnosis not present

## 2017-09-08 DIAGNOSIS — Z510 Encounter for antineoplastic radiation therapy: Secondary | ICD-10-CM | POA: Diagnosis not present

## 2017-09-08 DIAGNOSIS — Z171 Estrogen receptor negative status [ER-]: Secondary | ICD-10-CM | POA: Diagnosis not present

## 2017-09-09 ENCOUNTER — Ambulatory Visit
Admission: RE | Admit: 2017-09-09 | Discharge: 2017-09-09 | Disposition: A | Discharge status: Home / Self Care | Payer: BLUE CROSS/BLUE SHIELD | Source: Ambulatory Visit | Attending: Radiation Oncology | Admitting: Radiation Oncology

## 2017-09-09 DIAGNOSIS — Z510 Encounter for antineoplastic radiation therapy: Secondary | ICD-10-CM | POA: Diagnosis not present

## 2017-09-09 DIAGNOSIS — C50311 Malignant neoplasm of lower-inner quadrant of right female breast: Secondary | ICD-10-CM | POA: Diagnosis not present

## 2017-09-09 DIAGNOSIS — Z171 Estrogen receptor negative status [ER-]: Secondary | ICD-10-CM | POA: Diagnosis not present

## 2017-09-10 ENCOUNTER — Ambulatory Visit
Admission: RE | Admit: 2017-09-10 | Discharge: 2017-09-10 | Disposition: A | Discharge status: Home / Self Care | Payer: BLUE CROSS/BLUE SHIELD | Source: Ambulatory Visit | Attending: Radiation Oncology | Admitting: Radiation Oncology

## 2017-09-10 DIAGNOSIS — Z510 Encounter for antineoplastic radiation therapy: Secondary | ICD-10-CM | POA: Diagnosis not present

## 2017-09-10 DIAGNOSIS — C50311 Malignant neoplasm of lower-inner quadrant of right female breast: Secondary | ICD-10-CM | POA: Diagnosis not present

## 2017-09-10 DIAGNOSIS — Z171 Estrogen receptor negative status [ER-]: Secondary | ICD-10-CM | POA: Diagnosis not present

## 2017-09-11 ENCOUNTER — Ambulatory Visit
Admission: RE | Admit: 2017-09-11 | Discharge: 2017-09-11 | Disposition: A | Discharge status: Home / Self Care | Payer: BLUE CROSS/BLUE SHIELD | Source: Ambulatory Visit | Attending: Radiation Oncology | Admitting: Radiation Oncology

## 2017-09-11 DIAGNOSIS — Z510 Encounter for antineoplastic radiation therapy: Secondary | ICD-10-CM | POA: Diagnosis not present

## 2017-09-11 DIAGNOSIS — C50311 Malignant neoplasm of lower-inner quadrant of right female breast: Secondary | ICD-10-CM | POA: Diagnosis not present

## 2017-09-11 DIAGNOSIS — Z171 Estrogen receptor negative status [ER-]: Secondary | ICD-10-CM | POA: Diagnosis not present

## 2017-09-11 MED ORDER — ALRA NON-METALLIC DEODORANT (RAD-ONC)
1.0000 "application " | Freq: Once | TOPICAL | Status: AC
  Administered 2017-09-11: 1 via TOPICAL

## 2017-09-11 NOTE — Progress Notes (Signed)
[place]  Telephone:(336) [phone] Fax:(336) [phone]     ID: Kelsey Henry DOB: 06-02-1965  MR#: [id number]  GSU#:[id number]  Patient Care Team: Patient, No Pcp Per as PCP - General (General Practice) Magrinat, Virgie Dad, MD as Consulting Physician (Oncology) Kyung Rudd, MD as Consulting Physician (Radiation Oncology) Rolm Bookbinder, MD as Consulting Physician (General Surgery) Meisinger, Sherren Mocha, MD as Consulting Physician (Obstetrics and Gynecology) Loletta Specter, MD (Unknown Physician Specialty) OTHER MD:  CHIEF COMPLAINT: triple negative breast cancer  CURRENT TREATMENT: Adjuvant radiation    HISTORY OF CURRENT ILLNESS: From the original intake note:  Kelsey Henry had routine screening mammography October 2018 showing a possible right breast mass.  She was scheduled for unilateral right mammography and tomography with right breast ultrasonography at the breast center February 06, 2017.  This showed the breast density to be category D.  In the lower inner quadrant of the right breast there was a 0.6 cm oval mass, which by ultrasound measured 0.9 cm and was irregular and hypoechoic.  Ultrasound of the right axilla was sonographically benign.  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  The patient's subsequent history is as detailed below.  INTERVAL HISTORY: Moana returns today for follow-up and treatment of her triple negative breast cancer. She is currently undergoing radiation treatments. She notes that she is tolerating treatments well. She noticed some skin darkening in the area, but overall her skin is fine. She denies having fatigue or pain.    REVIEW OF SYSTEMS: Declan reports that she is doing cardio exercises with  minimal weight lifting. She denies unusual headaches, visual changes, nausea, vomiting, or dizziness. There has been no unusual cough, phlegm production, or pleurisy. This been no change in bowel or bladder habits. She denies unexplained fatigue or unexplained weight loss, bleeding, rash, or fever. A detailed review of systems was otherwise stable.    PAST MEDICAL HISTORY: Past Medical History:  Diagnosis Date  . Cancer [facility])    breast  . Genetic testing 02/19/2017   STAT Breast panel with reflext to Multi-Cancer panel (83 genes) @ [facility] - No pathogenic mutations detected  . GERD (gastroesophageal reflux disease)    no meds  . Seasonal allergies   . SVD (spontaneous vaginal delivery)    x 1    PAST SURGICAL HISTORY: Past Surgical History:  Procedure Laterality Date  . ABDOMINAL HYSTERECTOMY    . BILATERAL SALPINGECTOMY Bilateral 06/23/2012   Procedure: BILATERAL SALPINGECTOMY;  Surgeon: Cheri Fowler, MD;  Location: [facility];  Service: Gynecology;  Laterality: Bilateral;  . BREAST LUMPECTOMY WITH RADIOACTIVE SEED AND SENTINEL LYMPH NODE BIOPSY Right 03/23/2017   Procedure: BREAST LUMPECTOMY WITH RADIOACTIVE SEED AND SENTINEL LYMPH NODE BIOPSY;  Surgeon: Rolm Bookbinder, MD;  Location: [place];  Service: General;  Laterality: Right;  . LAPAROSCOPIC SUPRACERVICAL HYSTERECTOMY N/A 06/23/2012   Procedure: LAPAROSCOPIC SUPRACERVICAL HYSTERECTOMY;  Surgeon: Cheri Fowler, MD;  Location: [facility] [facility];  Service: Gynecology;  Laterality: N/A;  . PORT-A-CATH REMOVAL N/A 08/13/2017   Procedure: REMOVAL PORT-A-CATH;  Surgeon: Rolm Bookbinder, MD;  Location: [place];  Service: General;  Laterality: N/A;  . PORTACATH PLACEMENT Right 03/23/2017   Procedure: INSERTION PORT-A-CATH;  Surgeon: Rolm Bookbinder, MD;  Location: [place];  Service: General;  Laterality: Right;  .  TUBAL LIGATION    . VULVAR LESION REMOVAL N/A 11/01/2014   Procedure: MONS PUBIS LESION;  Surgeon: Cheri Fowler,  MD;  Location: [facility];  Service: Gynecology;  Laterality: N/A;  local anesthesia with IV sedation if needed  . WISDOM TOOTH EXTRACTION      FAMILY HISTORY No family history on file.The patient has no information regarding her father or his side of the family.  The patient's mother is 50 years old as of November 2018.  The patient has 3 brothers, 2 sisters.  One sister had 2 "benign tumors" removed from the right breast at the age of 6.  A maternal grandfather had prostate cancer in his 49s.  GYNECOLOGIC HISTORY:  Patient's last menstrual period was 05/26/2012. Menarche age 52, first live birth age 52, the patient is GX P1.  She stopped having periods 2012 when she underwent hysterectomy, without salpingo-oophorectomy..  She used hormone replacement for approximately 3 months.  She is used oral contraceptives remotely with no complications.  SOCIAL HISTORY:  Chequita works as an Psychologist, educational for [employer], in the paternal DNA subsection.  Her husband Lennette Bihari is an Clinical biochemist.  Their son [name] is an Producer, television/film/video and lives in [place].  The patient has no grandchildren.  She is a Psychologist, forensic.    ADVANCED DIRECTIVES: Not in place   HEALTH MAINTENANCE: Social History   Tobacco Use  . Smoking status: Never Smoker  . Smokeless tobacco: Never Used  Substance Use Topics  . Alcohol use: No  . Drug use: No     Colonoscopy: n/a  PAP: Status post hysterectomy  Bone density: n/a   Allergies  Allergen Reactions  . Aspirin Hives    Current Outpatient Medications  Medication Sig Dispense Refill  . docusate sodium (STOOL SOFTENER) 100 MG capsule Take 100 mg by mouth 2 (two) times daily.    Marland Kitchen loratadine (CLARITIN) 10 MG tablet Take 10 mg by mouth daily as needed for allergies.    . Multiple Vitamins-Minerals (MULTIVITAMIN ADULT) CHEW Chew 2 % by mouth.    Marland Kitchen omeprazole (PRILOSEC) 40 MG capsule TAKE 1 CAPSULE BY MOUTH EVERY DAY 90 capsule 0  . vitamin C (ASCORBIC ACID) 250 MG tablet Take 250 mg  by mouth daily.     No current facility-administered medications for this visit.     OBJECTIVE: Middle-aged African-American woman in no acute distress  Vitals:   09/15/17 1035  BP: (!) 151/97  Pulse: 79  Resp: 18  Temp: 98.2 F (36.8 C)  SpO2: 100%     Body mass index is 24.3 kg/m.   Wt Readings from Last 3 Encounters:  09/15/17 128 lb 9.6 oz (58.3 kg)  08/24/17 126 lb 1.6 oz (57.2 kg)  08/13/17 127 lb (57.6 kg)      ECOG FS:1 - Symptomatic but completely ambulatory  Sclerae unicteric, pupils round and equal Oropharynx clear and moist No cervical or supraclavicular adenopathy Lungs no rales or rhonchi Heart regular rate and rhythm Abd soft, nontender, positive bowel sounds MSK no focal spinal tenderness, no upper extremity lymphedema Neuro: nonfocal, well oriented, appropriate affect Breasts: The right breast is status post lumpectomy and is currently receiving radiation.  There is minimal hyperpigmentation.  The left breast is benign.  Both axillae are benign.   LAB RESULTS:  CMP     Component Value Date/Time   NA 140 09/15/2017 1008   NA 142 02/18/2017 1221   K 4.7 09/15/2017 1008   K 4.6 02/18/2017 1221  CL 103 09/15/2017 1008   CO2 27 09/15/2017 1008   CO2 27 02/18/2017 1221   GLUCOSE 85 09/15/2017 1008   GLUCOSE 95 02/18/2017 1221   BUN 14 09/15/2017 1008   BUN 18.4 02/18/2017 1221   CREATININE 1.02 09/15/2017 1008   CREATININE 1.0 02/18/2017 1221   CALCIUM 10.4 09/15/2017 1008   CALCIUM 10.3 02/18/2017 1221   PROT 7.9 09/15/2017 1008   PROT 8.3 02/18/2017 1221   ALBUMIN 4.6 09/15/2017 1008   ALBUMIN 4.4 02/18/2017 1221   AST 27 09/15/2017 1008   AST 22 02/18/2017 1221   ALT 19 09/15/2017 1008   ALT 18 02/18/2017 1221   ALKPHOS 95 09/15/2017 1008   ALKPHOS 89 02/18/2017 1221   BILITOT 0.5 09/15/2017 1008   BILITOT 0.48 02/18/2017 1221   GFRNONAA >60 09/15/2017 1008   GFRAA >60 09/15/2017 1008    No results found for: TOTALPROTELP,  ALBUMINELP, A1GS, A2GS, BETS, BETA2SER, GAMS, MSPIKE, SPEI  No results found for: KPAFRELGTCHN, LAMBDASER, [facility]  Lab Results  Component Value Date   WBC 5.5 09/15/2017   NEUTROABS 3.6 09/15/2017   HGB 13.7 09/15/2017   HCT 41.2 09/15/2017   MCV 89.4 09/15/2017   PLT 246 09/15/2017      Chemistry      Component Value Date/Time   NA 140 09/15/2017 1008   NA 142 02/18/2017 1221   K 4.7 09/15/2017 1008   K 4.6 02/18/2017 1221   CL 103 09/15/2017 1008   CO2 27 09/15/2017 1008   CO2 27 02/18/2017 1221   BUN 14 09/15/2017 1008   BUN 18.4 02/18/2017 1221   CREATININE 1.02 09/15/2017 1008   CREATININE 1.0 02/18/2017 1221      Component Value Date/Time   CALCIUM 10.4 09/15/2017 1008   CALCIUM 10.3 02/18/2017 1221   ALKPHOS 95 09/15/2017 1008   ALKPHOS 89 02/18/2017 1221   AST 27 09/15/2017 1008   AST 22 02/18/2017 1221   ALT 19 09/15/2017 1008   ALT 18 02/18/2017 1221   BILITOT 0.5 09/15/2017 1008   BILITOT 0.48 02/18/2017 1221       No results found for: LABCA2  No components found for: [id number]  No results for input(s): INR in the last 168 hours.  No results found for: LABCA2  No results found for: JOI786  No results found for: VEH209  No results found for: OBS962  No results found for: CA2729  No components found for: HGQUANT  No results found for: CEA1 / No results found for: CEA1   No results found for: AFPTUMOR  No results found for: CHROMOGRNA  No results found for: PSA1  Appointment on 09/15/2017  Component Date Value Ref Range Status  . Sodium 09/15/2017 140  136 - 145 mmol/L Final  . Potassium 09/15/2017 4.7  3.5 - 5.1 mmol/L Final  . Chloride 09/15/2017 103  98 - 109 mmol/L Final  . CO2 09/15/2017 27  22 - 29 mmol/L Final  . Glucose, Bld 09/15/2017 85  70 - 140 mg/dL Final  . BUN 09/15/2017 14  7 - 26 mg/dL Final  . Creatinine 09/15/2017 1.02  0.60 - 1.10 mg/dL Final  . Calcium 09/15/2017 10.4  8.4 - 10.4 mg/dL Final  . Total  Protein 09/15/2017 7.9  6.4 - 8.3 g/dL Final  . Albumin 09/15/2017 4.6  3.5 - 5.0 g/dL Final  . AST 09/15/2017 27  5 - 34 U/L Final  . ALT 09/15/2017 19  0 - 55 U/L Final  .  Alkaline Phosphatase 09/15/2017 95  40 - 150 U/L Final  . Total Bilirubin 09/15/2017 0.5  0.2 - 1.2 mg/dL Final  . GFR, Est Non Af Am 09/15/2017 >60  >60 mL/min Final  . GFR, Est AFR Am 09/15/2017 >60  >60 mL/min Final   Comment: (NOTE) The eGFR has been calculated using the CKD EPI equation. This calculation has not been validated in all clinical situations. eGFR's persistently <60 mL/min signify possible Chronic Kidney Disease.   Georgiann Hahn gap 09/15/2017 10  3 - 11 Final   Performed at [facility], [place] [address] [place], [place] [address]  . WBC Count 09/15/2017 5.5  3.9 - 10.3 K/uL Final  . RBC 09/15/2017 4.61  3.70 - 5.45 MIL/uL Final  . Hemoglobin 09/15/2017 13.7  11.6 - 15.9 g/dL Final  . HCT 09/15/2017 41.2  34.8 - 46.6 % Final  . MCV 09/15/2017 89.4  79.5 - 101.0 fL Final  . MCH 09/15/2017 29.7  25.1 - 34.0 pg Final  . MCHC 09/15/2017 33.3  31.5 - 36.0 g/dL Final  . RDW 09/15/2017 12.8  11.2 - 14.5 % Final  . Platelet Count 09/15/2017 246  145 - 400 K/uL Final  . Neutrophils Relative % 09/15/2017 67  % Final  . Neutro Abs 09/15/2017 3.6  1.5 - 6.5 K/uL Final  . Lymphocytes Relative 09/15/2017 23  % Final  . Lymphs Abs 09/15/2017 1.3  0.9 - 3.3 K/uL Final  . Monocytes Relative 09/15/2017 6  % Final  . Monocytes Absolute 09/15/2017 0.3  0.1 - 0.9 K/uL Final  . Eosinophils Relative 09/15/2017 4  % Final  . Eosinophils Absolute 09/15/2017 0.2  0.0 - 0.5 K/uL Final  . Basophils Relative 09/15/2017 0  % Final  . Basophils Absolute 09/15/2017 0.0  0.0 - 0.1 K/uL Final   Performed at [facility], [place] [address] [place], [place] [address]    (this displays the last labs from the last 3 days)  No results found for: TOTALPROTELP, ALBUMINELP, A1GS, A2GS,  BETS, BETA2SER, GAMS, MSPIKE, SPEI (this displays SPEP labs)  No results found for: KPAFRELGTCHN, LAMBDASER, KAPLAMBRATIO (kappa/lambda light chains)  No results found for: HGBA, HGBA2QUANT, HGBFQUANT, HGBSQUAN (Hemoglobinopathy evaluation)   No results found for: LDH  No results found for: IRON, TIBC, IRONPCTSAT (Iron and TIBC)  No results found for: FERRITIN  Urinalysis No results found for: COLORURINE, APPEARANCEUR, LABSPEC, PHURINE, GLUCOSEU, HGBUR, BILIRUBINUR, KETONESUR, PROTEINUR, UROBILINOGEN, NITRITE, LEUKOCYTESUR   STUDIES: No results found.  ELIGIBLE FOR AVAILABLE RESEARCH PROTOCOL: UPBEAT; did not qualify for BR 003  ASSESSMENT: 52 y.o. [place] woman status post right breast lower inner quadrant biopsy February 09, 2017 for a clinical T1b N0, stage IB invasive ductal carcinoma, grade 3, triple negative  (1) status post right lumpectomy 03/23/2017 for a pT1b pN0, stage IB invasive ductal carcinoma, grade 3, with negative margins.  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; last dose 07/14/2017   (3) adjuvant radiation to be completed 10/21/2017  (4) genetics testing 02/19/2017 through the STAT Breast panel with reflext to Multi-Cancer panel (83 genes) @ [facility] - No pathogenic mutations detected in ALK, APC, ATM, AXIN2, BAP1, BARD1, BLM, BMPR1A, BRCA1, BRCA2, BRIP1, CASR, CDC73, CDH1, CDK4, CDKN1B, CDKN1C, CDKN2A, CEBPA, CHEK2, CTNNA1, DICER1, DIS3L2, EGFR,  EPCAM, FH, FLCN, GATA2, GPC3, GREM1, HOXB13, HRAS, KIT, MAX, MEN1, MET, MITF, MLH1, MSH2, MSH3, MSH6, MUTYH, NBN, NF1, NF2, NTHL1, PALB2, PDGFRA, PHOX2B, PMS2, POLD1, POLE, POT1, PRKAR1A, PTCH1, PTEN, RAD50, RAD51C, RAD51D, RB1, RECQL4, RET, RUNX1, SDHA, SDHAF2, SDHB, SDHC, SDHD,  SMAD4, SMARCA4, SMARCB1, SMARCE1, STK11, SUFU, TERC, TERT, TMEM127, TP53, TSC1, TSC2, VHL, WRN, WT1.  (a) Variants of Uncertain Significance in PDGFRA c.3040G>A (p.Ala1014Thr) and SMARCA4 c.2044C>G (p.Leu682Val) noted  PLAN: Shalva has recovered remarkably well from her chemo and is ready to start back to work next week.  She is very excited about that prospect.  We discussed the fact that pain in the breast is generally not related to breast cancer, so she has shooting pains, stinging pains, soreness, or sensitivity she should reassure herself.  If she can reassure herself she will let us know I will be glad to see her as needed regarding that  I am delighted her hair is coming back thickly.  Otherwise the plan is for her to complete her radiation treatments and then return to see me in August.  At that time we will initiate long-term follow-up  She knows to call for any other issues that may develop before the next visit.   Magrinat, Virgie Dad, MD  09/15/17 11:08 AM Medical Oncology and Hematology [facility] [address] [place], [place] [address] Tel. [phone]    Fax. [phone]  Alice Rieger, am acting as scribe for Chauncey Cruel MD.  I, Lurline Del MD, have reviewed the above documentation for accuracy and completeness, and I agree with the above.   on for accuracy and completeness, and I agree with the above.

## 2017-09-14 ENCOUNTER — Ambulatory Visit
Admission: RE | Admit: 2017-09-14 | Discharge: 2017-09-14 | Disposition: A | Discharge status: Home / Self Care | Payer: BLUE CROSS/BLUE SHIELD | Source: Ambulatory Visit | Attending: Radiation Oncology | Admitting: Radiation Oncology

## 2017-09-14 DIAGNOSIS — C50311 Malignant neoplasm of lower-inner quadrant of right female breast: Secondary | ICD-10-CM | POA: Insufficient documentation

## 2017-09-14 DIAGNOSIS — Z510 Encounter for antineoplastic radiation therapy: Secondary | ICD-10-CM | POA: Diagnosis not present

## 2017-09-14 DIAGNOSIS — Z9889 Other specified postprocedural states: Secondary | ICD-10-CM | POA: Diagnosis not present

## 2017-09-14 DIAGNOSIS — Z171 Estrogen receptor negative status [ER-]: Secondary | ICD-10-CM | POA: Insufficient documentation

## 2017-09-15 ENCOUNTER — Other Ambulatory Visit: Payer: BLUE CROSS/BLUE SHIELD

## 2017-09-15 ENCOUNTER — Inpatient Hospital Stay: Payer: BLUE CROSS/BLUE SHIELD

## 2017-09-15 ENCOUNTER — Ambulatory Visit
Admission: RE | Admit: 2017-09-15 | Discharge: 2017-09-15 | Disposition: A | Discharge status: Home / Self Care | Payer: BLUE CROSS/BLUE SHIELD | Source: Ambulatory Visit | Attending: Radiation Oncology | Admitting: Radiation Oncology

## 2017-09-15 ENCOUNTER — Ambulatory Visit: Payer: BLUE CROSS/BLUE SHIELD

## 2017-09-15 ENCOUNTER — Inpatient Hospital Stay: Payer: BLUE CROSS/BLUE SHIELD | Attending: Oncology | Admitting: Oncology

## 2017-09-15 ENCOUNTER — Encounter: Payer: Self-pay | Admitting: *Deleted

## 2017-09-15 VITALS — BP 151/97 | HR 79 | Temp 98.2°F | Resp 18 | Ht 61.0 in | Wt 128.6 lb

## 2017-09-15 DIAGNOSIS — Z171 Estrogen receptor negative status [ER-]: Secondary | ICD-10-CM

## 2017-09-15 DIAGNOSIS — C50311 Malignant neoplasm of lower-inner quadrant of right female breast: Secondary | ICD-10-CM

## 2017-09-15 DIAGNOSIS — Z9889 Other specified postprocedural states: Secondary | ICD-10-CM | POA: Diagnosis not present

## 2017-09-15 DIAGNOSIS — N898 Other specified noninflammatory disorders of vagina: Secondary | ICD-10-CM | POA: Diagnosis not present

## 2017-09-15 DIAGNOSIS — N644 Mastodynia: Secondary | ICD-10-CM | POA: Insufficient documentation

## 2017-09-15 DIAGNOSIS — Z510 Encounter for antineoplastic radiation therapy: Secondary | ICD-10-CM | POA: Diagnosis not present

## 2017-09-15 LAB — CMP (CANCER CENTER ONLY)
ALBUMIN: 4.6 g/dL (ref 3.5–5.0)
ALK PHOS: 95 U/L (ref 40–150)
ALT: 19 U/L (ref 0–55)
ANION GAP: 10 (ref 3–11)
AST: 27 U/L (ref 5–34)
BILIRUBIN TOTAL: 0.5 mg/dL (ref 0.2–1.2)
BUN: 14 mg/dL (ref 7–26)
CALCIUM: 10.4 mg/dL (ref 8.4–10.4)
CO2: 27 mmol/L (ref 22–29)
Chloride: 103 mmol/L (ref 98–109)
Creatinine: 1.02 mg/dL (ref 0.60–1.10)
GFR, Est AFR Am: >60 mL/min (ref >60)
GFR, Estimated: >60 mL/min (ref >60)
GLUCOSE: 85 mg/dL (ref 70–140)
Potassium: 4.7 mmol/L (ref 3.5–5.1)
SODIUM: 140 mmol/L (ref 136–145)
TOTAL PROTEIN: 7.9 g/dL (ref 6.4–8.3)

## 2017-09-15 LAB — CBC WITH DIFFERENTIAL (CANCER CENTER ONLY)
BASOS PCT: 0 %
Basophils Absolute: 0 10*3/uL (ref 0.0–0.1)
EOS PCT: 4 %
Eosinophils Absolute: 0.2 10*3/uL (ref 0.0–0.5)
HEMATOCRIT: 41.2 % (ref 34.8–46.6)
Hemoglobin: 13.7 g/dL (ref 11.6–15.9)
Lymphocytes Relative: 23 %
Lymphs Abs: 1.3 10*3/uL (ref 0.9–3.3)
MCH: 29.7 pg (ref 25.1–34.0)
MCHC: 33.3 g/dL (ref 31.5–36.0)
MCV: 89.4 fL (ref 79.5–101.0)
MONO ABS: 0.3 10*3/uL (ref 0.1–0.9)
MONOS PCT: 6 %
NEUTROS ABS: 3.6 10*3/uL (ref 1.5–6.5)
Neutrophils Relative %: 67 %
PLATELETS: 246 10*3/uL (ref 145–400)
RBC: 4.61 MIL/uL (ref 3.70–5.45)
RDW: 12.8 % (ref 11.2–14.5)
WBC Count: 5.5 10*3/uL (ref 3.9–10.3)

## 2017-09-16 ENCOUNTER — Ambulatory Visit
Admission: RE | Admit: 2017-09-16 | Discharge: 2017-09-16 | Disposition: A | Discharge status: Home / Self Care | Payer: BLUE CROSS/BLUE SHIELD | Source: Ambulatory Visit | Attending: Radiation Oncology | Admitting: Radiation Oncology

## 2017-09-16 DIAGNOSIS — C50311 Malignant neoplasm of lower-inner quadrant of right female breast: Secondary | ICD-10-CM | POA: Diagnosis not present

## 2017-09-16 DIAGNOSIS — Z171 Estrogen receptor negative status [ER-]: Secondary | ICD-10-CM | POA: Diagnosis not present

## 2017-09-16 DIAGNOSIS — Z510 Encounter for antineoplastic radiation therapy: Secondary | ICD-10-CM | POA: Diagnosis not present

## 2017-09-16 DIAGNOSIS — Z9889 Other specified postprocedural states: Secondary | ICD-10-CM | POA: Diagnosis not present

## 2017-09-17 ENCOUNTER — Ambulatory Visit
Admission: RE | Admit: 2017-09-17 | Discharge: 2017-09-17 | Disposition: A | Discharge status: Home / Self Care | Payer: BLUE CROSS/BLUE SHIELD | Source: Ambulatory Visit | Attending: Radiation Oncology | Admitting: Radiation Oncology

## 2017-09-17 DIAGNOSIS — C50311 Malignant neoplasm of lower-inner quadrant of right female breast: Secondary | ICD-10-CM | POA: Diagnosis not present

## 2017-09-17 DIAGNOSIS — Z510 Encounter for antineoplastic radiation therapy: Secondary | ICD-10-CM | POA: Diagnosis not present

## 2017-09-17 DIAGNOSIS — Z171 Estrogen receptor negative status [ER-]: Secondary | ICD-10-CM | POA: Diagnosis not present

## 2017-09-17 DIAGNOSIS — Z9889 Other specified postprocedural states: Secondary | ICD-10-CM | POA: Diagnosis not present

## 2017-09-18 ENCOUNTER — Ambulatory Visit
Admission: RE | Admit: 2017-09-18 | Discharge: 2017-09-18 | Disposition: A | Discharge status: Home / Self Care | Payer: BLUE CROSS/BLUE SHIELD | Source: Ambulatory Visit | Attending: Radiation Oncology | Admitting: Radiation Oncology

## 2017-09-18 DIAGNOSIS — Z9889 Other specified postprocedural states: Secondary | ICD-10-CM | POA: Diagnosis not present

## 2017-09-18 DIAGNOSIS — Z171 Estrogen receptor negative status [ER-]: Secondary | ICD-10-CM | POA: Diagnosis not present

## 2017-09-18 DIAGNOSIS — Z510 Encounter for antineoplastic radiation therapy: Secondary | ICD-10-CM | POA: Diagnosis not present

## 2017-09-18 DIAGNOSIS — C50311 Malignant neoplasm of lower-inner quadrant of right female breast: Secondary | ICD-10-CM | POA: Diagnosis not present

## 2017-09-21 ENCOUNTER — Ambulatory Visit
Admission: RE | Admit: 2017-09-21 | Discharge: 2017-09-21 | Disposition: A | Discharge status: Home / Self Care | Payer: BLUE CROSS/BLUE SHIELD | Source: Ambulatory Visit | Attending: Radiation Oncology | Admitting: Radiation Oncology

## 2017-09-21 DIAGNOSIS — Z9889 Other specified postprocedural states: Secondary | ICD-10-CM | POA: Diagnosis not present

## 2017-09-21 DIAGNOSIS — Z510 Encounter for antineoplastic radiation therapy: Secondary | ICD-10-CM | POA: Diagnosis not present

## 2017-09-21 DIAGNOSIS — C50311 Malignant neoplasm of lower-inner quadrant of right female breast: Secondary | ICD-10-CM | POA: Diagnosis not present

## 2017-09-21 DIAGNOSIS — Z171 Estrogen receptor negative status [ER-]: Secondary | ICD-10-CM | POA: Diagnosis not present

## 2017-09-22 ENCOUNTER — Ambulatory Visit
Admission: RE | Admit: 2017-09-22 | Discharge: 2017-09-22 | Disposition: A | Discharge status: Home / Self Care | Payer: BLUE CROSS/BLUE SHIELD | Source: Ambulatory Visit | Attending: Radiation Oncology | Admitting: Radiation Oncology

## 2017-09-22 DIAGNOSIS — Z510 Encounter for antineoplastic radiation therapy: Secondary | ICD-10-CM | POA: Diagnosis not present

## 2017-09-22 DIAGNOSIS — Z9889 Other specified postprocedural states: Secondary | ICD-10-CM | POA: Diagnosis not present

## 2017-09-22 DIAGNOSIS — Z171 Estrogen receptor negative status [ER-]: Secondary | ICD-10-CM | POA: Diagnosis not present

## 2017-09-22 DIAGNOSIS — C50311 Malignant neoplasm of lower-inner quadrant of right female breast: Secondary | ICD-10-CM | POA: Diagnosis not present

## 2017-09-23 ENCOUNTER — Ambulatory Visit
Admission: RE | Admit: 2017-09-23 | Discharge: 2017-09-23 | Disposition: A | Discharge status: Home / Self Care | Payer: BLUE CROSS/BLUE SHIELD | Source: Ambulatory Visit | Attending: Radiation Oncology | Admitting: Radiation Oncology

## 2017-09-23 DIAGNOSIS — Z510 Encounter for antineoplastic radiation therapy: Secondary | ICD-10-CM | POA: Diagnosis not present

## 2017-09-23 DIAGNOSIS — C50311 Malignant neoplasm of lower-inner quadrant of right female breast: Secondary | ICD-10-CM | POA: Diagnosis not present

## 2017-09-23 DIAGNOSIS — Z9889 Other specified postprocedural states: Secondary | ICD-10-CM | POA: Diagnosis not present

## 2017-09-23 DIAGNOSIS — Z171 Estrogen receptor negative status [ER-]: Secondary | ICD-10-CM | POA: Diagnosis not present

## 2017-09-24 ENCOUNTER — Ambulatory Visit
Admission: RE | Admit: 2017-09-24 | Discharge: 2017-09-24 | Disposition: A | Discharge status: Home / Self Care | Payer: BLUE CROSS/BLUE SHIELD | Source: Ambulatory Visit | Attending: Radiation Oncology | Admitting: Radiation Oncology

## 2017-09-24 DIAGNOSIS — Z171 Estrogen receptor negative status [ER-]: Secondary | ICD-10-CM | POA: Diagnosis not present

## 2017-09-24 DIAGNOSIS — C50311 Malignant neoplasm of lower-inner quadrant of right female breast: Secondary | ICD-10-CM | POA: Diagnosis not present

## 2017-09-24 DIAGNOSIS — Z510 Encounter for antineoplastic radiation therapy: Secondary | ICD-10-CM | POA: Diagnosis not present

## 2017-09-24 DIAGNOSIS — Z9889 Other specified postprocedural states: Secondary | ICD-10-CM | POA: Diagnosis not present

## 2017-09-25 ENCOUNTER — Ambulatory Visit
Admission: RE | Admit: 2017-09-25 | Discharge: 2017-09-25 | Disposition: A | Discharge status: Home / Self Care | Payer: BLUE CROSS/BLUE SHIELD | Source: Ambulatory Visit | Attending: Radiation Oncology | Admitting: Radiation Oncology

## 2017-09-25 DIAGNOSIS — Z171 Estrogen receptor negative status [ER-]: Principal | ICD-10-CM

## 2017-09-25 DIAGNOSIS — Z510 Encounter for antineoplastic radiation therapy: Secondary | ICD-10-CM | POA: Diagnosis not present

## 2017-09-25 DIAGNOSIS — Z9889 Other specified postprocedural states: Secondary | ICD-10-CM | POA: Diagnosis not present

## 2017-09-25 DIAGNOSIS — C50311 Malignant neoplasm of lower-inner quadrant of right female breast: Secondary | ICD-10-CM | POA: Diagnosis not present

## 2017-09-25 MED ORDER — RADIAPLEXRX EX GEL
Freq: Once | CUTANEOUS | Status: AC
  Administered 2017-09-25: 09:00:00 via TOPICAL

## 2017-09-28 ENCOUNTER — Ambulatory Visit
Admission: RE | Admit: 2017-09-28 | Discharge: 2017-09-28 | Disposition: A | Discharge status: Home / Self Care | Payer: BLUE CROSS/BLUE SHIELD | Source: Ambulatory Visit | Attending: Radiation Oncology | Admitting: Radiation Oncology

## 2017-09-28 DIAGNOSIS — Z510 Encounter for antineoplastic radiation therapy: Secondary | ICD-10-CM | POA: Diagnosis not present

## 2017-09-28 DIAGNOSIS — Z171 Estrogen receptor negative status [ER-]: Secondary | ICD-10-CM | POA: Diagnosis not present

## 2017-09-28 DIAGNOSIS — C50311 Malignant neoplasm of lower-inner quadrant of right female breast: Secondary | ICD-10-CM | POA: Diagnosis not present

## 2017-09-28 DIAGNOSIS — Z9889 Other specified postprocedural states: Secondary | ICD-10-CM | POA: Diagnosis not present

## 2017-09-29 ENCOUNTER — Ambulatory Visit
Admission: RE | Admit: 2017-09-29 | Discharge: 2017-09-29 | Disposition: A | Discharge status: Home / Self Care | Payer: BLUE CROSS/BLUE SHIELD | Source: Ambulatory Visit | Attending: Radiation Oncology | Admitting: Radiation Oncology

## 2017-09-29 DIAGNOSIS — C50311 Malignant neoplasm of lower-inner quadrant of right female breast: Secondary | ICD-10-CM | POA: Diagnosis not present

## 2017-09-29 DIAGNOSIS — Z171 Estrogen receptor negative status [ER-]: Secondary | ICD-10-CM | POA: Diagnosis not present

## 2017-09-29 DIAGNOSIS — Z510 Encounter for antineoplastic radiation therapy: Secondary | ICD-10-CM | POA: Diagnosis not present

## 2017-09-29 DIAGNOSIS — Z9889 Other specified postprocedural states: Secondary | ICD-10-CM | POA: Diagnosis not present

## 2017-09-30 ENCOUNTER — Ambulatory Visit
Admission: RE | Admit: 2017-09-30 | Discharge: 2017-09-30 | Disposition: A | Discharge status: Home / Self Care | Payer: BLUE CROSS/BLUE SHIELD | Source: Ambulatory Visit | Attending: Radiation Oncology | Admitting: Radiation Oncology

## 2017-09-30 DIAGNOSIS — C50311 Malignant neoplasm of lower-inner quadrant of right female breast: Secondary | ICD-10-CM | POA: Diagnosis not present

## 2017-09-30 DIAGNOSIS — Z510 Encounter for antineoplastic radiation therapy: Secondary | ICD-10-CM | POA: Diagnosis not present

## 2017-09-30 DIAGNOSIS — Z9889 Other specified postprocedural states: Secondary | ICD-10-CM | POA: Diagnosis not present

## 2017-09-30 DIAGNOSIS — Z171 Estrogen receptor negative status [ER-]: Secondary | ICD-10-CM | POA: Diagnosis not present

## 2017-10-01 ENCOUNTER — Ambulatory Visit
Admission: RE | Admit: 2017-10-01 | Discharge: 2017-10-01 | Disposition: A | Discharge status: Home / Self Care | Payer: BLUE CROSS/BLUE SHIELD | Source: Ambulatory Visit | Attending: Radiation Oncology | Admitting: Radiation Oncology

## 2017-10-01 DIAGNOSIS — C50311 Malignant neoplasm of lower-inner quadrant of right female breast: Secondary | ICD-10-CM | POA: Diagnosis not present

## 2017-10-01 DIAGNOSIS — Z9889 Other specified postprocedural states: Secondary | ICD-10-CM | POA: Diagnosis not present

## 2017-10-01 DIAGNOSIS — Z510 Encounter for antineoplastic radiation therapy: Secondary | ICD-10-CM | POA: Diagnosis not present

## 2017-10-01 DIAGNOSIS — Z171 Estrogen receptor negative status [ER-]: Secondary | ICD-10-CM | POA: Diagnosis not present

## 2017-10-02 ENCOUNTER — Ambulatory Visit
Admission: RE | Admit: 2017-10-02 | Discharge: 2017-10-02 | Disposition: A | Discharge status: Home / Self Care | Payer: BLUE CROSS/BLUE SHIELD | Source: Ambulatory Visit | Attending: Radiation Oncology | Admitting: Radiation Oncology

## 2017-10-02 DIAGNOSIS — C50311 Malignant neoplasm of lower-inner quadrant of right female breast: Secondary | ICD-10-CM

## 2017-10-02 DIAGNOSIS — Z510 Encounter for antineoplastic radiation therapy: Secondary | ICD-10-CM | POA: Diagnosis not present

## 2017-10-02 DIAGNOSIS — Z171 Estrogen receptor negative status [ER-]: Secondary | ICD-10-CM | POA: Diagnosis not present

## 2017-10-02 DIAGNOSIS — Z9889 Other specified postprocedural states: Secondary | ICD-10-CM | POA: Diagnosis not present

## 2017-10-02 MED ORDER — ALRA NON-METALLIC DEODORANT (RAD-ONC)
1.0000 "application " | Freq: Once | TOPICAL | Status: AC
  Administered 2017-10-02: 1 via TOPICAL

## 2017-10-02 MED ORDER — RADIAPLEXRX EX GEL
Freq: Once | CUTANEOUS | Status: AC
  Administered 2017-10-02: 09:00:00 via TOPICAL

## 2017-10-05 ENCOUNTER — Ambulatory Visit
Admission: RE | Admit: 2017-10-05 | Discharge: 2017-10-05 | Disposition: A | Discharge status: Home / Self Care | Payer: BLUE CROSS/BLUE SHIELD | Source: Ambulatory Visit | Attending: Radiation Oncology | Admitting: Radiation Oncology

## 2017-10-05 DIAGNOSIS — C50311 Malignant neoplasm of lower-inner quadrant of right female breast: Secondary | ICD-10-CM | POA: Diagnosis not present

## 2017-10-05 DIAGNOSIS — Z510 Encounter for antineoplastic radiation therapy: Secondary | ICD-10-CM | POA: Diagnosis not present

## 2017-10-05 DIAGNOSIS — Z171 Estrogen receptor negative status [ER-]: Secondary | ICD-10-CM | POA: Diagnosis not present

## 2017-10-05 DIAGNOSIS — Z9889 Other specified postprocedural states: Secondary | ICD-10-CM | POA: Diagnosis not present

## 2017-10-06 ENCOUNTER — Ambulatory Visit: Payer: BLUE CROSS/BLUE SHIELD | Admitting: Oncology

## 2017-10-06 ENCOUNTER — Other Ambulatory Visit: Payer: BLUE CROSS/BLUE SHIELD

## 2017-10-06 ENCOUNTER — Ambulatory Visit
Admission: RE | Admit: 2017-10-06 | Discharge: 2017-10-06 | Disposition: A | Discharge status: Home / Self Care | Payer: BLUE CROSS/BLUE SHIELD | Source: Ambulatory Visit | Attending: Radiation Oncology | Admitting: Radiation Oncology

## 2017-10-06 ENCOUNTER — Ambulatory Visit: Payer: BLUE CROSS/BLUE SHIELD

## 2017-10-06 DIAGNOSIS — C50311 Malignant neoplasm of lower-inner quadrant of right female breast: Secondary | ICD-10-CM | POA: Diagnosis not present

## 2017-10-06 DIAGNOSIS — Z171 Estrogen receptor negative status [ER-]: Secondary | ICD-10-CM | POA: Diagnosis not present

## 2017-10-06 DIAGNOSIS — Z510 Encounter for antineoplastic radiation therapy: Secondary | ICD-10-CM | POA: Diagnosis not present

## 2017-10-06 DIAGNOSIS — Z9889 Other specified postprocedural states: Secondary | ICD-10-CM | POA: Diagnosis not present

## 2017-10-07 ENCOUNTER — Ambulatory Visit
Admission: RE | Admit: 2017-10-07 | Discharge: 2017-10-07 | Disposition: A | Discharge status: Home / Self Care | Payer: BLUE CROSS/BLUE SHIELD | Source: Ambulatory Visit | Attending: Radiation Oncology | Admitting: Radiation Oncology

## 2017-10-07 DIAGNOSIS — Z9889 Other specified postprocedural states: Secondary | ICD-10-CM | POA: Diagnosis not present

## 2017-10-07 DIAGNOSIS — Z171 Estrogen receptor negative status [ER-]: Secondary | ICD-10-CM | POA: Diagnosis not present

## 2017-10-07 DIAGNOSIS — C50311 Malignant neoplasm of lower-inner quadrant of right female breast: Secondary | ICD-10-CM | POA: Diagnosis not present

## 2017-10-07 DIAGNOSIS — Z510 Encounter for antineoplastic radiation therapy: Secondary | ICD-10-CM | POA: Diagnosis not present

## 2017-10-08 ENCOUNTER — Other Ambulatory Visit: Payer: Self-pay | Admitting: Oncology

## 2017-10-08 ENCOUNTER — Ambulatory Visit
Admission: RE | Admit: 2017-10-08 | Discharge: 2017-10-08 | Disposition: A | Discharge status: Home / Self Care | Payer: BLUE CROSS/BLUE SHIELD | Source: Ambulatory Visit | Attending: Radiation Oncology | Admitting: Radiation Oncology

## 2017-10-08 DIAGNOSIS — Z510 Encounter for antineoplastic radiation therapy: Secondary | ICD-10-CM | POA: Diagnosis not present

## 2017-10-08 DIAGNOSIS — Z9889 Other specified postprocedural states: Secondary | ICD-10-CM | POA: Diagnosis not present

## 2017-10-08 DIAGNOSIS — C50311 Malignant neoplasm of lower-inner quadrant of right female breast: Secondary | ICD-10-CM | POA: Diagnosis not present

## 2017-10-08 DIAGNOSIS — Z171 Estrogen receptor negative status [ER-]: Secondary | ICD-10-CM | POA: Diagnosis not present

## 2017-10-09 ENCOUNTER — Ambulatory Visit
Admission: RE | Admit: 2017-10-09 | Discharge: 2017-10-09 | Disposition: A | Discharge status: Home / Self Care | Payer: BLUE CROSS/BLUE SHIELD | Source: Ambulatory Visit | Attending: Radiation Oncology | Admitting: Radiation Oncology

## 2017-10-09 ENCOUNTER — Ambulatory Visit: Payer: BLUE CROSS/BLUE SHIELD | Admitting: Radiation Oncology

## 2017-10-09 DIAGNOSIS — Z510 Encounter for antineoplastic radiation therapy: Secondary | ICD-10-CM | POA: Diagnosis not present

## 2017-10-09 DIAGNOSIS — Z9889 Other specified postprocedural states: Secondary | ICD-10-CM | POA: Diagnosis not present

## 2017-10-09 DIAGNOSIS — Z171 Estrogen receptor negative status [ER-]: Secondary | ICD-10-CM | POA: Diagnosis not present

## 2017-10-09 DIAGNOSIS — C50311 Malignant neoplasm of lower-inner quadrant of right female breast: Secondary | ICD-10-CM | POA: Diagnosis not present

## 2017-10-12 ENCOUNTER — Ambulatory Visit
Admission: RE | Admit: 2017-10-12 | Discharge: 2017-10-12 | Disposition: A | Discharge status: Home / Self Care | Payer: BLUE CROSS/BLUE SHIELD | Source: Ambulatory Visit | Attending: Radiation Oncology | Admitting: Radiation Oncology

## 2017-10-12 DIAGNOSIS — Z510 Encounter for antineoplastic radiation therapy: Secondary | ICD-10-CM | POA: Insufficient documentation

## 2017-10-12 DIAGNOSIS — Z171 Estrogen receptor negative status [ER-]: Secondary | ICD-10-CM | POA: Insufficient documentation

## 2017-10-12 DIAGNOSIS — C50311 Malignant neoplasm of lower-inner quadrant of right female breast: Secondary | ICD-10-CM | POA: Insufficient documentation

## 2017-10-13 ENCOUNTER — Ambulatory Visit
Admission: RE | Admit: 2017-10-13 | Discharge: 2017-10-13 | Disposition: A | Discharge status: Home / Self Care | Payer: BLUE CROSS/BLUE SHIELD | Source: Ambulatory Visit | Attending: Radiation Oncology | Admitting: Radiation Oncology

## 2017-10-13 DIAGNOSIS — Z510 Encounter for antineoplastic radiation therapy: Secondary | ICD-10-CM | POA: Diagnosis not present

## 2017-10-13 DIAGNOSIS — Z171 Estrogen receptor negative status [ER-]: Secondary | ICD-10-CM | POA: Diagnosis not present

## 2017-10-13 DIAGNOSIS — C50311 Malignant neoplasm of lower-inner quadrant of right female breast: Secondary | ICD-10-CM | POA: Diagnosis not present

## 2017-10-14 ENCOUNTER — Ambulatory Visit
Admission: RE | Admit: 2017-10-14 | Discharge: 2017-10-14 | Disposition: A | Discharge status: Home / Self Care | Payer: BLUE CROSS/BLUE SHIELD | Source: Ambulatory Visit | Attending: Radiation Oncology | Admitting: Radiation Oncology

## 2017-10-14 DIAGNOSIS — Z171 Estrogen receptor negative status [ER-]: Secondary | ICD-10-CM | POA: Diagnosis not present

## 2017-10-14 DIAGNOSIS — C50311 Malignant neoplasm of lower-inner quadrant of right female breast: Secondary | ICD-10-CM | POA: Diagnosis not present

## 2017-10-14 DIAGNOSIS — Z510 Encounter for antineoplastic radiation therapy: Secondary | ICD-10-CM | POA: Diagnosis not present

## 2017-10-16 ENCOUNTER — Ambulatory Visit
Admission: RE | Admit: 2017-10-16 | Discharge: 2017-10-16 | Disposition: A | Discharge status: Home / Self Care | Payer: BLUE CROSS/BLUE SHIELD | Source: Ambulatory Visit | Attending: Radiation Oncology | Admitting: Radiation Oncology

## 2017-10-16 DIAGNOSIS — Z171 Estrogen receptor negative status [ER-]: Secondary | ICD-10-CM | POA: Diagnosis not present

## 2017-10-16 DIAGNOSIS — C50311 Malignant neoplasm of lower-inner quadrant of right female breast: Secondary | ICD-10-CM | POA: Diagnosis not present

## 2017-10-16 DIAGNOSIS — Z510 Encounter for antineoplastic radiation therapy: Secondary | ICD-10-CM | POA: Diagnosis not present

## 2017-10-19 ENCOUNTER — Ambulatory Visit
Admission: RE | Admit: 2017-10-19 | Discharge: 2017-10-19 | Disposition: A | Discharge status: Home / Self Care | Payer: BLUE CROSS/BLUE SHIELD | Source: Ambulatory Visit | Attending: Radiation Oncology | Admitting: Radiation Oncology

## 2017-10-19 DIAGNOSIS — Z510 Encounter for antineoplastic radiation therapy: Secondary | ICD-10-CM | POA: Diagnosis not present

## 2017-10-19 DIAGNOSIS — Z171 Estrogen receptor negative status [ER-]: Secondary | ICD-10-CM | POA: Diagnosis not present

## 2017-10-19 DIAGNOSIS — C50311 Malignant neoplasm of lower-inner quadrant of right female breast: Secondary | ICD-10-CM | POA: Diagnosis not present

## 2017-10-20 ENCOUNTER — Ambulatory Visit
Admission: RE | Admit: 2017-10-20 | Discharge: 2017-10-20 | Disposition: A | Discharge status: Home / Self Care | Payer: BLUE CROSS/BLUE SHIELD | Source: Ambulatory Visit | Attending: Radiation Oncology | Admitting: Radiation Oncology

## 2017-10-20 DIAGNOSIS — Z171 Estrogen receptor negative status [ER-]: Secondary | ICD-10-CM | POA: Diagnosis not present

## 2017-10-20 DIAGNOSIS — Z510 Encounter for antineoplastic radiation therapy: Secondary | ICD-10-CM | POA: Diagnosis not present

## 2017-10-20 DIAGNOSIS — C50311 Malignant neoplasm of lower-inner quadrant of right female breast: Secondary | ICD-10-CM | POA: Diagnosis not present

## 2017-10-21 ENCOUNTER — Encounter: Payer: Self-pay | Admitting: Radiation Oncology

## 2017-10-21 ENCOUNTER — Ambulatory Visit
Admission: RE | Admit: 2017-10-21 | Discharge: 2017-10-21 | Disposition: A | Discharge status: Home / Self Care | Payer: BLUE CROSS/BLUE SHIELD | Source: Ambulatory Visit | Attending: Radiation Oncology | Admitting: Radiation Oncology

## 2017-10-21 DIAGNOSIS — Z171 Estrogen receptor negative status [ER-]: Secondary | ICD-10-CM | POA: Diagnosis not present

## 2017-10-21 DIAGNOSIS — Z510 Encounter for antineoplastic radiation therapy: Secondary | ICD-10-CM | POA: Diagnosis not present

## 2017-10-21 DIAGNOSIS — C50311 Malignant neoplasm of lower-inner quadrant of right female breast: Secondary | ICD-10-CM | POA: Diagnosis not present

## 2017-10-26 ENCOUNTER — Other Ambulatory Visit: Payer: Self-pay | Admitting: Oncology

## 2017-10-30 NOTE — Progress Notes (Signed)
FMLA successfully faxed to [facility] at [phone]. Mailed copy to patient address on file.

## 2017-11-04 NOTE — Progress Notes (Signed)
  Radiation Oncology         [phone] ________________________________  Name: Kelsey Henry MRN: [id number]  Date: 10/21/2017  DOB: 13-Nov-1965  End of Treatment Note  Diagnosis:   52 y.o. female with Stage IB, pT1bN0M0 grade 3 triple negative invasive ductal carcinoma of the right breast    Indication for treatment:  Curative       Radiation treatment dates:   09/03/2017 - 10/21/2017  Site/dose:   The patient initially received a dose of 50.4 Gy in 28 fractions to the right breast using whole-breast tangent fields. This was delivered using a 3-D conformal technique. The patient then received a boost to the seroma. This delivered an additional 10 Gy in 5 fractions using a 3-D technique. The total dose was 60.4 Gy.  Narrative: The patient tolerated radiation treatment relatively well.   The patient had some expected skin irritation as she progressed during treatment. Moist desquamation was not present at the end of treatment.  Plan: The patient has completed radiation treatment. The patient will return to radiation oncology clinic for routine followup in one month. I advised the patient to call or return sooner if they have any questions or concerns related to their recovery or treatment. ________________________________  Jodelle Gross, MD, PhD  This document serves as a record of services personally performed by Kyung Rudd, MD. It was created on his behalf by Rae Lips, a trained medical scribe. The creation of this record is based on the scribe's personal observations and the provider's statements to them. This document has been checked and approved by the attending provider.

## 2017-11-30 ENCOUNTER — Ambulatory Visit
Admission: RE | Admit: 2017-11-30 | Discharge: 2017-11-30 | Disposition: A | Discharge status: Home / Self Care | Payer: BLUE CROSS/BLUE SHIELD | Source: Ambulatory Visit | Attending: Radiation Oncology | Admitting: Radiation Oncology

## 2017-11-30 ENCOUNTER — Encounter: Payer: Self-pay | Admitting: Radiation Oncology

## 2017-11-30 ENCOUNTER — Other Ambulatory Visit: Payer: Self-pay

## 2017-11-30 VITALS — BP 128/86 | HR 86 | Temp 98.2°F | Resp 18 | Ht 61.0 in | Wt 126.4 lb

## 2017-11-30 DIAGNOSIS — Z923 Personal history of irradiation: Secondary | ICD-10-CM | POA: Diagnosis not present

## 2017-11-30 DIAGNOSIS — C50311 Malignant neoplasm of lower-inner quadrant of right female breast: Secondary | ICD-10-CM | POA: Insufficient documentation

## 2017-11-30 DIAGNOSIS — Z171 Estrogen receptor negative status [ER-]: Secondary | ICD-10-CM | POA: Diagnosis not present

## 2017-11-30 NOTE — Progress Notes (Signed)
  Radiation Oncology         [phone] ________________________________  Name: Kelsey Henry MRN: [id number]  Date of Service: 11/30/2017  DOB: 1966/04/13  Post Treatment Note  CC: Patient, No Pcp Per  Magrinat, Virgie Dad, MD  Diagnosis:   Stage IB,pT1bN0M0 grade 3 triple negative invasive ductal carcinoma of the right breast   Interval Since Last Radiation:  6 weeks   09/03/2017 - 10/21/2017: The patient initially received a dose of 50.4 Gy in 28 fractions to the right breast using whole-breast tangent fields. This was delivered using a 3-D conformal technique. The patient then received a boost to the seroma. This delivered an additional 10 Gy in 5 fractions using a 3-D technique. The total dose was 60.4 Gy  Narrative:  The patient returns today for routine follow-up. During treatment she did very well with radiotherapy and did not have significant desquamation.                             On review of systems, the patient states she is doing well overall. She reports her skin has healed well. She denies any concerns with desquamation or discoloration.   ALLERGIES:  is allergic to aspirin.  Meds: Current Outpatient Medications  Medication Sig Dispense Refill  . acidophilus (RISAQUAD) CAPS capsule Take by mouth daily.    . vitamin C (ASCORBIC ACID) 250 MG tablet Take 250 mg by mouth daily.    Marland Kitchen loratadine (CLARITIN) 10 MG tablet Take 10 mg by mouth daily as needed for allergies.     No current facility-administered medications for this encounter.     Physical Findings:  height is 5\' 1"  (1.549 m) and weight is 126 lb 6.4 oz (57.3 kg). Her oral temperature is 98.2 F (36.8 C). Her blood pressure is 128/86 and her pulse is 86. Her respiration is 18.  Pain Assessment Pain Score: 0-No pain/10 In general this is a well appearing African American female in no acute distress. She's alert and oriented x4 and appropriate throughout the examination. Cardiopulmonary assessment is  negative for acute distress and she exhibits normal effort. The right breast was examined and reveals well healed skin and no desquamation. Her PAC incision site has a well healed incision that has keloid features. Mild hyperpigmentation is also noted.   Lab Findings: Lab Results  Component Value Date   WBC 5.5 09/15/2017   HGB 13.7 09/15/2017   HCT 41.2 09/15/2017   MCV 89.4 09/15/2017   PLT 246 09/15/2017     Radiographic Findings: No results found.  Impression/Plan: 1. Stage IB,pT1bN0M0 grade 3 triple negative invasive ductal carcinoma of the right breast. The patient has been doing well since completion of radiotherapy. We discussed that we would be happy to continue to follow her as needed, but she will also continue to follow up with Dr. Jana Hakim in medical oncology. She was counseled on skin care as well as measures to avoid sun exposure to this area.  2. Survivorship. We discussed the importance of survivorship evaluation and she is not currently scheduled for this, though will be in the near future. We also reviewed the monthly calendar for access to resources offered within the cancer center.     Carola Rhine, PAC

## 2017-12-03 NOTE — Progress Notes (Signed)
[place]  [phone] [phone]     ID: Kelsey Henry DOB: [birth year]  MR#: [id number]  KZS#:[id number]  Patient Care Team: Patient, No Pcp Per as PCP - General (General Practice) Magrinat, Virgie Dad, MD as Consulting Physician (Oncology) Kyung Rudd, MD as Consulting Physician (Radiation Oncology) Rolm Bookbinder, MD as Consulting Physician (General Surgery) Meisinger, Sherren Mocha, MD as Consulting Physician (Obstetrics and Gynecology) Loletta Specter, MD (Unknown Physician Specialty) OTHER MD:  CHIEF COMPLAINT: triple negative breast cancer  CURRENT TREATMENT: observation 2  HISTORY OF CURRENT ILLNESS: From the original intake note:  Kelsey Henry had routine screening mammography October 2018 showing a possible right breast mass.  She was scheduled for unilateral right mammography and tomography with right breast ultrasonography at the breast center February 06, 2017.  This showed the breast density to be category D.  In the lower inner quadrant of the right breast there was a 0.6 cm oval mass, which by ultrasound measured 0.9 cm and was irregular and hypoechoic.  Ultrasound of the right axilla was sonographically benign.  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  The patient's subsequent history is as detailed below.  INTERVAL HISTORY: Kelsey Henry returns today for follow-up and treatment of her triple negative breast cancer. She completed adjuvant radiation on 10/21/2017. She tolerated this well. She did not have skin issues or fatigue.   REVIEW OF SYSTEMS: Kelsey Henry reports that she went back to work in June. She also returned to the gym for workouts. She denies unusual headaches, visual changes, nausea, vomiting, or  dizziness. There has been no unusual cough, phlegm production, or pleurisy. There has been no change in bowel or bladder habits. She denies unexplained fatigue or unexplained weight loss, bleeding, rash, or fever. A detailed review of systems was otherwise stable.    PAST MEDICAL HISTORY: Past Medical History:  Diagnosis Date  . Cancer [facility])    breast  . Genetic testing 02/19/2017   STAT Breast panel with reflext to Multi-Cancer panel (83 genes) @ [facility] - No pathogenic mutations detected  . GERD (gastroesophageal reflux disease)    no meds  . Seasonal allergies   . SVD (spontaneous vaginal delivery)    x 1    PAST SURGICAL HISTORY: Past Surgical History:  Procedure Laterality Date  . ABDOMINAL HYSTERECTOMY    . BILATERAL SALPINGECTOMY Bilateral 06/23/2012   Procedure: BILATERAL SALPINGECTOMY;  Surgeon: Cheri Fowler, MD;  Location: [facility];  Service: Gynecology;  Laterality: Bilateral;  . BREAST LUMPECTOMY WITH RADIOACTIVE SEED AND SENTINEL LYMPH NODE BIOPSY Right 03/23/2017   Procedure: BREAST LUMPECTOMY WITH RADIOACTIVE SEED AND SENTINEL LYMPH NODE BIOPSY;  Surgeon: Rolm Bookbinder, MD;  Location: [place];  Service: General;  Laterality: Right;  . LAPAROSCOPIC SUPRACERVICAL HYSTERECTOMY N/A 06/23/2012   Procedure: LAPAROSCOPIC SUPRACERVICAL HYSTERECTOMY;  Surgeon: Cheri Fowler, MD;  Location: [facility];  Service: Gynecology;  Laterality: N/A;  . PORT-A-CATH REMOVAL N/A 08/13/2017   Procedure: REMOVAL PORT-A-CATH;  Surgeon: Rolm Bookbinder, MD;  Location: [facility];  Service: General;  Laterality: N/A;  . PORTACATH PLACEMENT Right 03/23/2017   Procedure: INSERTION PORT-A-CATH;  Surgeon: Rolm Bookbinder, MD;  Location: [place];  Service: General;  Laterality: Right;  . TUBAL LIGATION    . VULVAR LESION REMOVAL N/A 11/01/2014  Procedure: MONS PUBIS LESION;  Surgeon: Cheri Fowler, MD;  Location: [facility];  Service: Gynecology;  Laterality: N/A;  local anesthesia with  IV sedation if needed  . WISDOM TOOTH EXTRACTION      FAMILY HISTORY No family history on file.The patient has no information regarding her father or his side of the family.  The patient's mother is 31 years old as of November 2018.  The patient has 3 brothers, 2 sisters.  One sister had 2 "benign tumors" removed from the right breast at the age of 75.  A maternal grandfather had prostate cancer in his 62s.  GYNECOLOGIC HISTORY:  Patient's last menstrual period was 05/26/2012. Menarche age 27, first live birth age 24, the patient is GX P1.  She stopped having periods 2012 when she underwent hysterectomy, without salpingo-oophorectomy..  She used hormone replacement for approximately 3 months.  She is used oral contraceptives remotely with no complications.  SOCIAL HISTORY:  Kelsey Henry works as an Psychologist, educational for [employer], in the paternal DNA subsection.  Her husband Lennette Bihari is an Clinical biochemist. Their son [name] is an Producer, television/film/video and lives in [place].  The patient has no grandchildren.  She is a Psychologist, forensic.    ADVANCED DIRECTIVES: Not in place   HEALTH MAINTENANCE: Social History   Tobacco Use  . Smoking status: Never Smoker  . Smokeless tobacco: Never Used  Substance Use Topics  . Alcohol use: No  . Drug use: No     Colonoscopy: n/a  PAP: Status post hysterectomy  Bone density: n/a   Allergies  Allergen Reactions  . Aspirin Hives    Current Outpatient Medications  Medication Sig Dispense Refill  . acidophilus (RISAQUAD) CAPS capsule Take by mouth daily.    Marland Kitchen loratadine (CLARITIN) 10 MG tablet Take 10 mg by mouth daily as needed for allergies.    . vitamin C (ASCORBIC ACID) 250 MG tablet Take 250 mg by mouth daily.     No current facility-administered medications for this visit.     OBJECTIVE: Middle-aged African-American woman   Vitals:   12/04/17 1040  BP: 130/90  Pulse: 77  Resp: 17  Temp: 98.4 F (36.9 C)  SpO2: 100%     Body mass index is 23.52 kg/m.   Wt  Readings from Last 3 Encounters:  12/04/17 124 lb 8 oz (56.5 kg)  11/30/17 126 lb 6.4 oz (57.3 kg)  09/15/17 128 lb 9.6 oz (58.3 kg)      ECOG FS:0 - Asymptomatic  Sclerae unicteric, EOMs intact Oropharynx clear and moist No cervical or supraclavicular adenopathy Lungs no rales or rhonchi Heart regular rate and rhythm Abd soft, nontender, positive bowel sounds MSK no focal spinal tenderness, no upper extremity lymphedema Neuro: nonfocal, well oriented, appropriate affect Breasts: the right breast is status post lumpectomy and radiation.  There is minimal hyperpigmentation.  The cosmetic result is excellent.  There is no evidence of disease recurrence or residual disease.  The left breast is benign.  Both axillae are benign.  LAB RESULTS:  CMP     Component Value Date/Time   NA 140 09/15/2017 1008   NA 142 02/18/2017 1221   K 4.7 09/15/2017 1008   K 4.6 02/18/2017 1221   CL 103 09/15/2017 1008   CO2 27 09/15/2017 1008   CO2 27 02/18/2017 1221   GLUCOSE 85 09/15/2017 1008   GLUCOSE 95 02/18/2017 1221   BUN 14 09/15/2017 1008   BUN 18.4 02/18/2017 1221   CREATININE 1.02 09/15/2017 1008  CREATININE 1.0 02/18/2017 1221   CALCIUM 10.4 09/15/2017 1008   CALCIUM 10.3 02/18/2017 1221   PROT 7.9 09/15/2017 1008   PROT 8.3 02/18/2017 1221   ALBUMIN 4.6 09/15/2017 1008   ALBUMIN 4.4 02/18/2017 1221   AST 27 09/15/2017 1008   AST 22 02/18/2017 1221   ALT 19 09/15/2017 1008   ALT 18 02/18/2017 1221   ALKPHOS 95 09/15/2017 1008   ALKPHOS 89 02/18/2017 1221   BILITOT 0.5 09/15/2017 1008   BILITOT 0.48 02/18/2017 1221   GFRNONAA >60 09/15/2017 1008   GFRAA >60 09/15/2017 1008    No results found for: TOTALPROTELP, ALBUMINELP, A1GS, A2GS, BETS, BETA2SER, GAMS, MSPIKE, SPEI  No results found for: Nils Pyle, [facility]  Lab Results  Component Value Date   WBC 6.0 12/04/2017   NEUTROABS 3.8 12/04/2017   HGB 13.0 12/04/2017   HCT 44.1 12/04/2017   MCV 98.7  12/04/2017   PLT 213 12/04/2017      Chemistry      Component Value Date/Time   NA 140 09/15/2017 1008   NA 142 02/18/2017 1221   K 4.7 09/15/2017 1008   K 4.6 02/18/2017 1221   CL 103 09/15/2017 1008   CO2 27 09/15/2017 1008   CO2 27 02/18/2017 1221   BUN 14 09/15/2017 1008   BUN 18.4 02/18/2017 1221   CREATININE 1.02 09/15/2017 1008   CREATININE 1.0 02/18/2017 1221      Component Value Date/Time   CALCIUM 10.4 09/15/2017 1008   CALCIUM 10.3 02/18/2017 1221   ALKPHOS 95 09/15/2017 1008   ALKPHOS 89 02/18/2017 1221   AST 27 09/15/2017 1008   AST 22 02/18/2017 1221   ALT 19 09/15/2017 1008   ALT 18 02/18/2017 1221   BILITOT 0.5 09/15/2017 1008   BILITOT 0.48 02/18/2017 1221       No results found for: LABCA2  No components found for: [id number]  No results for input(s): INR in the last 168 hours.  No results found for: LABCA2  No results found for: DUK025  No results found for: KYH062  No results found for: BJS283  No results found for: CA2729  No components found for: HGQUANT  No results found for: CEA1 / No results found for: CEA1   No results found for: AFPTUMOR  No results found for: CHROMOGRNA  No results found for: PSA1  Appointment on 12/04/2017  Component Date Value Ref Range Status  . WBC Count 12/04/2017 6.0  3.9 - 10.3 K/uL Final  . RBC 12/04/2017 4.47  3.70 - 5.45 MIL/uL Final  . Hemoglobin 12/04/2017 13.0  11.6 - 15.9 g/dL Final  . HCT 12/04/2017 44.1  34.8 - 46.6 % Final  . MCV 12/04/2017 98.7  79.5 - 101.0 fL Final  . MCH 12/04/2017 29.1  25.1 - 34.0 pg Final  . MCHC 12/04/2017 29.5* 31.5 - 36.0 g/dL Final  . RDW 12/04/2017 15.0* 11.2 - 14.5 % Final  . Platelet Count 12/04/2017 213  145 - 400 K/uL Final  . Neutrophils Relative % 12/04/2017 63  % Final  . Neutro Abs 12/04/2017 3.8  1.5 - 6.5 K/uL Final  . Lymphocytes Relative 12/04/2017 23  % Final  . Lymphs Abs 12/04/2017 1.4  0.9 - 3.3 K/uL Final  . Monocytes Relative 12/04/2017  7  % Final  . Monocytes Absolute 12/04/2017 0.4  0.1 - 0.9 K/uL Final  . Eosinophils Relative 12/04/2017 7  % Final  . Eosinophils Absolute 12/04/2017 0.4  0.0 - 0.5 K/uL  Final  . Basophils Relative 12/04/2017 0  % Final  . Basophils Absolute 12/04/2017 0.0  0.0 - 0.1 K/uL Final   Performed at [facility], [place] [address], [place], [place] [address]    (this displays the last labs from the last 3 days)  No results found for: TOTALPROTELP, ALBUMINELP, A1GS, A2GS, BETS, BETA2SER, GAMS, MSPIKE, SPEI (this displays SPEP labs)  No results found for: KPAFRELGTCHN, LAMBDASER, KAPLAMBRATIO (kappa/lambda light chains)  No results found for: HGBA, HGBA2QUANT, HGBFQUANT, HGBSQUAN (Hemoglobinopathy evaluation)   No results found for: LDH  No results found for: IRON, TIBC, IRONPCTSAT (Iron and TIBC)  No results found for: FERRITIN  Urinalysis No results found for: COLORURINE, APPEARANCEUR, LABSPEC, PHURINE, GLUCOSEU, HGBUR, BILIRUBINUR, KETONESUR, PROTEINUR, UROBILINOGEN, NITRITE, LEUKOCYTESUR   STUDIES: No results found.  ELIGIBLE FOR AVAILABLE RESEARCH PROTOCOL: UPBEAT; did not qualify for BR 003  ASSESSMENT: 52 y.o. [place] woman status post right breast lower inner quadrant biopsy February 09, 2017 for a clinical T1b N0, stage IB invasive ductal carcinoma, grade 3, triple negative  (1) status post right lumpectomy 03/23/2017 for a pT1b pN0, stage IB invasive ductal carcinoma, grade 3, with negative margins.  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 The patient then received a boost to the seroma.  This delivered an additional 10 Gy in 5 fractions. The total dose was 60.4 Gy.   (4) genetics testing 02/19/2017 through the STAT Breast panel with reflext to Multi-Cancer panel (83 genes) @ [facility] - No pathogenic mutations detected in ALK, APC, ATM, AXIN2, BAP1, BARD1, BLM, BMPR1A, BRCA1, BRCA2, BRIP1, CASR, CDC73, CDH1, CDK4, CDKN1B, CDKN1C, CDKN2A, CEBPA, CHEK2, CTNNA1, DICER1, DIS3L2, EGFR, EPCAM, FH, FLCN, GATA2, GPC3, GREM1, HOXB13, HRAS, KIT, MAX, MEN1, MET, MITF, MLH1, MSH2, MSH3, MSH6, MUTYH, NBN, NF1, NF2, NTHL1, PALB2, PDGFRA, PHOX2B, PMS2, POLD1, POLE, POT1, PRKAR1A, PTCH1, PTEN, RAD50, RAD51C, RAD51D, RB1, RECQL4, RET, RUNX1, SDHA, SDHAF2, SDHB, SDHC, SDHD, SMAD4, SMARCA4, SMARCB1, SMARCE1, STK11, SUFU, TERC, TERT, TMEM127, TP53, TSC1, TSC2, VHL, WRN, WT1.  (a) Variants of Uncertain Significance in PDGFRA c.3040G>A (p.Ala1014Thr) and SMARCA4 c.2044C>G (p.Leu682Val) noted  PLAN: Kelsey Henry is now done with all her treatments and we are starting routine follow-up.  She is going to have mammography in October and she will see me again in November.  She will see Dr. Donne Hazel in February.  She will then see me again in May and at that point we will probably's broaden the follow-up interval to every 6 months.  I have recommended that she participate in finding your new normal.  I also gave her information on our pelvic floor rehabilitation program.  She does have very dense breasts and if they are still category D we will consider MRI sometime next year  She has an excellent diet and exercise program  She knows to call for any other issues that may develop before the next visit.   Magrinat, Virgie Dad, MD  12/04/17 10:53 AM Medical Oncology and Hematology [facility] [address] [place],  [address] Tel. [phone]    Fax. [phone]  Alice Rieger, am acting as scribe for Chauncey Cruel MD.  I, Lurline Del MD, have  reviewed the above documentation  for accuracy and completeness, and I agree with the above.

## 2017-12-04 ENCOUNTER — Inpatient Hospital Stay: Payer: BLUE CROSS/BLUE SHIELD | Attending: Oncology

## 2017-12-04 ENCOUNTER — Inpatient Hospital Stay (HOSPITAL_BASED_OUTPATIENT_CLINIC_OR_DEPARTMENT_OTHER): Payer: BLUE CROSS/BLUE SHIELD | Admitting: Oncology

## 2017-12-04 ENCOUNTER — Telehealth: Payer: Self-pay | Admitting: Oncology

## 2017-12-04 VITALS — BP 130/90 | HR 77 | Temp 98.4°F | Resp 17 | Ht 61.0 in | Wt 124.5 lb

## 2017-12-04 DIAGNOSIS — C50311 Malignant neoplasm of lower-inner quadrant of right female breast: Secondary | ICD-10-CM

## 2017-12-04 DIAGNOSIS — Z171 Estrogen receptor negative status [ER-]: Secondary | ICD-10-CM

## 2017-12-04 DIAGNOSIS — Z923 Personal history of irradiation: Secondary | ICD-10-CM

## 2017-12-04 LAB — CMP (CANCER CENTER ONLY)
ALBUMIN: 4.2 g/dL (ref 3.5–5.0)
ALT: 50 U/L — AB (ref 0–44)
ANION GAP: 6 (ref 5–15)
AST: 41 U/L (ref 15–41)
Alkaline Phosphatase: 107 U/L (ref 38–126)
BUN: 16 mg/dL (ref 6–20)
CHLORIDE: 107 mmol/L (ref 98–111)
CO2: 30 mmol/L (ref 22–32)
Calcium: 10.2 mg/dL (ref 8.9–10.3)
Creatinine: 0.91 mg/dL (ref 0.44–1.00)
GFR, Estimated: >60 mL/min (ref >60)
Glucose, Bld: 65 mg/dL — ABNORMAL LOW (ref 70–99)
Potassium: 4 mmol/L (ref 3.5–5.1)
SODIUM: 143 mmol/L (ref 135–145)
Total Bilirubin: 0.4 mg/dL (ref 0.3–1.2)
Total Protein: 7.6 g/dL (ref 6.5–8.1)

## 2017-12-04 LAB — CBC WITH DIFFERENTIAL (CANCER CENTER ONLY)
Basophils Absolute: 0 10*3/uL (ref 0.0–0.1)
Basophils Relative: 0 %
Eosinophils Absolute: 0.4 10*3/uL (ref 0.0–0.5)
Eosinophils Relative: 7 %
HCT: 44.1 % (ref 34.8–46.6)
HEMOGLOBIN: 13 g/dL (ref 11.6–15.9)
LYMPHS ABS: 1.4 10*3/uL (ref 0.9–3.3)
Lymphocytes Relative: 23 %
MCH: 29.1 pg (ref 25.1–34.0)
MCHC: 29.5 g/dL — AB (ref 31.5–36.0)
MCV: 98.7 fL (ref 79.5–101.0)
MONOS PCT: 7 %
Monocytes Absolute: 0.4 10*3/uL (ref 0.1–0.9)
NEUTROS ABS: 3.8 10*3/uL (ref 1.5–6.5)
Neutrophils Relative %: 63 %
Platelet Count: 213 10*3/uL (ref 145–400)
RBC: 4.47 MIL/uL (ref 3.70–5.45)
RDW: 15 % — ABNORMAL HIGH (ref 11.2–14.5)
WBC Count: 6 10*3/uL (ref 3.9–10.3)

## 2017-12-04 NOTE — Telephone Encounter (Signed)
Appts scheduled AVS/Calendar mailed per 8/23 los

## 2017-12-07 ENCOUNTER — Other Ambulatory Visit: Payer: BLUE CROSS/BLUE SHIELD

## 2017-12-07 ENCOUNTER — Ambulatory Visit: Payer: BLUE CROSS/BLUE SHIELD | Admitting: Oncology

## 2017-12-09 ENCOUNTER — Ambulatory Visit (HOSPITAL_BASED_OUTPATIENT_CLINIC_OR_DEPARTMENT_OTHER)
Admission: RE | Admit: 2017-12-09 | Discharge: 2017-12-09 | Disposition: A | Discharge status: Home / Self Care | Payer: BLUE CROSS/BLUE SHIELD | Source: Ambulatory Visit | Attending: Internal Medicine | Admitting: Internal Medicine

## 2017-12-09 ENCOUNTER — Ambulatory Visit (HOSPITAL_COMMUNITY)
Admission: RE | Admit: 2017-12-09 | Discharge: 2017-12-09 | Disposition: A | Discharge status: Home / Self Care | Payer: BLUE CROSS/BLUE SHIELD | Source: Ambulatory Visit | Attending: Internal Medicine | Admitting: Internal Medicine

## 2017-12-09 VITALS — BP 128/81 | HR 71 | Wt 125.0 lb

## 2017-12-09 DIAGNOSIS — Z886 Allergy status to analgesic agent status: Secondary | ICD-10-CM | POA: Diagnosis not present

## 2017-12-09 DIAGNOSIS — Z79899 Other long term (current) drug therapy: Secondary | ICD-10-CM | POA: Insufficient documentation

## 2017-12-09 DIAGNOSIS — C50311 Malignant neoplasm of lower-inner quadrant of right female breast: Secondary | ICD-10-CM

## 2017-12-09 DIAGNOSIS — Z171 Estrogen receptor negative status [ER-]: Secondary | ICD-10-CM

## 2017-12-09 DIAGNOSIS — I340 Nonrheumatic mitral (valve) insufficiency: Secondary | ICD-10-CM | POA: Insufficient documentation

## 2017-12-09 DIAGNOSIS — C50911 Malignant neoplasm of unspecified site of right female breast: Secondary | ICD-10-CM | POA: Diagnosis not present

## 2017-12-09 NOTE — Patient Instructions (Signed)
Follow up as needed

## 2017-12-09 NOTE — Progress Notes (Signed)
  Echocardiogram 2D Echocardiogram has been performed.  Kelsey Henry L Androw 12/09/2017, 1:53 PM

## 2017-12-09 NOTE — Progress Notes (Signed)
CARDIO-ONCOLOGY CLINIC CONSULT NOTE  Referring Physician: Dr. Jana Hakim  HPI: [name] is 52 y.o. female with no past medical history except for right breast cancer referred by Dr.Magrinat for enrollment into the Cardio-Oncology program.  Cancer course:  (1) status post right lumpectomy 03/23/2017 for a pT1b pN0, stage IB invasive ductal carcinoma, grade 3, with negative margins.  (2) adjuvant chemotherapy consisting of cyclophosphamide and doxorubicin in dose dense fashion x4 to start 04/28/2017, followed by weekly paclitaxel x12  (does not qualify for BR003)             (a) echocardiogram 02/25/2017 showed a baseline ejection fraction in the 60-65%  (3) adjuvant radiation to follow  Doing well. Has finished AC chemo in 4/19. No SOB or edema. Feels good. Working at [employer] today EF 60-65% GLS -28.8% Personally reviewed    Past Medical History:  Diagnosis Date  . Cancer [facility])    breast  . Genetic testing 02/19/2017   STAT Breast panel with reflext to Multi-Cancer panel (83 genes) @ [facility] - No pathogenic mutations detected  . GERD (gastroesophageal reflux disease)    no meds  . Seasonal allergies   . SVD (spontaneous vaginal delivery)    x 1    Current Outpatient Medications  Medication Sig Dispense Refill  . loratadine (CLARITIN) 10 MG tablet Take 10 mg by mouth daily as needed for allergies.    . Multiple Vitamins-Minerals (MULTIVITAMIN ADULT) TABS Take by mouth.    . Probiotic Product (PROBIOTIC-10 PO) Take 1 capsule by mouth daily.    . vitamin C (ASCORBIC ACID) 250 MG tablet Take 250 mg by mouth daily.     No current facility-administered medications for this encounter.     Allergies  Allergen Reactions  . Aspirin Hives      Social History   Socioeconomic History  . Marital status: Married    Spouse name: Not on file  . Number of children: Not on file  . Years of education: Not on file  . Highest education level: Not on file    Occupational History  . Not on file  Social Needs  . Financial resource strain: Not on file  . Food insecurity:    Worry: Not on file    Inability: Not on file  . Transportation needs:    Medical: Not on file    Non-medical: Not on file  Tobacco Use  . Smoking status: Never Smoker  . Smokeless tobacco: Never Used  Substance and Sexual Activity  . Alcohol use: No  . Drug use: No  . Sexual activity: Yes    Birth control/protection: Surgical  Lifestyle  . Physical activity:    Days per week: Not on file    Minutes per session: Not on file  . Stress: Not on file  Relationships  . Social connections:    Talks on phone: Not on file    Gets together: Not on file    Attends religious service: Not on file    Active member of club or organization: Not on file    Attends meetings of clubs or organizations: Not on file    Relationship status: Not on file  . Intimate partner violence:    Fear of current or ex partner: No    Emotionally abused: No    Physically abused: No    Forced sexual activity: No  Other Topics Concern  . Not on file  Social History Narrative  . Not on file  FHx: No family h/o premature CAD or HF  Vitals:   12/09/17 1401  BP: 128/81  Pulse: 71  SpO2: 100%  Weight: 56.7 kg (125 lb)    PHYSICAL EXAM: General:  Well appearing. No resp difficulty HEENT: normal Neck: supple. no JVD. Carotids 2+ bilat; no bruits. No lymphadenopathy or thryomegaly appreciated. Cor: PMI nondisplaced. Regular rate & rhythm. No rubs, gallops or murmurs. Lungs: clear Abdomen: soft, nontender, nondistended. No hepatosplenomegaly. No bruits or masses. Good bowel sounds. Extremities: no cyanosis, clubbing, rash, edema Neuro: alert & orientedx3, cranial nerves grossly intact. moves all 4 extremities w/o difficulty. Affect pleasant    ASSESSMENT & PLAN: 1. Right breast cancer - s/p lumpectomy - she has completed AC chemo 4/19 and XRT - reviewed echos personally. EF and  Doppler parameters stable. No HF on exam. EF stable 6 months post [facility] therapy. I explained incidence of Adriamycin cardiotoxicity in detail include small possibility of very delayed toxicity.   - f/u as needed    Glori Bickers, MD  2:19 PM

## 2018-01-01 ENCOUNTER — Other Ambulatory Visit: Payer: Self-pay | Admitting: Oncology

## 2018-01-10 ENCOUNTER — Other Ambulatory Visit: Payer: Self-pay | Admitting: Oncology

## 2018-01-29 DIAGNOSIS — Z130 Encounter for screening for diseases of the blood and blood-forming organs and certain disorders involving the immune mechanism: Secondary | ICD-10-CM | POA: Diagnosis not present

## 2018-01-29 DIAGNOSIS — Z01419 Encounter for gynecological examination (general) (routine) without abnormal findings: Secondary | ICD-10-CM | POA: Diagnosis not present

## 2018-01-29 DIAGNOSIS — Z1389 Encounter for screening for other disorder: Secondary | ICD-10-CM | POA: Diagnosis not present

## 2018-01-29 DIAGNOSIS — Z6824 Body mass index (BMI) 24.0-24.9, adult: Secondary | ICD-10-CM | POA: Diagnosis not present

## 2018-02-08 ENCOUNTER — Ambulatory Visit
Admission: RE | Admit: 2018-02-08 | Discharge: 2018-02-08 | Disposition: A | Discharge status: Home / Self Care | Payer: BLUE CROSS/BLUE SHIELD | Source: Ambulatory Visit | Attending: Oncology | Admitting: Oncology

## 2018-02-08 DIAGNOSIS — R922 Inconclusive mammogram: Secondary | ICD-10-CM | POA: Diagnosis not present

## 2018-02-08 DIAGNOSIS — C50311 Malignant neoplasm of lower-inner quadrant of right female breast: Secondary | ICD-10-CM

## 2018-02-08 DIAGNOSIS — Z171 Estrogen receptor negative status [ER-]: Principal | ICD-10-CM

## 2018-02-08 DIAGNOSIS — Z853 Personal history of malignant neoplasm of breast: Secondary | ICD-10-CM | POA: Diagnosis not present

## 2018-02-08 HISTORY — DX: Personal history of antineoplastic chemotherapy: Z92.21

## 2018-02-08 HISTORY — DX: Personal history of irradiation: Z92.3

## 2018-02-24 ENCOUNTER — Telehealth: Payer: Self-pay

## 2018-02-24 NOTE — Telephone Encounter (Signed)
Spoke with patient reminding of SCP visit with NP on 03/01/18 at 8:30. Patient said she will come to appt.

## 2018-03-01 ENCOUNTER — Inpatient Hospital Stay: Payer: BLUE CROSS/BLUE SHIELD

## 2018-03-01 ENCOUNTER — Encounter: Payer: Self-pay | Admitting: Adult Health

## 2018-03-01 ENCOUNTER — Telehealth: Payer: Self-pay | Admitting: Oncology

## 2018-03-01 ENCOUNTER — Inpatient Hospital Stay: Payer: BLUE CROSS/BLUE SHIELD | Attending: Oncology | Admitting: Adult Health

## 2018-03-01 VITALS — BP 139/85 | HR 73 | Temp 98.2°F | Resp 17 | Ht 61.0 in | Wt 123.9 lb

## 2018-03-01 DIAGNOSIS — Z171 Estrogen receptor negative status [ER-]: Principal | ICD-10-CM

## 2018-03-01 DIAGNOSIS — C50311 Malignant neoplasm of lower-inner quadrant of right female breast: Secondary | ICD-10-CM

## 2018-03-01 DIAGNOSIS — Z923 Personal history of irradiation: Secondary | ICD-10-CM | POA: Diagnosis not present

## 2018-03-01 DIAGNOSIS — Z79899 Other long term (current) drug therapy: Secondary | ICD-10-CM | POA: Insufficient documentation

## 2018-03-01 LAB — CBC WITH DIFFERENTIAL (CANCER CENTER ONLY)
ABS IMMATURE GRANULOCYTES: 0.03 10*3/uL (ref 0.00–0.07)
BASOS ABS: 0 10*3/uL (ref 0.0–0.1)
Basophils Relative: 0 %
EOS ABS: 0.2 10*3/uL (ref 0.0–0.5)
EOS PCT: 4 %
HEMATOCRIT: 41.3 % (ref 36.0–46.0)
Hemoglobin: 13.4 g/dL (ref 12.0–15.0)
IMMATURE GRANULOCYTES: 1 %
LYMPHS ABS: 1.5 10*3/uL (ref 0.7–4.0)
LYMPHS PCT: 25 %
MCH: 28.9 pg (ref 26.0–34.0)
MCHC: 32.4 g/dL (ref 30.0–36.0)
MCV: 89 fL (ref 80.0–100.0)
MONO ABS: 0.4 10*3/uL (ref 0.1–1.0)
MONOS PCT: 7 %
Neutro Abs: 4 10*3/uL (ref 1.7–7.7)
Neutrophils Relative %: 63 %
PLATELETS: 240 10*3/uL (ref 150–400)
RBC: 4.64 MIL/uL (ref 3.87–5.11)
RDW: 12.3 % (ref 11.5–15.5)
WBC Count: 6.3 10*3/uL (ref 4.0–10.5)
nRBC: 0 % (ref 0.0–0.2)

## 2018-03-01 LAB — CMP (CANCER CENTER ONLY)
ALBUMIN: 4.1 g/dL (ref 3.5–5.0)
ALK PHOS: 112 U/L (ref 38–126)
ALT: 34 U/L (ref 0–44)
AST: 38 U/L (ref 15–41)
Anion gap: 10 (ref 5–15)
BUN: 17 mg/dL (ref 6–20)
CALCIUM: 10 mg/dL (ref 8.9–10.3)
CO2: 28 mmol/L (ref 22–32)
CREATININE: 1.05 mg/dL — AB (ref 0.44–1.00)
Chloride: 107 mmol/L (ref 98–111)
GFR, Estimated: >60 mL/min (ref >60)
GLUCOSE: 91 mg/dL (ref 70–99)
Potassium: 4.9 mmol/L (ref 3.5–5.1)
SODIUM: 145 mmol/L (ref 135–145)
Total Bilirubin: 0.3 mg/dL (ref 0.3–1.2)
Total Protein: 7.7 g/dL (ref 6.5–8.1)

## 2018-03-01 MED ORDER — OMEPRAZOLE 40 MG PO CPDR: 40.0000 mg | DELAYED_RELEASE_CAPSULE | Freq: Every day | ORAL | 3 refills | Status: DC

## 2018-03-01 NOTE — Telephone Encounter (Signed)
Gave avs and calendar ° °

## 2018-03-01 NOTE — Progress Notes (Signed)
CLINIC:  Survivorship   REASON FOR VISIT:  Routine follow-up post-treatment for a recent history of breast cancer.  BRIEF ONCOLOGIC HISTORY:    Malignant neoplasm of lower-inner quadrant of right breast of female, estrogen receptor negative ([place])   02/09/2017 Initial Diagnosis    status post right breast lower inner quadrant biopsy for a clinical T1b N0, stage IB invasive ductal carcinoma, grade 3, triple negative    02/19/2017 Genetic Testing    No pathogenic mutations detected in ALK, APC, ATM, AXIN2, BAP1, BARD1, BLM, BMPR1A, BRCA1, BRCA2, BRIP1, CASR, CDC73, CDH1, CDK4, CDKN1B, CDKN1C, CDKN2A, CEBPA, CHEK2, CTNNA1, DICER1, DIS3L2, EGFR, EPCAM, FH, FLCN, GATA2, GPC3, GREM1, HOXB13, HRAS, KIT, MAX, MEN1, MET, MITF, MLH1, MSH2, MSH3, MSH6, MUTYH, NBN, NF1, NF2, NTHL1, PALB2, PDGFRA, PHOX2B, PMS2, POLD1, POLE, POT1, PRKAR1A, PTCH1, PTEN, RAD50, RAD51C, RAD51D, RB1, RECQL4, RET, RUNX1, SDHA, SDHAF2, SDHB, SDHC, SDHD, SMAD4, SMARCA4, SMARCB1, SMARCE1, STK11, SUFU, TERC, TERT, TMEM127, TP53, TSC1, TSC2, VHL, WRN, WT1.             (a) Variants of Uncertain Significance in PDGFRA c.3040G>A (p.Ala1014Thr) and SMARCA4 c.2044C>G (p.Leu682Val) noted    03/23/2017 Surgery    status post right lumpectomy for a pT1b pN0, stage IB invasive ductal carcinoma, grade 3, with negative margins.    04/28/2017 - 07/14/2017 Adjuvant Chemotherapy    adjuvant chemotherapy consisting of cyclophosphamide and doxorubicin in dose dense fashion x4 started 04/28/2017, completed 06/09/2017,  followed by weekly paclitaxel with 12 doses planned, starting 06/23/2017,              (a) paclitaxel discontinued after 4 doses because of neuropathy; last dose 07/14/2017         09/03/2017 - 10/21/2017 Radiation Therapy    adjuvant radiation completed 10/21/2017: Site/dose:50.4 Gy in 28 fractions to the rightbreast. The patient then received a boost to the seroma. This delivered an additional 10 Gy in 5 fractions. The total dose  was 60.4 Gy.     INTERVAL HISTORY:  [name] presents to the Survivorship Clinic today for our initial meeting to review her survivorship care plan detailing her treatment course for breast cancer, as well as monitoring long-term side effects of that treatment, education regarding health maintenance, screening, and overall wellness and health promotion.     Overall, [name] reports feeling quite well.  She underwent bilateral diagnostic mammogram on 02/08/2018 that demonstrated breast density category c and showed no evidence of malignancy.   Kelsey Henry is doing well today and she has no issues or concerns today.  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  This has since resolved.  She has completed [facility] and is signing up for the [facility].    REVIEW OF SYSTEMS:  Review of Systems  Constitutional: Negative for appetite change, chills, fatigue, fever and unexpected weight change.  HENT:   Negative for hearing loss, lump/mass, sore throat and trouble swallowing.   Eyes: Negative for eye problems.  Respiratory: Negative for cough and shortness of breath.   Cardiovascular: Negative for chest pain, leg swelling and palpitations.  Gastrointestinal: Negative for abdominal distention, abdominal pain, constipation, diarrhea and vomiting.  Endocrine: Negative for hot flashes.  Musculoskeletal: Negative for arthralgias.  Skin: Negative for itching and rash.  Neurological: Negative for dizziness, extremity weakness and numbness.  Hematological: Negative for adenopathy. Does not bruise/bleed easily.  Psychiatric/Behavioral: Negative for depression. The patient is not nervous/anxious.   Breast: Denies any new nodularity, masses, tenderness, nipple changes, or  nipple discharge.      ONCOLOGY TREATMENT TEAM:  1. Surgeon:  Dr. Donne Hazel at [facility] 2. Medical Oncologist: Dr. Jana Hakim  3. Radiation Oncologist: Dr. Lisbeth Renshaw     PAST MEDICAL/SURGICAL HISTORY:  Past Medical History:  Diagnosis Date  . Cancer [facility])    breast  . Genetic testing 02/19/2017   STAT Breast panel with reflext to Multi-Cancer panel (83 genes) @ [facility] - No pathogenic mutations detected  . GERD (gastroesophageal reflux disease)    no meds  . Personal history of chemotherapy    2019  . Personal history of radiation therapy    2019  . Seasonal allergies   . SVD (spontaneous vaginal delivery)    x 1   Past Surgical History:  Procedure Laterality Date  . ABDOMINAL HYSTERECTOMY    . BILATERAL SALPINGECTOMY Bilateral 06/23/2012   Procedure: BILATERAL SALPINGECTOMY;  Surgeon: Cheri Fowler, MD;  Location: [facility];  Service: Gynecology;  Laterality: Bilateral;  . BREAST LUMPECTOMY Right 03/23/2017  . BREAST LUMPECTOMY WITH RADIOACTIVE SEED AND SENTINEL LYMPH NODE BIOPSY Right 03/23/2017   Procedure: BREAST LUMPECTOMY WITH RADIOACTIVE SEED AND SENTINEL LYMPH NODE BIOPSY;  Surgeon: Rolm Bookbinder, MD;  Location: [place];  Service: General;  Laterality: Right;  . LAPAROSCOPIC SUPRACERVICAL HYSTERECTOMY N/A 06/23/2012   Procedure: LAPAROSCOPIC SUPRACERVICAL HYSTERECTOMY;  Surgeon: Cheri Fowler, MD;  Location: [place] [facility];  Service: Gynecology;  Laterality: N/A;  . PORT-A-CATH REMOVAL N/A 08/13/2017   Procedure: REMOVAL PORT-A-CATH;  Surgeon: Rolm Bookbinder, MD;  Location: [place];  Service: General;  Laterality: N/A;  . PORTACATH PLACEMENT Right 03/23/2017   Procedure: INSERTION PORT-A-CATH;  Surgeon: Rolm Bookbinder, MD;  Location: [place];  Service: General;  Laterality: Right;  . TUBAL LIGATION    . VULVAR LESION REMOVAL N/A 11/01/2014   Procedure: MONS PUBIS LESION;  Surgeon: Cheri Fowler, MD;  Location: [facility];  Service: Gynecology;  Laterality: N/A;  local anesthesia with IV sedation if needed  . WISDOM TOOTH EXTRACTION       ALLERGIES:  Allergies  Allergen Reactions  . Aspirin Hives     CURRENT  MEDICATIONS:  Outpatient Encounter Medications as of 03/01/2018  Medication Sig  . loratadine (CLARITIN) 10 MG tablet Take 10 mg by mouth daily as needed for allergies.  . Multiple Vitamins-Minerals (MULTIVITAMIN ADULT) TABS Take by mouth.  Marland Kitchen omeprazole (PRILOSEC) 40 MG capsule TAKE 1 CAPSULE BY MOUTH EVERY DAY  . Probiotic Product (PROBIOTIC-10 PO) Take 1 capsule by mouth daily.  . vitamin C (ASCORBIC ACID) 250 MG tablet Take 250 mg by mouth daily.   No facility-administered encounter medications on file as of 03/01/2018.      ONCOLOGIC FAMILY HISTORY:  Non contributory   GENETIC COUNSELING/TESTING: See above  SOCIAL HISTORY:  Social History   Socioeconomic History  . Marital status: Married    Spouse name: Not on file  . Number of children: Not on file  . Years of education: Not on file  . Highest education level: Not on file  Occupational History  . Not on file  Social Needs  . Financial resource strain: Not on file  . Food insecurity:    Worry: Not on file    Inability: Not on file  . Transportation needs:    Medical: Not on file    Non-medical: Not on file  Tobacco Use  . Smoking status: Never Smoker  . Smokeless tobacco: Never Used  Substance and Sexual Activity  . Alcohol use: No  .  Drug use: No  . Sexual activity: Yes    Birth control/protection: Surgical  Lifestyle  . Physical activity:    Days per week: Not on file    Minutes per session: Not on file  . Stress: Not on file  Relationships  . Social connections:    Talks on phone: Not on file    Gets together: Not on file    Attends religious service: Not on file    Active member of club or organization: Not on file    Attends meetings of clubs or organizations: Not on file    Relationship status: Not on file  . Intimate partner violence:    Fear of current or ex partner: No    Emotionally abused: No    Physically abused: No    Forced sexual activity: No  Other Topics Concern  . Not on file   Social History Narrative  . Not on file       PHYSICAL EXAMINATION:  Vital Signs:   Vitals:   03/01/18 0839  BP: 139/85  Pulse: 73  Resp: 17  Temp: 98.2 F (36.8 C)  SpO2: 100%   Filed Weights   03/01/18 0839  Weight: 123 lb 14.4 oz (56.2 kg)   General: Well-nourished, well-appearing female in no acute distress.  She is unaccompanied today.   HEENT: Head is normocephalic.  Pupils equal and reactive to light. Conjunctivae clear without exudate.  Sclerae anicteric. Oral mucosa is pink, moist.  Oropharynx is pink without lesions or erythema.  Lymph: No cervical, supraclavicular, or infraclavicular lymphadenopathy noted on palpation.  Cardiovascular: Regular rate and rhythm.Marland Kitchen Respiratory: Clear to auscultation bilaterally. Chest expansion symmetric; breathing non-labored.  Breasts: right breast s/p lumpectomy and radiation, no sign of recurrence, left breast benign GI: Abdomen soft and round; non-tender, non-distended. Bowel sounds normoactive.  GU: Deferred.  Neuro: No focal deficits. Steady gait.  Psych: Mood and affect normal and appropriate for situation.  Extremities: No edema. MSK: No focal spinal tenderness to palpation.  Full range of motion in bilateral upper extremities Skin: Warm and dry.  LABORATORY DATA:  None for this visit.  DIAGNOSTIC IMAGING:       ASSESSMENT AND PLAN:  Ms.. Kelsey Henry is a pleasant 52 y.o. female with Stage IB right breast invasive ductal carcinoma, ER-/PR-/HER2-, diagnosed in 01/2017, treated with lumpectomy, adjuvant chemotherapy, and adjuvant radiation therapy.  She presents to the Survivorship Clinic for our initial meeting and routine follow-up post-completion of treatment for breast cancer.    1. Stage IB right breast cancer:  [name] is continuing to recover from definitive treatment for breast cancer. She will follow-up with her medical oncologist, Dr.Magrinat in 6 months with history and physical exam per surveillance  protocol. Her mammogram in 01/2018 was normal and showed no evidence of malignancy.  She had breast density of category C.  Her previous mammograms show a breast density of D.  She wonders if she should have a breast MRI and plans on reviewing with Dr. Donne Hazel at her appt with him in 05/2018. Today, a comprehensive survivorship care plan and treatment summary was reviewed with the patient today detailing her breast cancer diagnosis, treatment course, potential late/long-term effects of treatment, appropriate follow-up care with recommendations for the future, and patient education resources.  A copy of this summary, along with a letter will be sent to the patient's primary care provider via mail/fax/In Basket message after today's visit.    2. Bone health:  Given [name]'s age/history of  breast cancer, she is at slight risk for bone demineralization. She was given education on specific activities to promote bone health.  3. Cancer screening:  Due to [name]'s history and her age, she should receive screening for skin cancers, colon cancer, and gynecologic cancers.  The information and recommendations are listed on the patient's comprehensive care plan/treatment summary and were reviewed in detail with the patient.    4. Health maintenance and wellness promotion: [name] was encouraged to consume 5-7 servings of fruits and vegetables per day. We reviewed the "Nutrition Rainbow" handout, as well as the handout "Take Control of Your Health and Reduce Your Cancer Risk" from the [place].  She was also encouraged to engage in moderate to vigorous exercise for 30 minutes per day most days of the week. We discussed the [employer] fitness program, which is designed for cancer survivors to help them become more physically fit after cancer treatments.  She was instructed to limit her alcohol consumption and continue to abstain from tobacco use.     5. Support services/counseling:  It is not uncommon for this period of the patient's cancer care trajectory to be one of many emotions and stressors.  We discussed an opportunity for her to participate in the next session of [facility] ("Finding Your New Normal") support group series designed for patients after they have completed treatment.   [name] was encouraged to take advantage of our many other support services programs, support groups, and/or counseling in coping with her new life as a cancer survivor after completing anti-cancer treatment.  She was offered support today through active listening and expressive supportive counseling.  She was given information regarding our available services and encouraged to contact me with any questions or for help enrolling in any of our support group/programs.    Dispo:   -Return to cancer center in 08/2017 with Dr. Jana Hakim -Mammogram due in 01/2018 -Follow up with Dr. Donne Hazel in 05/2017 -She is welcome to return back to the Survivorship Clinic at any time; no additional follow-up needed at this time.  -Consider referral back to survivorship as a long-term survivor for continued surveillance  A total of (30) minutes of face-to-face time was spent with this patient with greater than 50% of that time in counseling and care-coordination.   Gardenia Phlegm, NP Survivorship Program [place] [phone]   Note: PRIMARY CARE PROVIDER Patient, No Pcp Per None None

## 2018-04-28 DIAGNOSIS — C50511 Malignant neoplasm of lower-outer quadrant of right female breast: Secondary | ICD-10-CM | POA: Diagnosis not present

## 2018-07-08 ENCOUNTER — Telehealth: Payer: Self-pay

## 2018-07-08 NOTE — Telephone Encounter (Signed)
Nurse spoke with patient regarding new pain in right breast.  Pt had right breast lumpectomy 03/2017.  Patient denies redness or drainage.  Small amount of swelling.  Soreness and discomfort X 2 weeks.    Nurse reviewed with provider, per provider potentially could be residual affects from lumpectomy.  Pain not typically an indicator for pain.  Per provider, if patient is more comfortable with him assessing he will be happy to see.   Nurse reviewed with patient above information.  Pt voiced she would feel more comfortable seeing provider for reassurance.  Apt made.  No further needs at this time.

## 2018-07-09 NOTE — Progress Notes (Signed)
[facility]  [phone] [phone]     ID: Kelsey Henry DOB: 12-07-1965  MR#: [id number]  FGH#:[id number]  Patient Care Team: Patient, No Pcp Per as PCP - General (General Practice) Tiffane Sheldon, Virgie Dad, MD as Consulting Physician (Oncology) Kyung Rudd, MD as Consulting Physician (Radiation Oncology) Rolm Bookbinder, MD as Consulting Physician (General Surgery) Meisinger, Sherren Mocha, MD as Consulting Physician (Obstetrics and Gynecology) Loletta Specter, MD (Unknown Physician Specialty) Delice Bison, Charlestine Massed, NP as Nurse Practitioner (Hematology and Oncology) OTHER MD:  CHIEF COMPLAINT: triple negative breast cancer  CURRENT TREATMENT: observation   HISTORY OF CURRENT ILLNESS: From the original intake note:  [name] had routine screening mammography October 2018 showing a possible right breast mass.  She was scheduled for unilateral right mammography and tomography with right breast ultrasonography at the breast center February 06, 2017.  This showed the breast density to be category D.  In the lower inner quadrant of the right breast there was a 0.6 cm oval mass, which by ultrasound measured 0.9 cm and was irregular and hypoechoic.  Ultrasound of the right axilla was sonographically benign.  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  The patient's subsequent history is as detailed below.   INTERVAL HISTORY: Lakya returns today for follow-up of her triple negative breast cancer. She presents with new pain in her right breast.   Since her last visit, she underwent diagnostic bilateral mammography on 02/08/2018. Results showed: breast density category C; no evidence of malignancy in either breast.   REVIEW  OF SYSTEMS: Arnesia reports the pain has subsided, but she still reports some soreness. She states she played 6 games of bowling several weeks ago, and the pain began after that. She also notes she went to the [place] in the beginning of February and enjoyed herself very much. She states she is walking and lifting weights at home. The patient denies unusual headaches, visual changes, nausea, vomiting, stiff neck, dizziness, or gait imbalance. There has been no cough, phlegm production, or pleurisy, no chest pain or pressure, and no change in bowel or bladder habits. The patient denies fever, rash, bleeding, unexplained fatigue or unexplained weight loss. A detailed review of systems was otherwise entirely negative.   PAST MEDICAL HISTORY: Past Medical History:  Diagnosis Date  . Cancer [facility])    breast  . Genetic testing 02/19/2017   STAT Breast panel with reflext to Multi-Cancer panel (83 genes) @ [facility] - No pathogenic mutations detected  . GERD (gastroesophageal reflux disease)    no meds  . Personal history of chemotherapy    2019  . Personal history of radiation therapy    2019  . Seasonal allergies   . SVD (spontaneous vaginal delivery)    x 1    PAST SURGICAL HISTORY: Past Surgical History:  Procedure Laterality Date  . ABDOMINAL HYSTERECTOMY    . BILATERAL SALPINGECTOMY Bilateral 06/23/2012   Procedure: BILATERAL SALPINGECTOMY;  Surgeon: Cheri Fowler, MD;  Location: [facility];  Service: Gynecology;  Laterality: Bilateral;  . BREAST LUMPECTOMY Right 03/23/2017  . BREAST LUMPECTOMY WITH RADIOACTIVE SEED AND SENTINEL LYMPH NODE BIOPSY Right 03/23/2017   Procedure: BREAST LUMPECTOMY WITH RADIOACTIVE SEED AND SENTINEL LYMPH NODE BIOPSY;  Surgeon: Rolm Bookbinder, MD;  Location: [facility];  Service: General;  Laterality: Right;  . LAPAROSCOPIC SUPRACERVICAL HYSTERECTOMY N/A 06/23/2012   Procedure: LAPAROSCOPIC SUPRACERVICAL HYSTERECTOMY;  Surgeon: Cheri Fowler, MD;  Location: [facility];   Service: Gynecology;  Laterality: N/A;  . PORT-A-CATH REMOVAL N/A 08/13/2017   Procedure: REMOVAL PORT-A-CATH;  Surgeon: Rolm Bookbinder, MD;  Location: [facility];  Service: General;  Laterality: N/A;  . PORTACATH PLACEMENT Right 03/23/2017   Procedure: INSERTION PORT-A-CATH;  Surgeon: Rolm Bookbinder, MD;  Location: [facility];  Service: General;  Laterality: Right;  . TUBAL LIGATION    . VULVAR LESION REMOVAL N/A 11/01/2014   Procedure: MONS PUBIS LESION;  Surgeon: Cheri Fowler, MD;  Location: [facility];  Service: Gynecology;  Laterality: N/A;  local anesthesia with IV sedation if needed  . WISDOM TOOTH EXTRACTION      FAMILY HISTORY Family History  Problem Relation Age of Onset  . Hypertension Mother   . Diabetes Mellitus II Mother   . Hypertension Sister   . Hypertension Sister   . Breast cancer Neg Hx   . Pancreatic cancer Neg Hx   . Colon cancer Neg Hx   The patient has no information regarding her father or his side of the family.  The patient's mother is 64 years old as of November 2018.  The patient has 3 brothers, 2 sisters.  One sister had 2 "benign tumors" removed from the right breast at the age of 32.  A maternal grandfather had prostate cancer in his 48s.  GYNECOLOGIC HISTORY:  Patient's last menstrual period was 05/26/2012. Menarche age 51, first live birth age 24, the patient is GX P1.  She stopped having periods 2012 when she underwent hysterectomy, without salpingo-oophorectomy..  She used hormone replacement for approximately 3 months.  She is used oral contraceptives remotely with no complications.  SOCIAL HISTORY:  Ahliya works as an Psychologist, educational for [employer], in the paternal DNA subsection.  Her husband Lennette Bihari is an Clinical biochemist. Their son [name] is an Producer, television/film/video and lives in [place].  The patient has no grandchildren.  She is a Psychologist, forensic.    ADVANCED DIRECTIVES: Not in place   HEALTH MAINTENANCE: Social History   Tobacco Use  . Smoking  status: Never Smoker  . Smokeless tobacco: Never Used  Substance Use Topics  . Alcohol use: No  . Drug use: No     Colonoscopy: n/a  PAP: Status post hysterectomy  Bone density: n/a   Allergies  Allergen Reactions  . Aspirin Hives    Current Outpatient Medications  Medication Sig Dispense Refill  . loratadine (CLARITIN) 10 MG tablet Take 10 mg by mouth daily as needed for allergies.    . Multiple Vitamins-Minerals (MULTIVITAMIN ADULT) TABS Take by mouth.    Marland Kitchen omeprazole (PRILOSEC) 40 MG capsule Take 1 capsule (40 mg total) by mouth daily. 90 capsule 3  . Probiotic Product (PROBIOTIC-10 PO) Take 1 capsule by mouth daily.    . vitamin C (ASCORBIC ACID) 250 MG tablet Take 250 mg by mouth daily.     No current facility-administered medications for this visit.     OBJECTIVE: Middle-aged African-American woman who appears well  Vitals:   07/12/18 1106  BP: 125/82  Pulse: 78  Resp: 18  Temp: 98.9 F (37.2 C)  SpO2: 100%     Body mass index is 21.92 kg/m.   Wt Readings from Last 3 Encounters:  07/12/18 116 lb (52.6 kg)  03/01/18 123 lb 14.4 oz (56.2 kg)  12/09/17 125 lb (56.7 kg)  ECOG FS:0 - Asymptomatic  Sclerae unicteric, pupils round and equal No cervical or supraclavicular adenopathy Lungs no rales or rhonchi Heart regular rate and rhythm Abd soft, nontender, positive bowel sounds MSK no focal spinal tenderness, no upper extremity lymphedema Neuro: nonfocal, well oriented, appropriate affect Breasts: The right breast is status post lumpectomy and radiation.  There are no skin or nipple changes of concern.  The breast itself is lumpy, as expected in a young woman but there are no findings suspicious for disease recurrence.  The left breast is equally lumpy.  Both axillae are benign.   LAB RESULTS:  CMP     Component Value Date/Time   NA 145 03/01/2018 0818   NA 142 02/18/2017 1221   K 4.9 03/01/2018 0818   K 4.6 02/18/2017 1221   CL 107 03/01/2018  0818   CO2 28 03/01/2018 0818   CO2 27 02/18/2017 1221   GLUCOSE 91 03/01/2018 0818   GLUCOSE 95 02/18/2017 1221   BUN 17 03/01/2018 0818   BUN 18.4 02/18/2017 1221   CREATININE 1.05 (H) 03/01/2018 0818   CREATININE 1.0 02/18/2017 1221   CALCIUM 10.0 03/01/2018 0818   CALCIUM 10.3 02/18/2017 1221   PROT 7.7 03/01/2018 0818   PROT 8.3 02/18/2017 1221   ALBUMIN 4.1 03/01/2018 0818   ALBUMIN 4.4 02/18/2017 1221   AST 38 03/01/2018 0818   AST 22 02/18/2017 1221   ALT 34 03/01/2018 0818   ALT 18 02/18/2017 1221   ALKPHOS 112 03/01/2018 0818   ALKPHOS 89 02/18/2017 1221   BILITOT 0.3 03/01/2018 0818   BILITOT 0.48 02/18/2017 1221   GFRNONAA >60 03/01/2018 0818   GFRAA >60 03/01/2018 0818    No results found for: TOTALPROTELP, ALBUMINELP, A1GS, A2GS, BETS, BETA2SER, GAMS, MSPIKE, SPEI  No results found for: Nils Pyle, [facility]  Lab Results  Component Value Date   WBC 6.3 03/01/2018   NEUTROABS 4.0 03/01/2018   HGB 13.4 03/01/2018   HCT 41.3 03/01/2018   MCV 89.0 03/01/2018   PLT 240 03/01/2018      Chemistry      Component Value Date/Time   NA 145 03/01/2018 0818   NA 142 02/18/2017 1221   K 4.9 03/01/2018 0818   K 4.6 02/18/2017 1221   CL 107 03/01/2018 0818   CO2 28 03/01/2018 0818   CO2 27 02/18/2017 1221   BUN 17 03/01/2018 0818   BUN 18.4 02/18/2017 1221   CREATININE 1.05 (H) 03/01/2018 0818   CREATININE 1.0 02/18/2017 1221      Component Value Date/Time   CALCIUM 10.0 03/01/2018 0818   CALCIUM 10.3 02/18/2017 1221   ALKPHOS 112 03/01/2018 0818   ALKPHOS 89 02/18/2017 1221   AST 38 03/01/2018 0818   AST 22 02/18/2017 1221   ALT 34 03/01/2018 0818   ALT 18 02/18/2017 1221   BILITOT 0.3 03/01/2018 0818   BILITOT 0.48 02/18/2017 1221       No results found for: LABCA2  No components found for: [id number]  No results for input(s): INR in the last 168 hours.  No results found for: LABCA2  No results found for: BLT903  No  results found for: ESP233  No results found for: AQT622  No results found for: CA2729  No components found for: HGQUANT  No results found for: CEA1 / No results found for: CEA1   No results found for: AFPTUMOR  No results found for: [place]  No results found for: PSA1  No visits with results  within 3 Day(s) from this visit.  Latest known visit with results is:  Appointment on 03/01/2018  Component Date Value Ref Range Status  . Sodium 03/01/2018 145  135 - 145 mmol/L Final  . Potassium 03/01/2018 4.9  3.5 - 5.1 mmol/L Final  . Chloride 03/01/2018 107  98 - 111 mmol/L Final  . CO2 03/01/2018 28  22 - 32 mmol/L Final  . Glucose, Bld 03/01/2018 91  70 - 99 mg/dL Final  . BUN 03/01/2018 17  6 - 20 mg/dL Final  . Creatinine 03/01/2018 1.05* 0.44 - 1.00 mg/dL Final  . Calcium 03/01/2018 10.0  8.9 - 10.3 mg/dL Final  . Total Protein 03/01/2018 7.7  6.5 - 8.1 g/dL Final  . Albumin 03/01/2018 4.1  3.5 - 5.0 g/dL Final  . AST 03/01/2018 38  15 - 41 U/L Final  . ALT 03/01/2018 34  0 - 44 U/L Final  . Alkaline Phosphatase 03/01/2018 112  38 - 126 U/L Final  . Total Bilirubin 03/01/2018 0.3  0.3 - 1.2 mg/dL Final  . GFR, Est Non Af Am 03/01/2018 >60  >60 mL/min Final  . GFR, Est AFR Am 03/01/2018 >60  >60 mL/min Final   Comment: (NOTE) The eGFR has been calculated using the CKD EPI equation. This calculation has not been validated in all clinical situations. eGFR's persistently <60 mL/min signify possible Chronic Kidney Disease.   Georgiann Hahn gap 03/01/2018 10  5 - 15 Final   Performed at [facility], [place] [address], [place], [place] [address]  . WBC Count 03/01/2018 6.3  4.0 - 10.5 K/uL Final  . RBC 03/01/2018 4.64  3.87 - 5.11 MIL/uL Final  . Hemoglobin 03/01/2018 13.4  12.0 - 15.0 g/dL Final  . HCT 03/01/2018 41.3  36.0 - 46.0 % Final  . MCV 03/01/2018 89.0  80.0 - 100.0 fL Final  . MCH 03/01/2018 28.9  26.0 - 34.0 pg Final  . MCHC 03/01/2018 32.4   30.0 - 36.0 g/dL Final  . RDW 03/01/2018 12.3  11.5 - 15.5 % Final  . Platelet Count 03/01/2018 240  150 - 400 K/uL Final  . nRBC 03/01/2018 0.0  0.0 - 0.2 % Final  . Neutrophils Relative % 03/01/2018 63  % Final  . Neutro Abs 03/01/2018 4.0  1.7 - 7.7 K/uL Final  . Lymphocytes Relative 03/01/2018 25  % Final  . Lymphs Abs 03/01/2018 1.5  0.7 - 4.0 K/uL Final  . Monocytes Relative 03/01/2018 7  % Final  . Monocytes Absolute 03/01/2018 0.4  0.1 - 1.0 K/uL Final  . Eosinophils Relative 03/01/2018 4  % Final  . Eosinophils Absolute 03/01/2018 0.2  0.0 - 0.5 K/uL Final  . Basophils Relative 03/01/2018 0  % Final  . Basophils Absolute 03/01/2018 0.0  0.0 - 0.1 K/uL Final  . Immature Granulocytes 03/01/2018 1  % Final  . Abs Immature Granulocytes 03/01/2018 0.03  0.00 - 0.07 K/uL Final   Performed at [facility], [place] [address], [place],  [address]    (this displays the last labs from the last 3 days)  No results found for: TOTALPROTELP, ALBUMINELP, A1GS, A2GS, BETS, BETA2SER, GAMS, MSPIKE, SPEI (this displays SPEP labs)  No results found for: KPAFRELGTCHN, LAMBDASER, KAPLAMBRATIO (kappa/lambda light chains)  No results found for: HGBA, HGBA2QUANT, HGBFQUANT, HGBSQUAN (Hemoglobinopathy evaluation)   No results found for: LDH  No results found for: IRON, TIBC, IRONPCTSAT (Iron and TIBC)  No results found for:  FERRITIN  Urinalysis No results found for: COLORURINE, APPEARANCEUR, LABSPEC, PHURINE, GLUCOSEU, HGBUR, BILIRUBINUR, KETONESUR, PROTEINUR, UROBILINOGEN, NITRITE, LEUKOCYTESUR   STUDIES: No results found.  ELIGIBLE FOR AVAILABLE RESEARCH PROTOCOL: UPBEAT; did not qualify for BR 003  ASSESSMENT: 53 y.o. [place] woman status post right breast lower inner quadrant biopsy February 09, 2017 for a clinical T1b N0, stage IB invasive ductal carcinoma, grade 3, triple negative  (1) status post right lumpectomy 03/23/2017 for a pT1b pN0, stage IB  invasive ductal carcinoma, grade 3, with negative margins.  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 The patient then received a boost to the seroma. This delivered an additional 10 Gy in 5 fractions. The total dose was 60.4 Gy.   (4) genetics testing 02/19/2017 through the STAT Breast panel with reflext to Multi-Cancer panel (83 genes) @ [facility] - No pathogenic mutations detected in ALK, APC, ATM, AXIN2, BAP1, BARD1, BLM, BMPR1A, BRCA1, BRCA2, BRIP1, CASR, CDC73, CDH1, CDK4, CDKN1B, CDKN1C, CDKN2A, CEBPA, CHEK2, CTNNA1, DICER1, DIS3L2, EGFR, EPCAM, FH, FLCN, GATA2, GPC3, GREM1, HOXB13, HRAS, KIT, MAX, MEN1, MET, MITF, MLH1, MSH2, MSH3, MSH6, MUTYH, NBN, NF1, NF2, NTHL1, PALB2, PDGFRA, PHOX2B, PMS2, POLD1, POLE, POT1, PRKAR1A, PTCH1, PTEN, RAD50, RAD51C, RAD51D, RB1, RECQL4, RET, RUNX1, SDHA, SDHAF2, SDHB, SDHC, SDHD, SMAD4, SMARCA4, SMARCB1, SMARCE1, STK11, SUFU, TERC, TERT, TMEM127, TP53, TSC1, TSC2, VHL, WRN, WT1.  (a) Variants of Uncertain Significance in PDGFRA c.3040G>A (p.Ala1014Thr) and SMARCA4 c.2044C>G (p.Leu682Val) noted  PLAN: Jamee's right breast discomfort is likely due to her intense bullying experience.  I think she can do bowling but she will have to train the right chest wall by doing extra stretching exercises otherwise she will tear some of the scar tissue from her surgery and I think that is what happened this time.  I reassured her that pain in the breast is almost never related to cancer.  We discussed the fact that as a young woman her breasts are difficult to examine.  Nevertheless at this point I really do not see  an indication to proceeding to right mammography or ultrasonography.  This is scheduled for October.  She already has an appointment with me in June.  She will keep that  She knows to call for any other issue that may develop before the next visit.   Bee Marchiano, Virgie Dad, MD  07/12/18 11:26 AM Medical Oncology and Hematology [facility] [address] [place], [place] [address] Tel. [phone]    Fax. [phone]   I, Wilburn Mylar, am acting as scribe for Dr. Virgie Dad. Lattie Riege.  I, Lurline Del MD, have reviewed the above documentation for accuracy and completeness, and I agree with the above.

## 2018-07-12 ENCOUNTER — Inpatient Hospital Stay: Payer: BLUE CROSS/BLUE SHIELD | Attending: Oncology | Admitting: Oncology

## 2018-07-12 ENCOUNTER — Other Ambulatory Visit: Payer: Self-pay

## 2018-07-12 VITALS — BP 125/82 | HR 78 | Temp 98.9°F | Resp 18 | Ht 61.0 in | Wt 116.0 lb

## 2018-07-12 DIAGNOSIS — N644 Mastodynia: Secondary | ICD-10-CM

## 2018-07-12 DIAGNOSIS — C50311 Malignant neoplasm of lower-inner quadrant of right female breast: Secondary | ICD-10-CM | POA: Diagnosis not present

## 2018-07-12 DIAGNOSIS — Z171 Estrogen receptor negative status [ER-]: Secondary | ICD-10-CM | POA: Diagnosis not present

## 2018-09-29 NOTE — Progress Notes (Signed)
Kelsey Henry  [phone] [phone]     ID: Kelsey Henry DOB: January 14, 1966  MR#: [id number]  NLG#:[id number]  Patient Care Team: Patient, No Pcp Per as PCP - General (General Practice) Skylar Flynt, Virgie Dad, MD as Consulting Physician (Oncology) Kyung Rudd, MD as Consulting Physician (Radiation Oncology) Rolm Bookbinder, MD as Consulting Physician (General Surgery) Meisinger, Sherren Mocha, MD as Consulting Physician (Obstetrics and Gynecology) Loletta Specter, MD (Unknown Physician Specialty) Delice Bison, Charlestine Massed, NP as Nurse Practitioner (Hematology and Oncology) OTHER MD:  CHIEF COMPLAINT: triple negative breast cancer  CURRENT TREATMENT: observation   INTERVAL HISTORY: Kelsey Henry returns today for follow-up of her triple negative breast cancer. She continues on observation.  Her last mammogram was in 01/2018.  Since her last visit, she has not undergone any additional studies.   REVIEW OF SYSTEMS: Kelsey Henry reports she has been furloughed. She walks and runs for exercise. She denies any diet issues. She lives at home with her husband and her mother-in-law, who is 61. Her husband continues to work outside the home. He wears a mask and showers when he gets home. A detailed review of systems was otherwise entirely negative.   HISTORY OF CURRENT ILLNESS: From the original intake note:  Sonnet had routine screening mammography October 2018 showing a possible right breast mass.  She was scheduled for unilateral right mammography and tomography with right breast ultrasonography at the breast center February 06, 2017.  This showed the breast density to be category D.  In the lower inner quadrant of the right breast there was a 0.6 cm oval mass, which by ultrasound measured 0.9 cm and was irregular and hypoechoic.  Ultrasound of the right axilla was sonographically benign.  On February 09, 2017 she underwent biopsy of the right breast mass in question, and  this showed ([facility] [phone]) invasive [ductal] carcinoma, grade 3, estrogen and progesterone receptor negative, HER-2/neu not amplified, with a sickness ratio of 1.23 and the number per cell 2.9.  The patient's subsequent history is as detailed below.   PAST MEDICAL HISTORY: Past Medical History:  Diagnosis Date   Cancer [facility])    breast   Genetic testing 02/19/2017   STAT Breast panel with reflext to Multi-Cancer panel (83 genes) @ [facility] - No pathogenic mutations detected   GERD (gastroesophageal reflux disease)    no meds   Personal history of chemotherapy    2019   Personal history of radiation therapy    2019   Seasonal allergies    SVD (spontaneous vaginal delivery)    x 1    PAST SURGICAL HISTORY: Past Surgical History:  Procedure Laterality Date   ABDOMINAL HYSTERECTOMY     BILATERAL SALPINGECTOMY Bilateral 06/23/2012   Procedure: BILATERAL SALPINGECTOMY;  Surgeon: Cheri Fowler, MD;  Location: [facility];  Service: Gynecology;  Laterality: Bilateral;   BREAST LUMPECTOMY Right 03/23/2017   BREAST LUMPECTOMY WITH RADIOACTIVE SEED AND SENTINEL LYMPH NODE BIOPSY Right 03/23/2017   Procedure: BREAST LUMPECTOMY WITH RADIOACTIVE SEED AND SENTINEL LYMPH NODE BIOPSY;  Surgeon: Rolm Bookbinder, MD;  Location: [place];  Service: General;  Laterality: Right;   LAPAROSCOPIC SUPRACERVICAL HYSTERECTOMY N/A 06/23/2012   Procedure: LAPAROSCOPIC SUPRACERVICAL HYSTERECTOMY;  Surgeon: Cheri Fowler, MD;  Location: [facility];  Service: Gynecology;  Laterality: N/A;   PORT-A-CATH REMOVAL N/A 08/13/2017   Procedure: REMOVAL PORT-A-CATH;  Surgeon: Rolm Bookbinder, MD;  Location: [place];  Service: General;  Laterality: N/A;   PORTACATH PLACEMENT Right 03/23/2017   Procedure: INSERTION PORT-A-CATH;  Surgeon: Rolm Bookbinder, MD;  Location: [place];  Service: General;  Laterality: Right;   TUBAL LIGATION     VULVAR LESION REMOVAL N/A 11/01/2014   Procedure:  MONS PUBIS LESION;  Surgeon: Cheri Fowler, MD;  Location: [facility];  Service: Gynecology;  Laterality: N/A;  local anesthesia with IV sedation if needed   WISDOM TOOTH EXTRACTION      FAMILY HISTORY Family History  Problem Relation Age of Onset   Hypertension Mother    Diabetes Mellitus II Mother    Hypertension Sister    Hypertension Sister    Breast cancer Neg Hx    Pancreatic cancer Neg Hx    Colon cancer Neg Hx   The patient has no information regarding her father or his side of the family.  The patient's mother is 65 years old as of November 2018.  The patient has 3 brothers, 2 sisters.  One sister had 2 "benign tumors" removed from the right breast at the age of 58.  A maternal grandfather had prostate cancer in his 9s.   GYNECOLOGIC HISTORY:  Patient's last menstrual period was 05/26/2012. Menarche age 68, first live birth age 38, the patient is GX P1.  She stopped having periods 2012 when she underwent hysterectomy, without salpingo-oophorectomy..  She used hormone replacement for approximately 3 months.  She is used oral contraceptives remotely with no complications.   SOCIAL HISTORY:  Kelsey Henry works as an Psychologist, educational for [employer], in the paternal DNA subsection.  Her husband Kelsey Henry is an Clinical biochemist. Their son Kelsey Henry is an Producer, television/film/video and lives in [place].  The patient has no grandchildren.  She is a Psychologist, forensic.    ADVANCED DIRECTIVES: Not in place   HEALTH MAINTENANCE: Social History   Tobacco Use   Smoking status: Never Smoker   Smokeless tobacco: Never Used  Substance Use Topics   Alcohol use: No   Drug use: No     Colonoscopy: n/a  PAP: Status post hysterectomy  Bone density: n/a   Allergies  Allergen Reactions   Aspirin Hives    Current Outpatient Medications  Medication Sig Dispense Refill   loratadine (CLARITIN) 10 MG tablet Take 10 mg by mouth daily as needed for allergies.     Multiple Vitamins-Minerals (MULTIVITAMIN ADULT) TABS  Take by mouth.     omeprazole (PRILOSEC) 40 MG capsule Take 1 capsule (40 mg total) by mouth daily. 90 capsule 3   Probiotic Product (PROBIOTIC-10 PO) Take 1 capsule by mouth daily.     vitamin C (ASCORBIC ACID) 250 MG tablet Take 250 mg by mouth daily.     No current facility-administered medications for this visit.     OBJECTIVE: Middle-aged African-American woman in no acute distress  Vitals:   09/30/18 0910  BP: 122/76  Pulse: 81  Resp: 19  Temp: 98 F (36.7 C)  SpO2: 97%     Body mass index is 21.96 kg/m.   Wt Readings from Last 3 Encounters:  09/30/18 116 lb 3.2 oz (52.7 kg)  07/12/18 116 lb (52.6 kg)  03/01/18 123 lb 14.4 oz (56.2 kg)      ECOG FS:0 - Asymptomatic  Sclerae unicteric, EOMs intact No cervical or supraclavicular adenopathy Lungs no rales or rhonchi Heart regular rate and rhythm Abd soft, nontender, positive bowel sounds MSK no focal spinal tenderness, no upper extremity lymphedema Neuro: nonfocal, well oriented, appropriate affect Breasts: The right breast is status post lumpectomy followed by radiation.  The cosmetic result is good.  There  is no evidence of local recurrence.  The left breast is unremarkable.  Both axillae are benign.  LAB RESULTS:  CMP     Component Value Date/Time   NA 145 03/01/2018 0818   NA 142 02/18/2017 1221   K 4.9 03/01/2018 0818   K 4.6 02/18/2017 1221   CL 107 03/01/2018 0818   CO2 28 03/01/2018 0818   CO2 27 02/18/2017 1221   GLUCOSE 91 03/01/2018 0818   GLUCOSE 95 02/18/2017 1221   BUN 17 03/01/2018 0818   BUN 18.4 02/18/2017 1221   CREATININE 1.05 (H) 03/01/2018 0818   CREATININE 1.0 02/18/2017 1221   CALCIUM 10.0 03/01/2018 0818   CALCIUM 10.3 02/18/2017 1221   PROT 7.7 03/01/2018 0818   PROT 8.3 02/18/2017 1221   ALBUMIN 4.1 03/01/2018 0818   ALBUMIN 4.4 02/18/2017 1221   AST 38 03/01/2018 0818   AST 22 02/18/2017 1221   ALT 34 03/01/2018 0818   ALT 18 02/18/2017 1221   ALKPHOS 112 03/01/2018  0818   ALKPHOS 89 02/18/2017 1221   BILITOT 0.3 03/01/2018 0818   BILITOT 0.48 02/18/2017 1221   GFRNONAA >60 03/01/2018 0818   GFRAA >60 03/01/2018 0818    No results found for: TOTALPROTELP, ALBUMINELP, A1GS, A2GS, BETS, BETA2SER, GAMS, MSPIKE, SPEI  No results found for: Nils Pyle, [facility]  Lab Results  Component Value Date   WBC 6.3 03/01/2018   NEUTROABS 4.0 03/01/2018   HGB 13.4 03/01/2018   HCT 41.3 03/01/2018   MCV 89.0 03/01/2018   PLT 240 03/01/2018      Chemistry      Component Value Date/Time   NA 145 03/01/2018 0818   NA 142 02/18/2017 1221   K 4.9 03/01/2018 0818   K 4.6 02/18/2017 1221   CL 107 03/01/2018 0818   CO2 28 03/01/2018 0818   CO2 27 02/18/2017 1221   BUN 17 03/01/2018 0818   BUN 18.4 02/18/2017 1221   CREATININE 1.05 (H) 03/01/2018 0818   CREATININE 1.0 02/18/2017 1221      Component Value Date/Time   CALCIUM 10.0 03/01/2018 0818   CALCIUM 10.3 02/18/2017 1221   ALKPHOS 112 03/01/2018 0818   ALKPHOS 89 02/18/2017 1221   AST 38 03/01/2018 0818   AST 22 02/18/2017 1221   ALT 34 03/01/2018 0818   ALT 18 02/18/2017 1221   BILITOT 0.3 03/01/2018 0818   BILITOT 0.48 02/18/2017 1221       No results found for: LABCA2  No components found for: KAJGOT157  No results for input(s): INR in the last 168 hours.  No results found for: LABCA2  No results found for: WIO035  No results found for: DHR416  No results found for: LAG536  No results found for: CA2729  No components found for: HGQUANT  No results found for: CEA1 / No results found for: CEA1   No results found for: AFPTUMOR  No results found for: CHROMOGRNA  No results found for: PSA1  No visits with results within 3 Day(s) from this visit.  Latest known visit with results is:  Appointment on 03/01/2018  Component Date Value Ref Range Status   Sodium 03/01/2018 145  135 - 145 mmol/L Final   Potassium 03/01/2018 4.9  3.5 - 5.1 mmol/L Final    Chloride 03/01/2018 107  98 - 111 mmol/L Final   CO2 03/01/2018 28  22 - 32 mmol/L Final   Glucose, Bld 03/01/2018 91  70 - 99 mg/dL Final   BUN 03/01/2018  17  6 - 20 mg/dL Final   Creatinine 03/01/2018 1.05* 0.44 - 1.00 mg/dL Final   Calcium 03/01/2018 10.0  8.9 - 10.3 mg/dL Final   Total Protein 03/01/2018 7.7  6.5 - 8.1 g/dL Final   Albumin 03/01/2018 4.1  3.5 - 5.0 g/dL Final   AST 03/01/2018 38  15 - 41 U/L Final   ALT 03/01/2018 34  0 - 44 U/L Final   Alkaline Phosphatase 03/01/2018 112  38 - 126 U/L Final   Total Bilirubin 03/01/2018 0.3  0.3 - 1.2 mg/dL Final   GFR, Est Non Af Am 03/01/2018 >60  >60 mL/min Final   GFR, Est AFR Am 03/01/2018 >60  >60 mL/min Final   Comment: (NOTE) The eGFR has been calculated using the CKD EPI equation. This calculation has not been validated in all clinical situations. eGFR's persistently <60 mL/min signify possible Chronic Kidney Disease.    Anion gap 03/01/2018 10  5 - 15 Final   Performed at [facility], [place] [address], [place], [place] [address]   WBC Count 03/01/2018 6.3  4.0 - 10.5 K/uL Final   RBC 03/01/2018 4.64  3.87 - 5.11 MIL/uL Final   Hemoglobin 03/01/2018 13.4  12.0 - 15.0 g/dL Final   HCT 03/01/2018 41.3  36.0 - 46.0 % Final   MCV 03/01/2018 89.0  80.0 - 100.0 fL Final   MCH 03/01/2018 28.9  26.0 - 34.0 pg Final   MCHC 03/01/2018 32.4  30.0 - 36.0 g/dL Final   RDW 03/01/2018 12.3  11.5 - 15.5 % Final   Platelet Count 03/01/2018 240  150 - 400 K/uL Final   nRBC 03/01/2018 0.0  0.0 - 0.2 % Final   Neutrophils Relative % 03/01/2018 63  % Final   Neutro Abs 03/01/2018 4.0  1.7 - 7.7 K/uL Final   Lymphocytes Relative 03/01/2018 25  % Final   Lymphs Abs 03/01/2018 1.5  0.7 - 4.0 K/uL Final   Monocytes Relative 03/01/2018 7  % Final   Monocytes Absolute 03/01/2018 0.4  0.1 - 1.0 K/uL Final   Eosinophils Relative 03/01/2018 4  % Final   Eosinophils Absolute 03/01/2018 0.2   0.0 - 0.5 K/uL Final   Basophils Relative 03/01/2018 0  % Final   Basophils Absolute 03/01/2018 0.0  0.0 - 0.1 K/uL Final   Immature Granulocytes 03/01/2018 1  % Final   Abs Immature Granulocytes 03/01/2018 0.03  0.00 - 0.07 K/uL Final   Performed at [facility], [place] [address], [place], [place] [address]    (this displays the last labs from the last 3 days)  No results found for: TOTALPROTELP, ALBUMINELP, A1GS, A2GS, BETS, BETA2SER, GAMS, MSPIKE, SPEI (this displays SPEP labs)  No results found for: KPAFRELGTCHN, LAMBDASER, KAPLAMBRATIO (kappa/lambda light chains)  No results found for: HGBA, HGBA2QUANT, HGBFQUANT, HGBSQUAN (Hemoglobinopathy evaluation)   No results found for: LDH  No results found for: IRON, TIBC, IRONPCTSAT (Iron and TIBC)  No results found for: FERRITIN  Urinalysis No results found for: COLORURINE, APPEARANCEUR, LABSPEC, PHURINE, GLUCOSEU, HGBUR, BILIRUBINUR, KETONESUR, PROTEINUR, UROBILINOGEN, NITRITE, LEUKOCYTESUR   STUDIES: No results found.  ELIGIBLE FOR AVAILABLE RESEARCH PROTOCOL: UPBEAT; did not qualify for BR 003  ASSESSMENT: 53 y.o. [place] woman status post right breast lower inner quadrant biopsy February 09, 2017 for a clinical T1b N0, stage IB invasive ductal carcinoma, grade 3, triple negative  (1) status post right lumpectomy 03/23/2017 for a pT1b pN0, stage IB invasive  ductal carcinoma, grade 3, with negative margins.  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 The patient then received a boost to the seroma. This delivered an additional 10 Gy in 5  fractions. The total dose was 60.4 Gy.   (4) genetics testing 02/19/2017 through the STAT Breast panel with reflext to Multi-Cancer panel (83 genes) @ [facility] - No pathogenic mutations detected in ALK, APC, ATM, AXIN2, BAP1, BARD1, BLM, BMPR1A, BRCA1, BRCA2, BRIP1, CASR, CDC73, CDH1, CDK4, CDKN1B, CDKN1C, CDKN2A, CEBPA, CHEK2, CTNNA1, DICER1, DIS3L2, EGFR, EPCAM, FH, FLCN, GATA2, GPC3, GREM1, HOXB13, HRAS, KIT, MAX, MEN1, MET, MITF, MLH1, MSH2, MSH3, MSH6, MUTYH, NBN, NF1, NF2, NTHL1, PALB2, PDGFRA, PHOX2B, PMS2, POLD1, POLE, POT1, PRKAR1A, PTCH1, PTEN, RAD50, RAD51C, RAD51D, RB1, RECQL4, RET, RUNX1, SDHA, SDHAF2, SDHB, SDHC, SDHD, SMAD4, SMARCA4, SMARCB1, SMARCE1, STK11, SUFU, TERC, TERT, TMEM127, TP53, TSC1, TSC2, VHL, WRN, WT1.  (a) Variants of Uncertain Significance in PDGFRA c.3040G>A (p.Ala1014Thr) and SMARCA4 c.2044C>G (p.Leu682Val) noted  PLAN: Louann is now 2-1/2 years out from definitive surgery for her breast cancer with no evidence of disease recurrence.  This is very favorable.  She looks the picture of health and has an excellent exercise and diet program.  She just turned 52.  I am setting her up with Dr. Collene Mares for screening colonoscopy.  She does not have a primary care physician.  We will see if we can set her up with 1 of the lobe our group women doctors  She will have her mammogram in October and see her gynecologist at that time.  Otherwise she will see me again in 1 year.  She knows to call for any other issue that may develop before that visit.    Emorie Mcfate, Virgie Dad, MD  09/30/18 9:23 AM Medical Oncology and Hematology [facility] [address] [place], [place] [address] Tel. [phone]    Fax. [phone]   I, Wilburn Mylar, am acting as scribe for Dr. Virgie Dad. Demarcus Thielke.  I, Lurline Del MD, have reviewed the above documentation for accuracy and completeness, and I agree with the above.

## 2018-09-30 ENCOUNTER — Inpatient Hospital Stay: Payer: BC Managed Care – PPO | Attending: Oncology | Admitting: Oncology

## 2018-09-30 ENCOUNTER — Telehealth: Payer: Self-pay

## 2018-09-30 ENCOUNTER — Encounter: Payer: Self-pay | Admitting: Oncology

## 2018-09-30 ENCOUNTER — Other Ambulatory Visit: Payer: Self-pay

## 2018-09-30 VITALS — BP 122/76 | HR 81 | Temp 98.0°F | Resp 19 | Ht 61.0 in | Wt 116.2 lb

## 2018-09-30 DIAGNOSIS — C50311 Malignant neoplasm of lower-inner quadrant of right female breast: Secondary | ICD-10-CM | POA: Insufficient documentation

## 2018-09-30 DIAGNOSIS — Z171 Estrogen receptor negative status [ER-]: Secondary | ICD-10-CM

## 2018-09-30 NOTE — Telephone Encounter (Signed)
A new pt appt has been made for pt at [facility] in [place]. Pt is aware of appt date, time and instructions given for virtual appt.

## 2018-10-04 ENCOUNTER — Telehealth: Payer: Self-pay | Admitting: General Practice

## 2018-10-04 ENCOUNTER — Ambulatory Visit (INDEPENDENT_AMBULATORY_CARE_PROVIDER_SITE_OTHER): Payer: BC Managed Care – PPO | Admitting: Family

## 2018-10-04 ENCOUNTER — Encounter: Payer: Self-pay | Admitting: Family

## 2018-10-04 ENCOUNTER — Other Ambulatory Visit: Payer: Self-pay

## 2018-10-04 DIAGNOSIS — K219 Gastro-esophageal reflux disease without esophagitis: Secondary | ICD-10-CM | POA: Diagnosis not present

## 2018-10-04 DIAGNOSIS — C50311 Malignant neoplasm of lower-inner quadrant of right female breast: Secondary | ICD-10-CM

## 2018-10-04 DIAGNOSIS — J302 Other seasonal allergic rhinitis: Secondary | ICD-10-CM

## 2018-10-04 DIAGNOSIS — Z171 Estrogen receptor negative status [ER-]: Secondary | ICD-10-CM | POA: Diagnosis not present

## 2018-10-04 NOTE — Progress Notes (Addendum)
Virtual Visit via Video Note  I connected with Kelsey Henry on 10/04/18 at 10:00 AM EDT by a video enabled telemedicine application and verified that I am speaking with the correct person using two identifiers.  Location: Patient: parked car Provider: home   I discussed the limitations of evaluation and management by telemedicine and the availability of in person appointments. The patient expressed understanding and agreed to proceed.  History of Present Illness:   Patient is a 53 yr old female who presents today to establish care.  Pmhx is significant for breast cancer.  This was treated with lumpectomy, radioactive seed implant.  She is followed by Dr. Jana Hakim (oncology).  Seasonal allergies- uses claritin prn, but not needing currently.   GERD- reports that she watches her diet. Not on any PPI.   Reports that she was furloughed from her job.    Gen: reports weight has been stable. Resp: denies cough cough Cv: denies sob/chest pain GI: denies constipation/diarrhea/blood in stool Urology: denies dysuria/frequency, hematuria Neuro: denies frequent HA Skin: denies moles that have changed or skin rashes Lymph: denies lymphadenopathy Psych: denies depression/anxiety  Past Medical History:  Diagnosis Date  . Cancer [facility])    breast  . Genetic testing 02/19/2017   STAT Breast panel with reflext to Multi-Cancer panel (83 genes) @ [facility] - No pathogenic mutations detected  . GERD (gastroesophageal reflux disease)    no meds  . Personal history of chemotherapy    2019  . Personal history of radiation therapy    2019  . Seasonal allergies   . SVD (spontaneous vaginal delivery)    x 1     Social History   Socioeconomic History  . Marital status: Married    Spouse name: Not on file  . Number of children: Not on file  . Years of education: Not on file  . Highest education level: Not on file  Occupational History  . Not on file  Social Needs  . Financial resource  strain: Not on file  . Food insecurity    Worry: Not on file    Inability: Not on file  . Transportation needs    Medical: Not on file    Non-medical: Not on file  Tobacco Use  . Smoking status: Never Smoker  . Smokeless tobacco: Never Used  Substance and Sexual Activity  . Alcohol use: No  . Drug use: No  . Sexual activity: Yes    Partners: Male    Birth control/protection: Surgical  Lifestyle  . Physical activity    Days per week: Not on file    Minutes per session: Not on file  . Stress: Not on file  Relationships  . Social Herbalist on phone: Not on file    Gets together: Not on file    Attends religious service: Not on file    Active member of club or organization: Not on file    Attends meetings of clubs or organizations: Not on file    Relationship status: Not on file  . Intimate partner violence    Fear of current or ex partner: No    Emotionally abused: No    Physically abused: No    Forced sexual activity: No  Other Topics Concern  . Not on file  Social History Narrative  . Not on file    Past Surgical History:  Procedure Laterality Date  . ABDOMINAL HYSTERECTOMY    . BILATERAL SALPINGECTOMY Bilateral 06/23/2012   Procedure:  BILATERAL SALPINGECTOMY;  Surgeon: Cheri Fowler, MD;  Location: [facility];  Service: Gynecology;  Laterality: Bilateral;  . BREAST LUMPECTOMY Right 03/23/2017  . BREAST LUMPECTOMY WITH RADIOACTIVE SEED AND SENTINEL LYMPH NODE BIOPSY Right 03/23/2017   Procedure: BREAST LUMPECTOMY WITH RADIOACTIVE SEED AND SENTINEL LYMPH NODE BIOPSY;  Surgeon: Rolm Bookbinder, MD;  Location: [facility];  Service: General;  Laterality: Right;  . LAPAROSCOPIC SUPRACERVICAL HYSTERECTOMY N/A 06/23/2012   Procedure: LAPAROSCOPIC SUPRACERVICAL HYSTERECTOMY;  Surgeon: Cheri Fowler, MD;  Location: [facility];  Service: Gynecology;  Laterality: N/A;  . PORT-A-CATH REMOVAL N/A 08/13/2017   Procedure: REMOVAL PORT-A-CATH;  Surgeon: Rolm Bookbinder, MD;   Location: [place];  Service: General;  Laterality: N/A;  . PORTACATH PLACEMENT Right 03/23/2017   Procedure: INSERTION PORT-A-CATH;  Surgeon: Rolm Bookbinder, MD;  Location: [place];  Service: General;  Laterality: Right;  . TUBAL LIGATION    . VULVAR LESION REMOVAL N/A 11/01/2014   Procedure: MONS PUBIS LESION;  Surgeon: Cheri Fowler, MD;  Location: [facility];  Service: Gynecology;  Laterality: N/A;  local anesthesia with IV sedation if needed  . WISDOM TOOTH EXTRACTION      Family History  Problem Relation Age of Onset  . Hypertension Mother   . Diabetes Mellitus II Mother   . Hypertension Sister   . Hypertension Sister   . Breast cancer Neg Hx   . Pancreatic cancer Neg Hx   . Colon cancer Neg Hx     Allergies  Allergen Reactions  . Aspirin Hives    Current Outpatient Medications on File Prior to Visit  Medication Sig Dispense Refill  . loratadine (CLARITIN) 10 MG tablet Take 10 mg by mouth daily as needed for allergies.    . Multiple Vitamins-Minerals (MULTIVITAMIN ADULT) TABS Take by mouth.    Marland Kitchen omeprazole (PRILOSEC) 40 MG capsule Take 1 capsule (40 mg total) by mouth daily. 90 capsule 3  . Probiotic Product (PROBIOTIC-10 PO) Take 1 capsule by mouth daily.    . vitamin C (ASCORBIC ACID) 250 MG tablet Take 250 mg by mouth daily.     No current facility-administered medications on file prior to visit.       Observations/Objective:   Gen: Awake, alert, no acute distress Resp: Breathing is even and non-labored Psych: calm/pleasant demeanor Neuro: Alert and Oriented x 3, + facial symmetry, speech is clear.   Assessment and Plan:  Hx of breast cancer- clinically stable. Management per oncology.  GERD- stable with careful diet intake.  Hx of seasonal allergies- stable currently. Uses claritin as needed.   Follow Up Instructions:    I discussed the assessment and treatment plan with the patient. The patient was provided an opportunity to ask  questions and all were answered. The patient agreed with the plan and demonstrated an understanding of the instructions.   The patient was advised to call back or seek an in-person evaluation if the symptoms worsen or if the condition fails to improve as anticipated.  Nance Pear, NP

## 2018-10-04 NOTE — Telephone Encounter (Signed)
Return in about 4 weeks (around 11/01/2018) for annual physical. LEFT VM FOR PT TO CALL BACK

## 2018-10-12 DIAGNOSIS — Z1211 Encounter for screening for malignant neoplasm of colon: Secondary | ICD-10-CM | POA: Diagnosis not present

## 2018-10-12 DIAGNOSIS — K219 Gastro-esophageal reflux disease without esophagitis: Secondary | ICD-10-CM | POA: Diagnosis not present

## 2018-10-26 ENCOUNTER — Other Ambulatory Visit: Payer: Self-pay

## 2018-10-26 ENCOUNTER — Encounter: Payer: Self-pay | Admitting: Family

## 2018-10-26 ENCOUNTER — Ambulatory Visit (INDEPENDENT_AMBULATORY_CARE_PROVIDER_SITE_OTHER): Payer: BC Managed Care – PPO | Admitting: Family

## 2018-10-26 VITALS — BP 128/91 | HR 78 | Temp 98.8°F | Resp 16 | Ht 60.0 in | Wt 114.0 lb

## 2018-10-26 DIAGNOSIS — Z0000 Encounter for general adult medical examination without abnormal findings: Secondary | ICD-10-CM

## 2018-10-26 DIAGNOSIS — Z23 Encounter for immunization: Secondary | ICD-10-CM

## 2018-10-26 NOTE — Addendum Note (Signed)
Addended by: Jiles Prows on: 10/26/2018 10:49 AM   Modules accepted: Orders

## 2018-10-26 NOTE — Progress Notes (Signed)
Subjective:    Patient ID: Kelsey Henry, female    DOB: [birth year], 53 y.o.   MRN: [id number]  HPI  Patient presents today for complete physical.  Immunizations: Due for Tdap. Diet: healthy Exercise: every day- cardio and strength training Colonoscopy: due (scheduled for 10/22 Dr. Collene Mares) Pap Smear: s/p hysterectomy  Mammogram: 02/08/18 (hx of breast cancer) Vision: up to date Dental: up to date  Wt Readings from Last 3 Encounters:  10/26/18 114 lb (51.7 kg)  09/30/18 116 lb 3.2 oz (52.7 kg)  07/12/18 116 lb (52.6 kg)         Review of Systems  Constitutional: Negative for unexpected weight change.  HENT: Negative for hearing loss and rhinorrhea.   Eyes: Negative for visual disturbance.  Respiratory: Negative for cough and shortness of breath.   Cardiovascular: Negative for chest pain.  Gastrointestinal: Negative for blood in stool, constipation and diarrhea.  Genitourinary: Negative for dysuria, frequency and hematuria.  Musculoskeletal: Negative for arthralgias and myalgias.  Skin: Negative for rash.  Neurological: Negative for headaches.  Hematological: Negative for adenopathy.  Psychiatric/Behavioral:       Denies depression/anxiety       Past Medical History:  Diagnosis Date  . Cancer [facility])    breast  . Genetic testing 02/19/2017   STAT Breast panel with reflext to Multi-Cancer panel (83 genes) @ [facility] - No pathogenic mutations detected  . GERD (gastroesophageal reflux disease)    no meds  . Personal history of chemotherapy    2019  . Personal history of radiation therapy    2019  . Seasonal allergies   . SVD (spontaneous vaginal delivery)    x 1     Social History   Socioeconomic History  . Marital status: Married    Spouse name: Not on file  . Number of children: Not on file  . Years of education: Not on file  . Highest education level: Not on file  Occupational History  . Not on file  Social Needs  . Financial resource strain:  Not on file  . Food insecurity    Worry: Not on file    Inability: Not on file  . Transportation needs    Medical: Not on file    Non-medical: Not on file  Tobacco Use  . Smoking status: Never Smoker  . Smokeless tobacco: Never Used  Substance and Sexual Activity  . Alcohol use: No  . Drug use: No  . Sexual activity: Yes    Partners: Male    Birth control/protection: Surgical  Lifestyle  . Physical activity    Days per week: Not on file    Minutes per session: Not on file  . Stress: Not on file  Relationships  . Social Herbalist on phone: Not on file    Gets together: Not on file    Attends religious service: Not on file    Active member of club or organization: Not on file    Attends meetings of clubs or organizations: Not on file    Relationship status: Not on file  . Intimate partner violence    Fear of current or ex partner: No    Emotionally abused: No    Physically abused: No    Forced sexual activity: No  Other Topics Concern  . 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 ABDOMINAL HYSTERECTOMY    . BILATERAL SALPINGECTOMY Bilateral 06/23/2012   Procedure: BILATERAL SALPINGECTOMY;  Surgeon: Cheri Fowler, MD;  Location: [facility];  Service: Gynecology;  Laterality: Bilateral;  . BREAST LUMPECTOMY Right 03/23/2017  . BREAST LUMPECTOMY WITH RADIOACTIVE SEED AND SENTINEL LYMPH NODE BIOPSY Right 03/23/2017   Procedure: BREAST LUMPECTOMY WITH RADIOACTIVE SEED AND SENTINEL LYMPH NODE BIOPSY;  Surgeon: Rolm Bookbinder, MD;  Location: [place];  Service: General;  Laterality: Right;  . LAPAROSCOPIC SUPRACERVICAL HYSTERECTOMY N/A 06/23/2012   Procedure: LAPAROSCOPIC SUPRACERVICAL  HYSTERECTOMY;  Surgeon: Cheri Fowler, MD;  Location: [facility];  Service: Gynecology;  Laterality: N/A;  . PORT-A-CATH REMOVAL N/A 08/13/2017   Procedure: REMOVAL PORT-A-CATH;  Surgeon: Rolm Bookbinder, MD;  Location: [place];  Service: General;  Laterality: N/A;  . PORTACATH PLACEMENT Right 03/23/2017   Procedure: INSERTION PORT-A-CATH;  Surgeon: Rolm Bookbinder, MD;  Location: [place];  Service: General;  Laterality: Right;  . TUBAL LIGATION    . VULVAR LESION REMOVAL N/A 11/01/2014   Procedure: MONS PUBIS LESION;  Surgeon: Cheri Fowler, MD;  Location: [facility];  Service: Gynecology;  Laterality: N/A;  local anesthesia with IV sedation if needed  . WISDOM TOOTH EXTRACTION      Family History  Problem Relation Age of Onset  . Hypertension Mother   . Diabetes Mellitus II Mother   . Hypertension Sister   . Hypertension Sister   . Breast cancer Neg Hx   . Pancreatic cancer Neg Hx   . Colon cancer Neg Hx     Allergies  Allergen Reactions  . Aspirin Hives    Current Outpatient Medications on File Prior to Visit  Medication Sig Dispense Refill  . loratadine (CLARITIN) 10 MG tablet Take 10 mg by mouth daily as needed for allergies.    . Multiple Vitamins-Minerals (MULTIVITAMIN ADULT) TABS Take by mouth.    Marland Kitchen omeprazole (PRILOSEC) 40 MG capsule Take 1 capsule (40 mg total) by mouth daily. 90 capsule 3  . Probiotic Product (PROBIOTIC-10 PO) Take 1 capsule by mouth daily.    . vitamin C (ASCORBIC ACID) 250 MG tablet Take 250 mg by mouth daily.     No current facility-administered medications on file prior to visit.     LMP 05/26/2012    Objective:   Physical Exam  Physical Exam  Constitutional: She is oriented to person, place, and time. She appears well-developed and well-nourished. No distress.  HENT:  Head: Normocephalic and atraumatic.  Right Ear: Tympanic membrane and ear canal normal.  Left Ear: Tympanic membrane and ear canal normal.  Mouth/Throat:  not examined- pt wearing mask for covid precautions Eyes: Pupils are equal, round, and reactive to light. No scleral icterus.  Neck: Normal range of motion. No thyromegaly present.  Cardiovascular: Normal rate and regular rhythm.  No murmur heard. Pulmonary/Chest: Effort normal and breath sounds normal. No respiratory distress. He has no wheezes. She has no rales. She exhibits no tenderness.  Abdominal: Soft. Bowel sounds are normal. She exhibits no distension and no mass. There is no tenderness. There is no rebound and no guarding.  Musculoskeletal: She exhibits no edema.  Lymphadenopathy:    She has no cervical adenopathy.  Neurological: She is alert and oriented to person, place, and time. She has normal patellar reflexes.  She exhibits normal muscle tone. Coordination normal.  Skin: Skin is warm and dry.  Psychiatric: She has a normal mood and affect. Her behavior is normal. Judgment and thought content normal.  Breast/pelvic: deferred            Assessment & Plan:  Preventative care- encouraged pt to continue healthy diet and regular exercise. Tdap today. Recommended flu shot this fall. Obtain routine lab work. [place] scheduled.        Assessment & Plan:

## 2018-10-26 NOTE — Patient Instructions (Signed)
Continue healthy diet and regular exercise.    Preventive Care 35-51 Years Old, Female Preventive care refers to visits with your health care provider and lifestyle choices that can promote health and wellness. This includes:  A yearly physical exam. This may also be called an annual well check.  Regular dental visits and eye exams.  Immunizations.  Screening for certain conditions.  Healthy lifestyle choices, such as eating a healthy diet, getting regular exercise, not using drugs or products that contain nicotine and tobacco, and limiting alcohol use. What can I expect for my preventive care visit? Physical exam Your health care provider will check your:  Height and weight. This may be used to calculate body mass index (BMI), which tells if you are at a healthy weight.  Heart rate and blood pressure.  Skin for abnormal spots. Counseling Your health care provider may ask you questions about your:  Alcohol, tobacco, and drug use.  Emotional well-being.  Home and relationship well-being.  Sexual activity.  Eating habits.  Work and work Statistician.  Method of birth control.  Menstrual cycle.  Pregnancy history. What immunizations do I need?  Influenza (flu) vaccine  This is recommended every year. Tetanus, diphtheria, and pertussis (Tdap) vaccine  You may need a Td booster every 10 years. Varicella (chickenpox) vaccine  You may need this if you have not been vaccinated. Zoster (shingles) vaccine  You may need this after age 60. Measles, mumps, and rubella (MMR) vaccine  You may need at least one dose of MMR if you were born in 1957 or later. You may also need a second dose. Pneumococcal conjugate (PCV13) vaccine  You may need this if you have certain conditions and were not previously vaccinated. Pneumococcal polysaccharide (PPSV23) vaccine  You may need one or two doses if you smoke cigarettes or if you have certain conditions. Meningococcal  conjugate (MenACWY) vaccine  You may need this if you have certain conditions. Hepatitis A vaccine  You may need this if you have certain conditions or if you travel or work in places where you may be exposed to hepatitis A. Hepatitis B vaccine  You may need this if you have certain conditions or if you travel or work in places where you may be exposed to hepatitis B. Haemophilus influenzae type b (Hib) vaccine  You may need this if you have certain conditions. Human papillomavirus (HPV) vaccine  If recommended by your health care provider, you may need three doses over 6 months. You may receive vaccines as individual doses or as more than one vaccine together in one shot (combination vaccines). Talk with your health care provider about the risks and benefits of combination vaccines. What tests do I need? Blood tests  Lipid and cholesterol levels. These may be checked every 5 years, or more frequently if you are over 25 years old.  Hepatitis C test.  Hepatitis B test. Screening  Lung cancer screening. You may have this screening every year starting at age 23 if you have a 30-pack-year history of smoking and currently smoke or have quit within the past 15 years.  Colorectal cancer screening. All adults should have this screening starting at age 51 and continuing until age 59. Your health care provider may recommend screening at age 75 if you are at increased risk. You will have tests every 1-10 years, depending on your results and the type of screening test.  Diabetes screening. This is done by checking your blood sugar (glucose) after you have not  eaten for a while (fasting). You may have this done every 1-3 years.  Mammogram. This may be done every 1-2 years. Talk with your health care provider about when you should start having regular mammograms. This may depend on whether you have a family history of breast cancer.  BRCA-related cancer screening. This may be done if you have a  family history of breast, ovarian, tubal, or peritoneal cancers.  Pelvic exam and Pap test. This may be done every 3 years starting at age 85. Starting at age 19, this may be done every 5 years if you have a Pap test in combination with an HPV test. Other tests  Sexually transmitted disease (STD) testing.  Bone density scan. This is done to screen for osteoporosis. You may have this scan if you are at high risk for osteoporosis. Follow these instructions at home: Eating and drinking  Eat a diet that includes fresh fruits and vegetables, whole grains, lean protein, and low-fat dairy.  Take vitamin and mineral supplements as recommended by your health care provider.  Do not drink alcohol if: ? Your health care provider tells you not to drink. ? You are pregnant, may be pregnant, or are planning to become pregnant.  If you drink alcohol: ? Limit how much you have to 0-1 drink a day. ? Be aware of how much alcohol is in your drink. In the [place] one drink equals one 12 oz bottle of beer (355 mL), one 5 oz glass of wine (148 mL), or one 1 oz glass of hard liquor (44 mL). Lifestyle  Take daily care of your teeth and gums.  Stay active. Exercise for at least 30 minutes on 5 or more days each week.  Do not use any products that contain nicotine or tobacco, such as cigarettes, e-cigarettes, and chewing tobacco. If you need help quitting, ask your health care provider.  If you are sexually active, practice safe sex. Use a condom or other form of birth control (contraception) in order to prevent pregnancy and STIs (sexually transmitted infections).  If told by your health care provider, take low-dose aspirin daily starting at age 38. What's next?  Visit your health care provider once a year for a well check visit.  Ask your health care provider how often you should have your eyes and teeth checked.  Stay up to date on all vaccines. This information is not intended to replace advice given  to you by your health care provider. Make sure you discuss any questions you have with your health care provider. Document Released: 04/27/2015 Document Revised: 12/10/2017 Document Reviewed: 12/10/2017 Elsevier Patient Education  2020 [employer].

## 2018-10-27 ENCOUNTER — Telehealth: Payer: Self-pay | Admitting: Family

## 2018-10-27 DIAGNOSIS — R748 Abnormal levels of other serum enzymes: Secondary | ICD-10-CM

## 2018-10-27 DIAGNOSIS — R779 Abnormality of plasma protein, unspecified: Secondary | ICD-10-CM

## 2018-10-27 LAB — CBC WITH DIFFERENTIAL/PLATELET
Basophils Absolute: 0 10*3/uL (ref 0.0–0.2)
Basos: 1 %
EOS (ABSOLUTE): 0.1 10*3/uL (ref 0.0–0.4)
Eos: 2 %
Hematocrit: 44.4 % (ref 34.0–46.6)
Hemoglobin: 14.5 g/dL (ref 11.1–15.9)
Immature Grans (Abs): 0 10*3/uL (ref 0.0–0.1)
Immature Granulocytes: 0 %
Lymphocytes Absolute: 1.9 10*3/uL (ref 0.7–3.1)
Lymphs: 28 %
MCH: 29.6 pg (ref 26.6–33.0)
MCHC: 32.7 g/dL (ref 31.5–35.7)
MCV: 91 fL (ref 79–97)
Monocytes Absolute: 0.5 10*3/uL (ref 0.1–0.9)
Monocytes: 8 %
Neutrophils Absolute: 4.1 10*3/uL (ref 1.4–7.0)
Neutrophils: 61 %
Platelets: 251 10*3/uL (ref 150–450)
RBC: 4.9 x10E6/uL (ref 3.77–5.28)
RDW: 12.2 % (ref 11.7–15.4)
WBC: 6.7 10*3/uL (ref 3.4–10.8)

## 2018-10-27 LAB — BASIC METABOLIC PANEL
BUN/Creatinine Ratio: 16 (ref 9–23)
BUN: 14 mg/dL (ref 6–24)
CO2: 25 mmol/L (ref 20–29)
Calcium: 10 mg/dL (ref 8.7–10.2)
Chloride: 100 mmol/L (ref 96–106)
Creatinine, Ser: 0.89 mg/dL (ref 0.57–1.00)
GFR calc Af Amer: 87 mL/min/{1.73_m2} (ref >59)
GFR calc non Af Amer: 76 mL/min/{1.73_m2} (ref >59)
Glucose: 82 mg/dL (ref 65–99)
Potassium: 4.6 mmol/L (ref 3.5–5.2)
Sodium: 139 mmol/L (ref 134–144)

## 2018-10-27 LAB — LIPID PANEL
Chol/HDL Ratio: 2.5 ratio (ref 0.0–4.4)
Cholesterol, Total: 239 mg/dL — ABNORMAL HIGH (ref 100–199)
HDL: 94 mg/dL (ref >39)
LDL Calculated: 133 mg/dL — ABNORMAL HIGH (ref 0–99)
Triglycerides: 58 mg/dL (ref 0–149)
VLDL Cholesterol Cal: 12 mg/dL (ref 5–40)

## 2018-10-27 LAB — HEPATIC FUNCTION PANEL
ALT: 34 IU/L — ABNORMAL HIGH (ref 0–32)
AST: 32 IU/L (ref 0–40)
Albumin: 4.9 g/dL — ABNORMAL HIGH (ref 3.8–4.8)
Alkaline Phosphatase: 125 IU/L — ABNORMAL HIGH (ref 39–117)
Bilirubin Total: 0.4 mg/dL (ref 0.0–1.2)
Bilirubin, Direct: 0.13 mg/dL (ref 0.00–0.40)
Total Protein: 7.6 g/dL (ref 6.0–8.5)

## 2018-10-27 LAB — TSH: TSH: 2.2 u[IU]/mL (ref 0.450–4.500)

## 2018-10-27 NOTE — Telephone Encounter (Signed)
Please let pt know that one of the blood proteins is slightly elevated. I would like to repeat this test and some additional testing in 2 weeks. Future orders have been placed.

## 2018-10-27 NOTE — Telephone Encounter (Signed)
Results given to patient and scheduled for labs in 2 weeks.  Patient will like to get a call back from provider, has question about abnormal labs.

## 2018-10-28 NOTE — Telephone Encounter (Signed)
Spoke with patient , questions answered

## 2018-11-08 DIAGNOSIS — Z1211 Encounter for screening for malignant neoplasm of colon: Secondary | ICD-10-CM | POA: Diagnosis not present

## 2018-11-08 LAB — HM COLONOSCOPY

## 2018-11-10 ENCOUNTER — Other Ambulatory Visit (INDEPENDENT_AMBULATORY_CARE_PROVIDER_SITE_OTHER): Payer: BC Managed Care – PPO

## 2018-11-10 ENCOUNTER — Other Ambulatory Visit: Payer: Self-pay

## 2018-11-10 DIAGNOSIS — R779 Abnormality of plasma protein, unspecified: Secondary | ICD-10-CM

## 2018-11-10 LAB — HEPATIC FUNCTION PANEL
ALT: 38 U/L — ABNORMAL HIGH (ref 0–35)
AST: 36 U/L (ref 0–37)
Albumin: 4.6 g/dL (ref 3.5–5.2)
Alkaline Phosphatase: 115 U/L (ref 39–117)
Bilirubin, Direct: 0.1 mg/dL (ref 0.0–0.3)
Total Bilirubin: 0.6 mg/dL (ref 0.2–1.2)
Total Protein: 7.2 g/dL (ref 6.0–8.3)

## 2018-11-12 ENCOUNTER — Telehealth: Payer: Self-pay

## 2018-11-12 NOTE — Telephone Encounter (Signed)
Attempted to reach patient. No answer, unable to leave voice mail.

## 2018-11-12 NOTE — Telephone Encounter (Signed)
Reviewed labs with pt.  She denies any alcohol intake. Will monitor LFT.

## 2018-11-12 NOTE — Telephone Encounter (Signed)
Patient called in stating she has a few questions for PCP she would like to address. Please advise and call back.

## 2018-11-12 NOTE — Telephone Encounter (Signed)
Please advise 

## 2018-11-15 LAB — ALKALINE PHOSPHATASE ISOENZYMES
Alkaline phosphatase (APISO): 116 U/L (ref 37–153)
Bone Isoenzymes: 65 % (ref 28–66)
Intestinal Isoenzymes: 4 % (ref 1–24)
Liver Isoenzymes: 31 % (ref 25–69)

## 2018-11-25 ENCOUNTER — Other Ambulatory Visit: Payer: Self-pay | Admitting: Oncology

## 2018-11-25 DIAGNOSIS — Z853 Personal history of malignant neoplasm of breast: Secondary | ICD-10-CM

## 2019-01-23 DIAGNOSIS — S161XXA Strain of muscle, fascia and tendon at neck level, initial encounter: Secondary | ICD-10-CM | POA: Diagnosis not present

## 2019-01-23 DIAGNOSIS — S29019A Strain of muscle and tendon of unspecified wall of thorax, initial encounter: Secondary | ICD-10-CM | POA: Diagnosis not present

## 2019-01-23 DIAGNOSIS — M62838 Other muscle spasm: Secondary | ICD-10-CM | POA: Diagnosis not present

## 2019-01-31 DIAGNOSIS — Z130 Encounter for screening for diseases of the blood and blood-forming organs and certain disorders involving the immune mechanism: Secondary | ICD-10-CM | POA: Diagnosis not present

## 2019-01-31 DIAGNOSIS — Z6823 Body mass index (BMI) 23.0-23.9, adult: Secondary | ICD-10-CM | POA: Diagnosis not present

## 2019-01-31 DIAGNOSIS — Z1389 Encounter for screening for other disorder: Secondary | ICD-10-CM | POA: Diagnosis not present

## 2019-01-31 DIAGNOSIS — Z01419 Encounter for gynecological examination (general) (routine) without abnormal findings: Secondary | ICD-10-CM | POA: Diagnosis not present

## 2019-02-10 ENCOUNTER — Ambulatory Visit
Admission: RE | Admit: 2019-02-10 | Discharge: 2019-02-10 | Disposition: A | Discharge status: Home / Self Care | Payer: BC Managed Care – PPO | Source: Ambulatory Visit | Attending: Oncology | Admitting: Oncology

## 2019-02-10 ENCOUNTER — Other Ambulatory Visit: Payer: Self-pay

## 2019-02-10 DIAGNOSIS — R922 Inconclusive mammogram: Secondary | ICD-10-CM | POA: Diagnosis not present

## 2019-02-10 DIAGNOSIS — Z853 Personal history of malignant neoplasm of breast: Secondary | ICD-10-CM

## 2019-04-11 DIAGNOSIS — Z20828 Contact with and (suspected) exposure to other viral communicable diseases: Secondary | ICD-10-CM | POA: Diagnosis not present

## 2019-04-11 DIAGNOSIS — J019 Acute sinusitis, unspecified: Secondary | ICD-10-CM | POA: Diagnosis not present

## 2019-06-14 DIAGNOSIS — C50511 Malignant neoplasm of lower-outer quadrant of right female breast: Secondary | ICD-10-CM | POA: Diagnosis not present

## 2019-06-20 ENCOUNTER — Ambulatory Visit: Payer: BC Managed Care – PPO | Attending: Internal Medicine

## 2019-06-20 DIAGNOSIS — U071 COVID-19: Secondary | ICD-10-CM | POA: Diagnosis not present

## 2019-06-20 DIAGNOSIS — Z23 Encounter for immunization: Secondary | ICD-10-CM

## 2019-06-20 DIAGNOSIS — Z20822 Contact with and (suspected) exposure to covid-19: Secondary | ICD-10-CM | POA: Diagnosis not present

## 2019-06-20 NOTE — Progress Notes (Signed)
   Covid-19 Vaccination Clinic  Name:  Kelsey Henry    MRN: [id number] DOB: 10-11-1965  06/20/2019  Kelsey Henry was observed post Covid-19 immunization for 15 minutes without incident. She was provided with Vaccine Information Sheet and instruction to access the V-Safe system.   Kelsey Henry was instructed to call 911 with any severe reactions post vaccine: Marland Kitchen Difficulty breathing  . Swelling of face and throat  . A fast heartbeat  . A bad rash all over body  . Dizziness and weakness   Immunizations Administered    Name Date Dose VIS Date Route   Pfizer COVID-19 Vaccine 06/20/2019  9:47 AM 0.3 mL 03/25/2019 Intramuscular   Manufacturer: [place]   Lot: [id number]   [place]: [id number]

## 2019-06-28 DIAGNOSIS — Z20822 Contact with and (suspected) exposure to covid-19: Secondary | ICD-10-CM | POA: Diagnosis not present

## 2019-06-28 DIAGNOSIS — U071 COVID-19: Secondary | ICD-10-CM | POA: Diagnosis not present

## 2019-07-07 DIAGNOSIS — U071 COVID-19: Secondary | ICD-10-CM | POA: Diagnosis not present

## 2019-07-07 DIAGNOSIS — Z03818 Encounter for observation for suspected exposure to other biological agents ruled out: Secondary | ICD-10-CM | POA: Diagnosis not present

## 2019-07-12 ENCOUNTER — Ambulatory Visit: Payer: BC Managed Care – PPO

## 2019-08-03 ENCOUNTER — Ambulatory Visit: Payer: BC Managed Care – PPO | Attending: Internal Medicine

## 2019-08-03 DIAGNOSIS — Z23 Encounter for immunization: Secondary | ICD-10-CM

## 2019-08-03 NOTE — Progress Notes (Signed)
   Covid-19 Vaccination Clinic  Name:  Kelsey Henry    MRN: [id number] DOB: 01-05-1966  08/03/2019  Kelsey Henry was observed post Covid-19 immunization for 15 minutes without incident. She was provided with Vaccine Information Sheet and instruction to access the V-Safe system.   Kelsey Henry was instructed to call 911 with any severe reactions post vaccine: Marland Kitchen Difficulty breathing  . Swelling of face and throat  . A fast heartbeat  . A bad rash all over body  . Dizziness and weakness   Immunizations Administered    Name Date Dose VIS Date Route   Pfizer COVID-19 Vaccine 08/03/2019  8:16 AM 0.3 mL 06/08/2018 Intramuscular   Manufacturer: [place]   Lot: [id number]   [place]: [id number]

## 2019-08-15 DIAGNOSIS — L292 Pruritus vulvae: Secondary | ICD-10-CM | POA: Diagnosis not present

## 2019-08-15 DIAGNOSIS — N898 Other specified noninflammatory disorders of vagina: Secondary | ICD-10-CM | POA: Diagnosis not present

## 2019-08-15 DIAGNOSIS — Z113 Encounter for screening for infections with a predominantly sexual mode of transmission: Secondary | ICD-10-CM | POA: Diagnosis not present

## 2019-09-03 IMAGING — MG MM CLIP PLACEMENT
2 series · 2 of 2 positions shown · non-contrast
Comparison: Previous exam(s).

CLINICAL DATA: Evaluate biopsy clips

EXAM:
DIAGNOSTIC RIGHT MAMMOGRAM POST ULTRASOUND BIOPSY

[R ML]
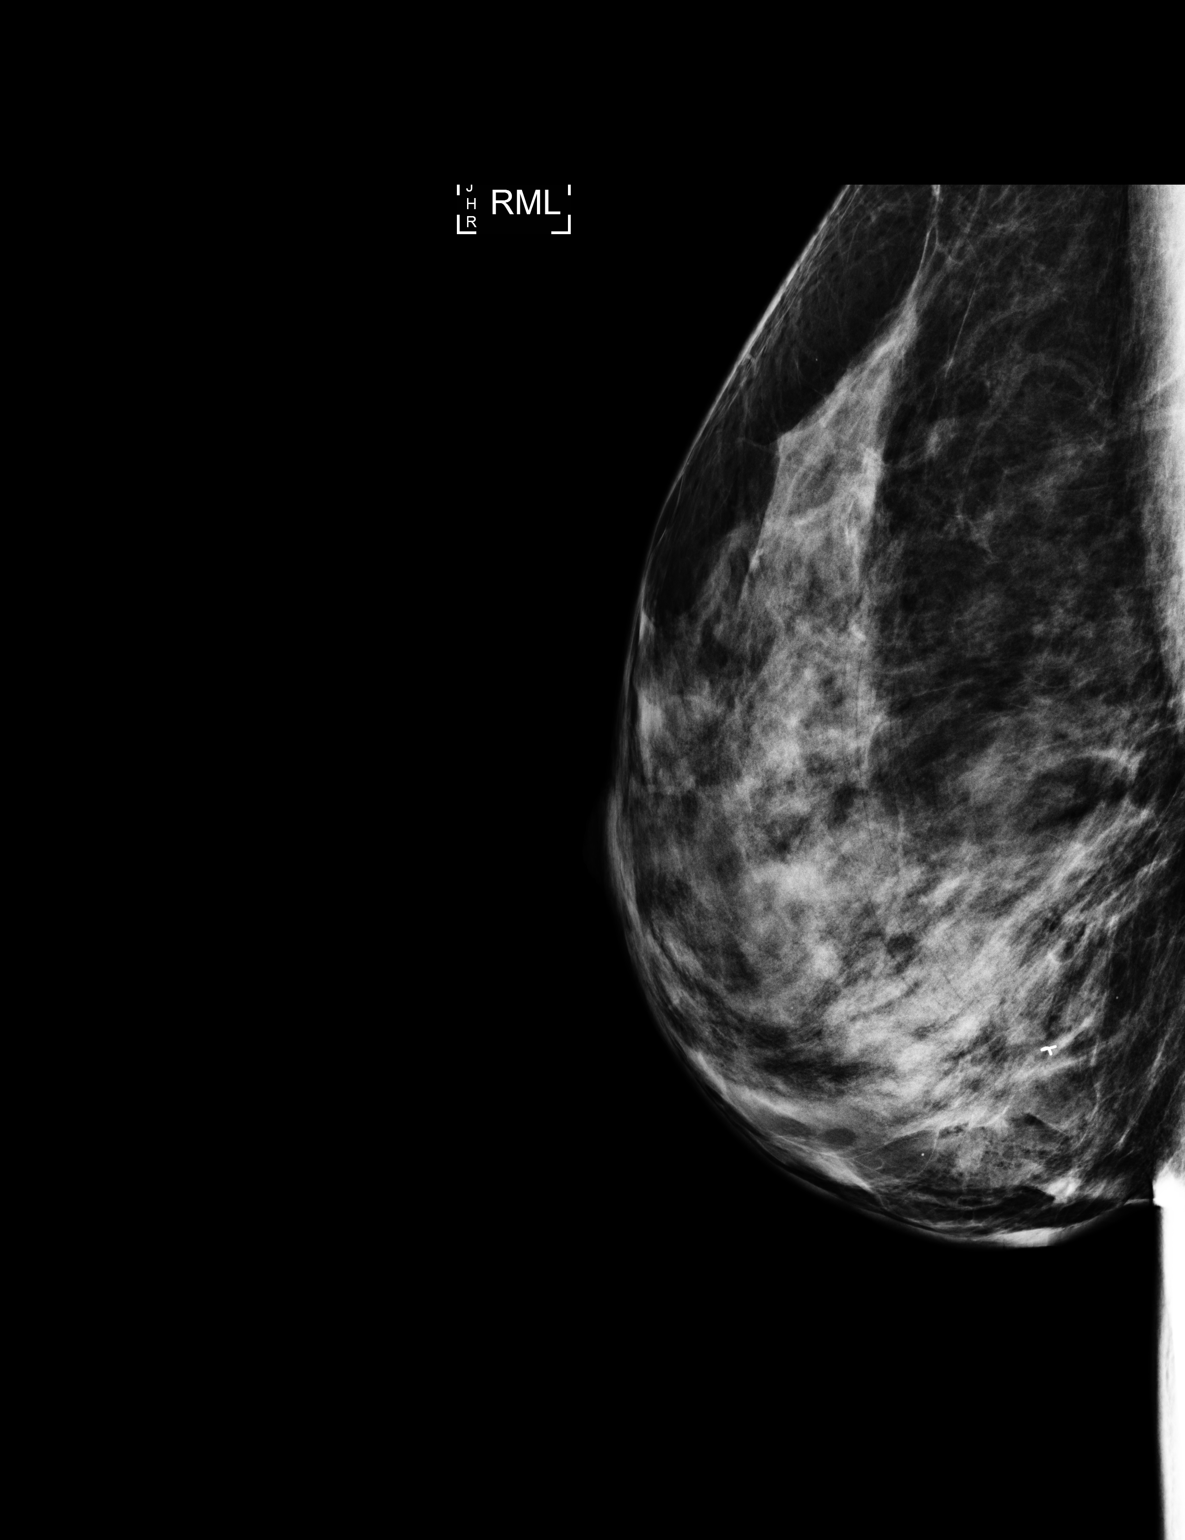

[R CC]
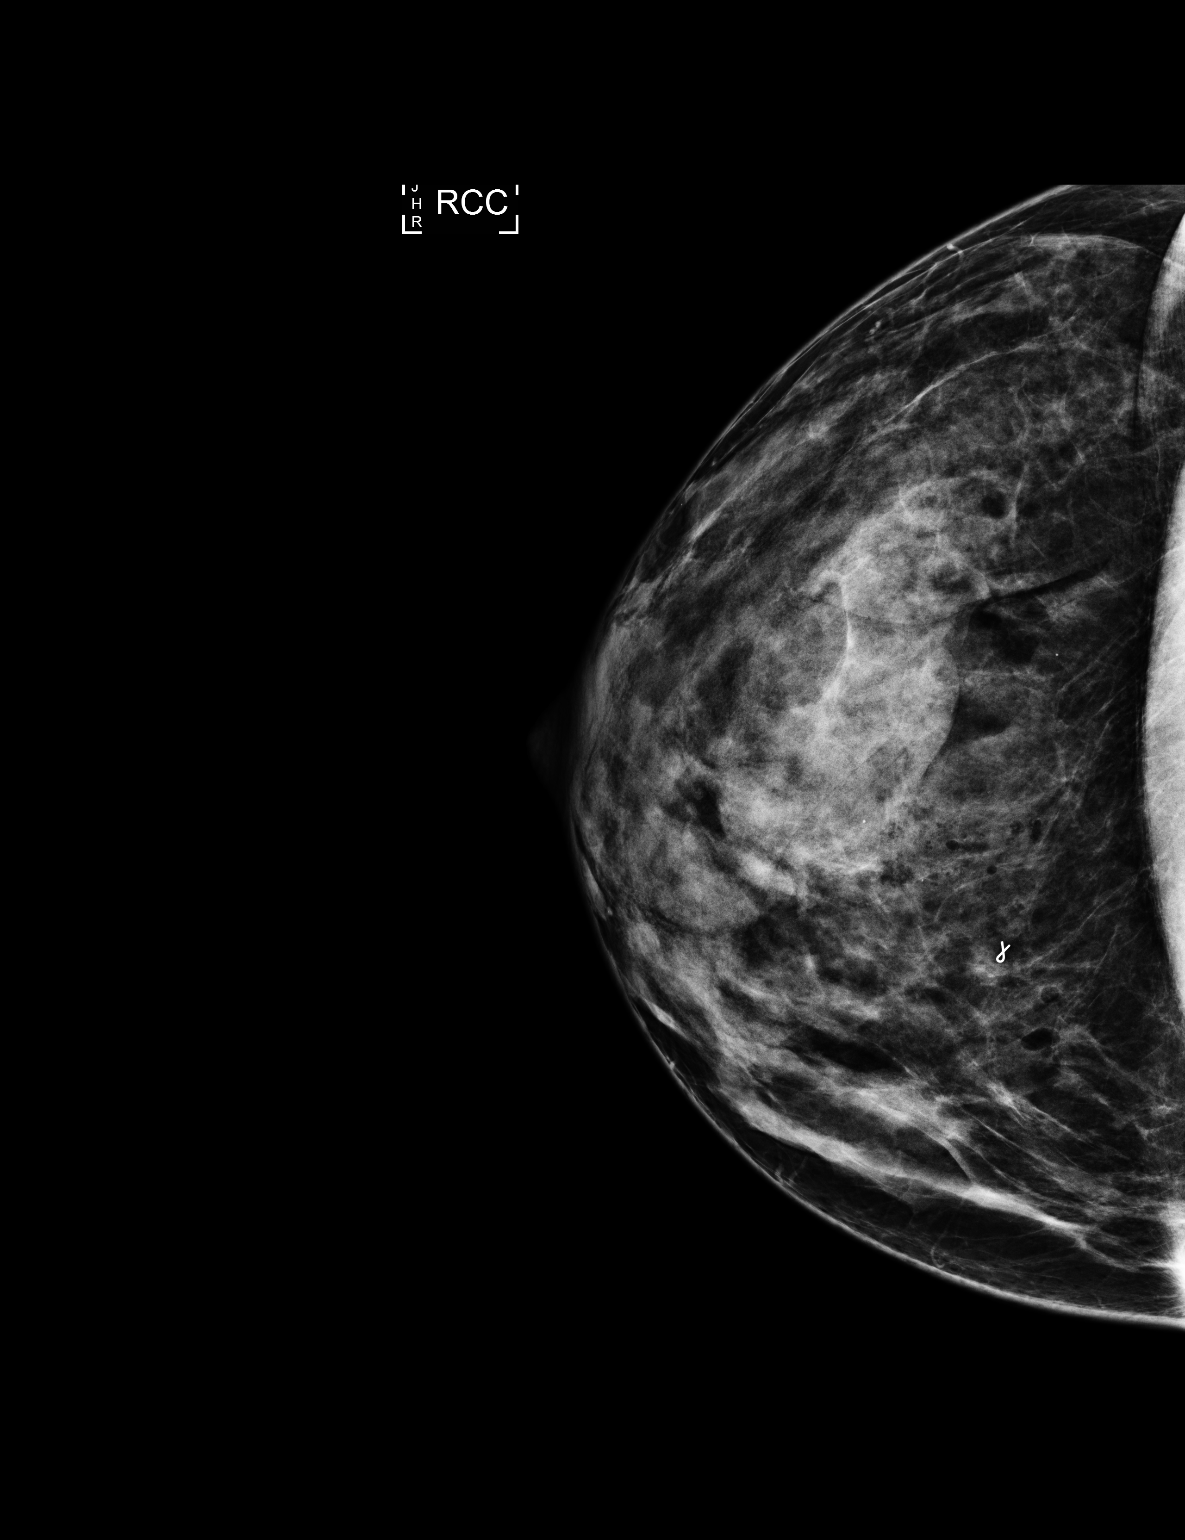

[2 of 2 positions shown; findings below may reference images not displayed]

FINDINGS: Mammographic images were obtained following ultrasound guided biopsy
of a right breast mass. The ribbon shaped biopsy clip is in good
position.
IMPRESSION: The ribbon shaped biopsy clip is in good position.

Final Assessment: Post Procedure Mammograms for Marker Placement

## 2019-09-03 IMAGING — US US BREAST BX W LOC DEV 1ST LESION IMG BX SPEC US GUIDE*R*
1 series · 13 of 13 positions shown · non-contrast
Comparison: Previous exam(s).

ADDENDUM:
Pathology revealed GRADE III INVASIVE MAMMARY CARCINOMA of the Right
breast, [DATE]. This was found to be concordant by Dr. Reince Toledana.
Pathology results were discussed with the patient by telephone. The
patient reported doing well after the biopsy with tenderness at the
site. Post biopsy instructions and care were reviewed and questions
were answered. The patient was encouraged to call The [REDACTED] for any additional concerns. The patient was
referred to [REDACTED] [REDACTED] at
[REDACTED] on February 18, 2017.

Pathology results reported by Cam Serra, RN on 02/11/2017.
CLINICAL DATA: Right breast mass
EXAM:
ULTRASOUND GUIDED RIGHT BREAST CORE NEEDLE BIOPSY

[Series 1: us breast bx w loc dev 1st lesion img bx spec us g · 0.07mm/px · 13 of 13 slices shown]
[im 1/13]
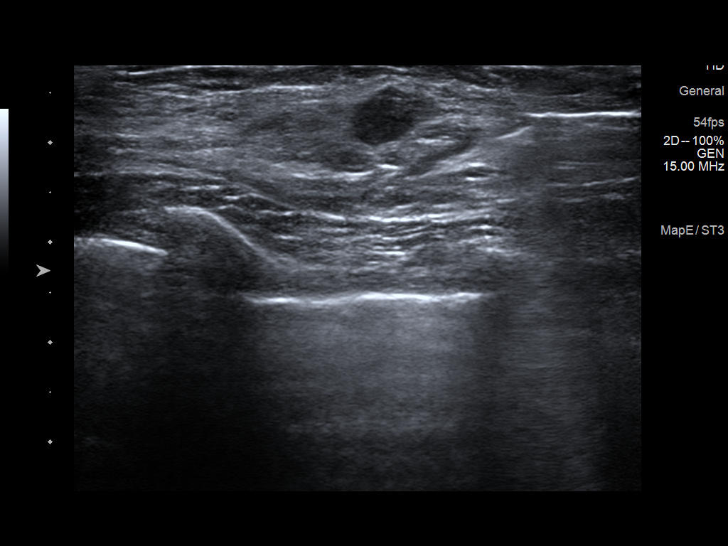
[im 2/13]
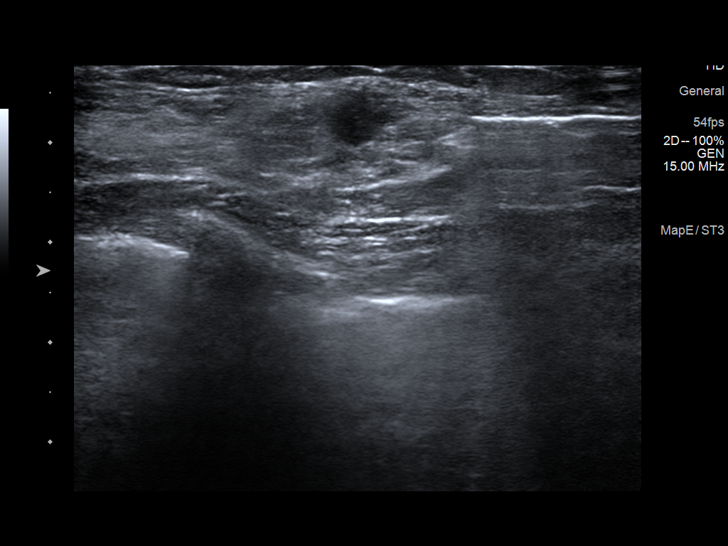
[im 3/13]
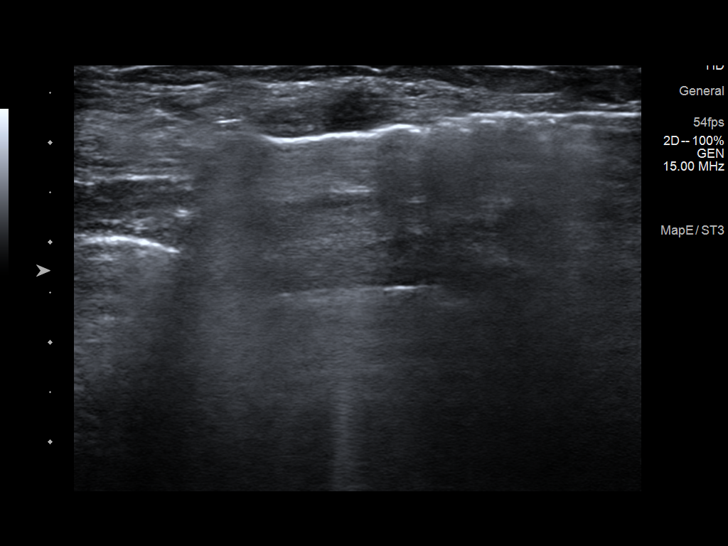
[im 4/13]
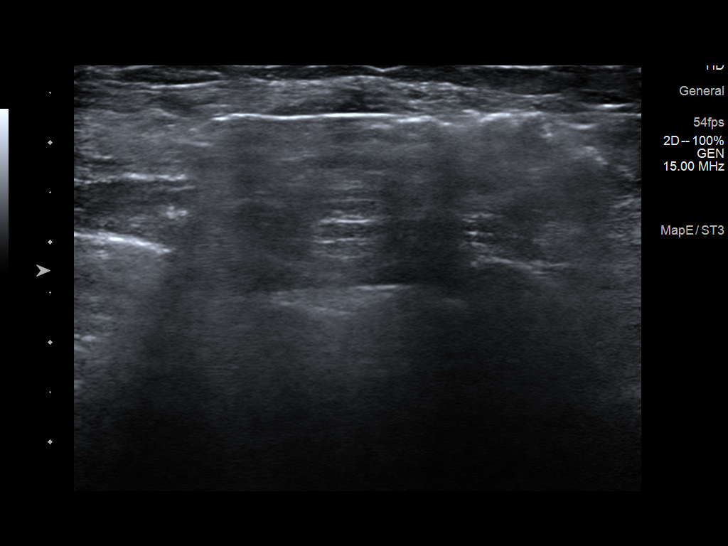
[im 5/13]
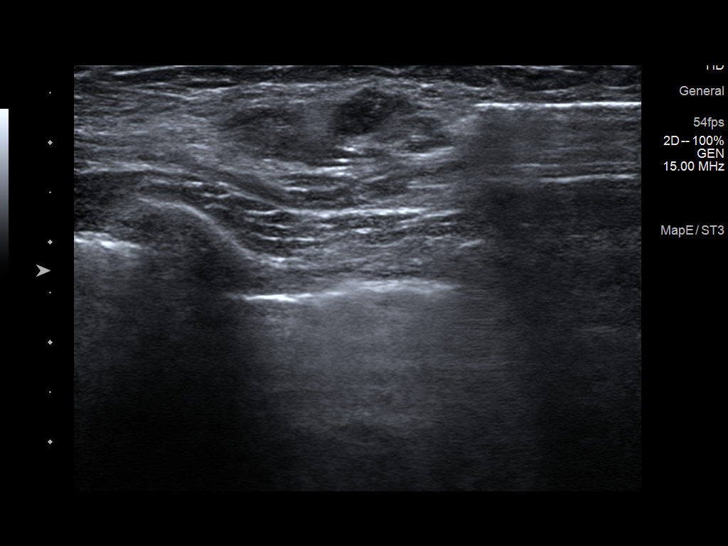
[im 6/13]
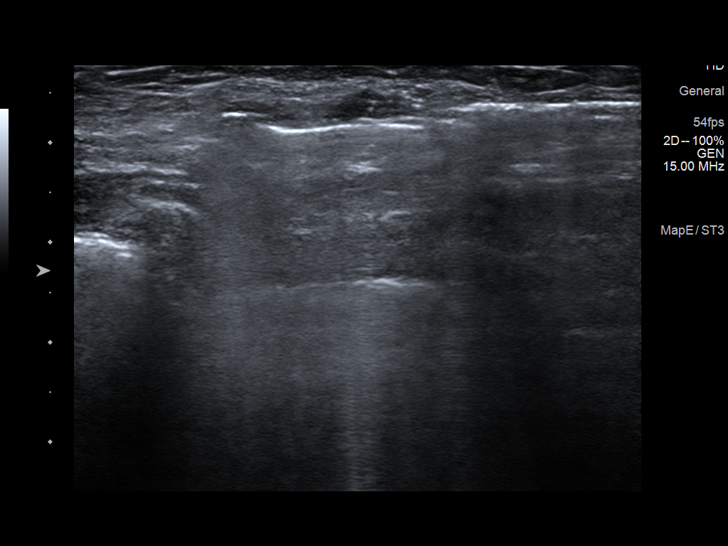
[im 7/13]
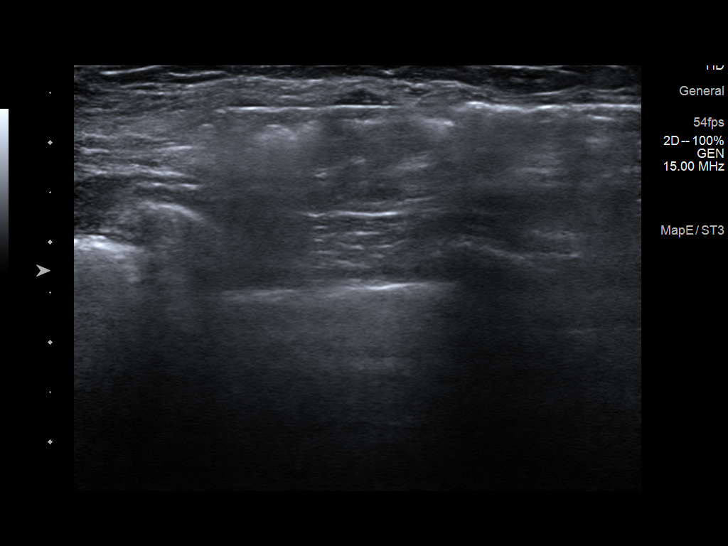
[im 8/13]
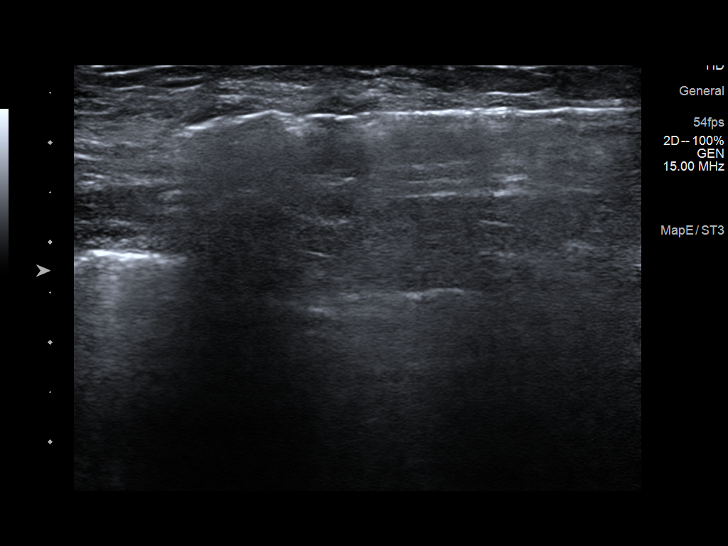
[im 9/13]
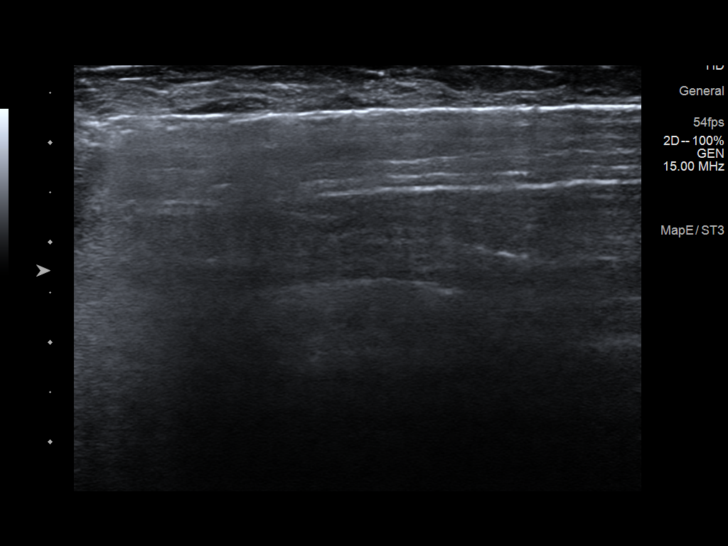
[im 10/13]
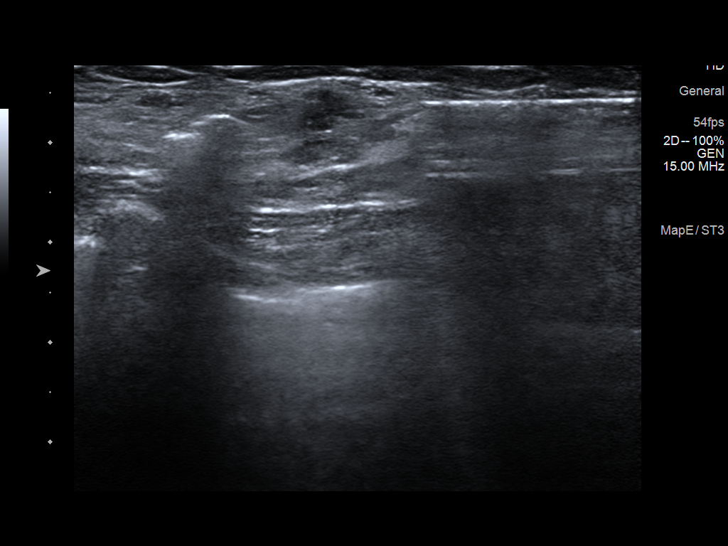
[im 11/13]
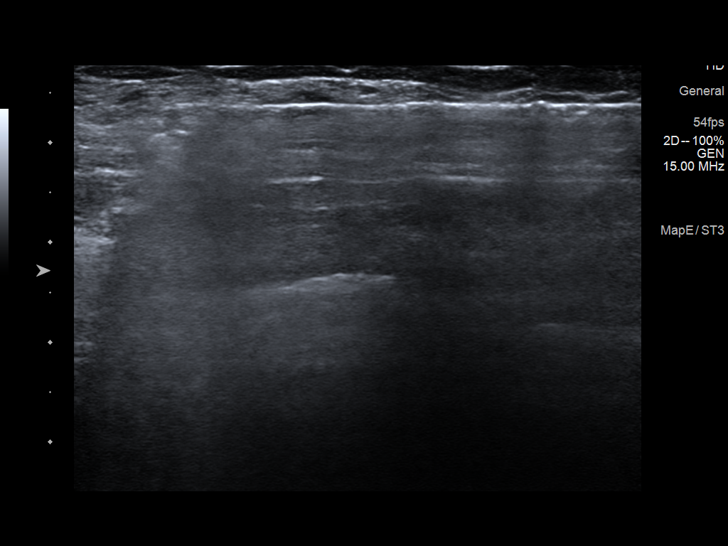
[im 12/13]
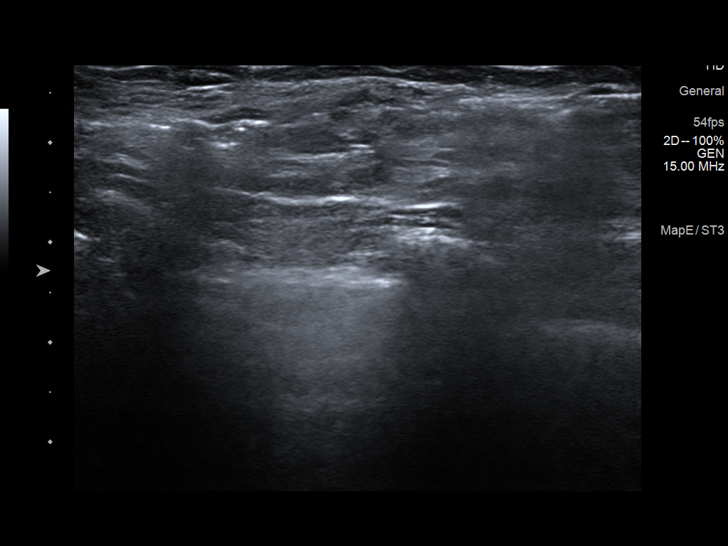
[im 13/13]
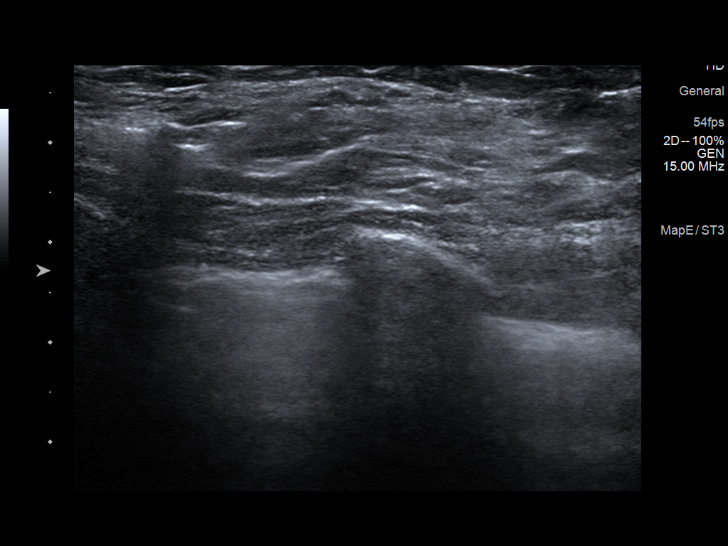

[13 of 13 positions shown; findings below may reference images not displayed]



Lesion quadrant: Lower-inner

Using sterile technique and 1% Lidocaine as local anesthetic, under
direct ultrasound visualization, a 12 gauge Kim John device was
used to perform biopsy of a right breast mass using a lateral
approach. At the conclusion of the procedure a ribbon shape tissue
marker clip was deployed into the biopsy cavity. Follow up 2 view
mammogram was performed and dictated separately.
IMPRESSION: Ultrasound guided biopsy of a right breast mass. No apparent
complications.

## 2019-10-02 NOTE — [facility] Notes (Signed)
[place]  [phone] [phone]     ID: Kelsey Henry DOB: 09-Aug-1965  MR#: [id number]  MOL#:[id number]  Patient Care Team: Kelsey Alar, NP as PCP - General (Internal Medicine) Kelsey Henry, Kelsey Dad, MD as Consulting Physician (Oncology) Kelsey Rudd, MD as Consulting Physician (Radiation Oncology) Kelsey Bookbinder, MD as Consulting Physician (General Surgery) Kelsey Henry, Kelsey Mocha, MD as Consulting Physician (Obstetrics and Gynecology) Kelsey Specter, MD (Unknown Physician Specialty) Kelsey Henry, Kelsey Massed, NP as Nurse Practitioner (Hematology and Oncology) OTHER MD:  CHIEF COMPLAINT: triple negative breast cancer  CURRENT TREATMENT: observation   INTERVAL HISTORY: Kelsey Henry returns today for follow-up of her triple negative breast cancer. She continues on observation.  Since her last visit, she underwent bilateral diagnostic mammography with tomography at [facility] on 02/10/2019 showing: breast density category D; no evidence of malignancy in either breast.    REVIEW OF SYSTEMS: Kelsey Henry exercises about 6 days/week, usually at the gym, at least a couple of those days she does weights.  She had the Pfizer vaccine with good tolerance.  Work is fine and family is fine except that she still does not have any grandchildren.  A detailed review of systems today was otherwise stable   HISTORY OF CURRENT ILLNESS: From the original intake note:  Kelsey Henry had routine screening mammography October 2018 showing a possible right breast mass.  She was scheduled for unilateral right mammography and tomography with right breast ultrasonography at the breast center February 06, 2017.  This showed the breast density to be category D.  In the lower inner quadrant of the right breast there was a 0.6 cm oval mass, which by ultrasound measured 0.9 cm and was irregular and hypoechoic.  Ultrasound of the right axilla was sonographically benign.  On  February 09, 2017 she underwent biopsy of the right breast mass in question, and this showed ([facility] [phone]) invasive [ductal] carcinoma, grade 3, estrogen and progesterone receptor negative, HER-2/neu not amplified, with a sickness ratio of 1.23 and the number per cell 2.9.  The patient's subsequent history is as detailed below.   PAST MEDICAL HISTORY: Past Medical History:  Diagnosis Date  . Cancer [facility])    breast  . Genetic testing 02/19/2017   STAT Breast panel with reflext to Multi-Cancer panel (83 genes) @ [facility] - No pathogenic mutations detected  . GERD (gastroesophageal reflux disease)    no meds  . Personal history of chemotherapy    2019  . Personal history of radiation therapy    2019  . Seasonal allergies   . SVD (spontaneous vaginal delivery)    x 1    PAST SURGICAL HISTORY: Past Surgical History:  Procedure Laterality Date  . ABDOMINAL HYSTERECTOMY    . BILATERAL SALPINGECTOMY Bilateral 06/23/2012   Procedure: BILATERAL SALPINGECTOMY;  Surgeon: Kelsey Fowler, MD;  Location: [facility];  Service: Gynecology;  Laterality: Bilateral;  . BREAST LUMPECTOMY Right 03/23/2017  . BREAST LUMPECTOMY WITH RADIOACTIVE SEED AND SENTINEL LYMPH NODE BIOPSY Right 03/23/2017   Procedure: BREAST LUMPECTOMY WITH RADIOACTIVE SEED AND SENTINEL LYMPH NODE BIOPSY;  Surgeon: Kelsey Bookbinder, MD;  Location: [facility];  Service: General;  Laterality: Right;  . LAPAROSCOPIC SUPRACERVICAL HYSTERECTOMY N/A 06/23/2012   Procedure: LAPAROSCOPIC SUPRACERVICAL HYSTERECTOMY;  Surgeon: Kelsey Fowler, MD;  Location: [facility];  Service: Gynecology;  Laterality: N/A;  . PORT-A-CATH REMOVAL N/A 08/13/2017   Procedure: REMOVAL PORT-A-CATH;  Surgeon: Kelsey Bookbinder, MD;  Location: [facility];  Service: General;  Laterality: N/A;  . PORTACATH  PLACEMENT Right 03/23/2017   Procedure: INSERTION PORT-A-CATH;  Surgeon: Kelsey Bookbinder, MD;  Location: [facility];  Service: General;  Laterality:  Right;  . TUBAL LIGATION    . VULVAR LESION REMOVAL N/A 11/01/2014   Procedure: MONS PUBIS LESION;  Surgeon: Kelsey Fowler, MD;  Location: [facility];  Service: Gynecology;  Laterality: N/A;  local anesthesia with IV sedation if needed  . WISDOM TOOTH EXTRACTION      FAMILY HISTORY Family History  Problem Relation Age of Onset  . Hypertension Mother   . Diabetes Mellitus II Mother   . Hypertension Sister   . Hypertension Sister   . Breast cancer Neg Hx   . Pancreatic cancer Neg Hx   . Colon cancer Neg Hx   The patient has no information regarding her father or his side of the family.  The patient's mother is 55 years old as of November 2018.  The patient has 3 brothers, 2 sisters.  One sister had 2 "benign tumors" removed from the right breast at the age of 38.  A maternal grandfather had prostate cancer in his 32s.   GYNECOLOGIC HISTORY:  Patient's last menstrual period was 05/26/2012. Menarche age 7, first live birth age 9, the patient is GX P1.  She stopped having periods 2012 when she underwent hysterectomy, without salpingo-oophorectomy..  She used hormone replacement for approximately 3 months.  She is used oral contraceptives remotely with no complications.   SOCIAL HISTORY:  Kelsey Henry works as an Psychologist, educational for [employer], in the paternal DNA subsection.  Her husband Kelsey Henry is an Clinical biochemist. Their son Kelsey Henry is an Producer, television/film/video and lives in [place].  The patient has no grandchildren.  She is a Psychologist, forensic.    ADVANCED DIRECTIVES: Not in place   HEALTH MAINTENANCE: Social History   Tobacco Use  . Smoking status: Never Smoker  . Smokeless tobacco: Never Used  Substance Use Topics  . Alcohol use: No  . Drug use: No     Colonoscopy: n/a  PAP: Status post hysterectomy  Bone density: n/a   Allergies  Allergen Reactions  . Aspirin Hives    Current Outpatient Medications  Medication Sig Dispense Refill  . loratadine (CLARITIN) 10 MG tablet Take 10 mg by mouth daily as  needed for allergies.    . Multiple Vitamins-Minerals (MULTIVITAMIN ADULT) TABS Take by mouth.    Marland Kitchen omeprazole (PRILOSEC) 40 MG capsule Take 1 capsule (40 mg total) by mouth daily. 90 capsule 3  . Probiotic Product (PROBIOTIC-10 PO) Take 1 capsule by mouth daily.    . vitamin C (ASCORBIC ACID) 250 MG tablet Take 250 mg by mouth daily.     No current facility-administered medications for this visit.    OBJECTIVE:  African-American woman who appears younger than stated age  10:   10/03/19 0931  BP: 129/86  Pulse: 72  Resp: 17  Temp: 97.8 F (36.6 C)  SpO2: 100%     Body mass index is 22.83 kg/m.   Wt Readings from Last 3 Encounters:  10/03/19 116 lb 14.4 oz (53 kg)  10/26/18 114 lb (51.7 kg)  09/30/18 116 lb 3.2 oz (52.7 kg)      ECOG FS:0 - Asymptomatic  Sclerae unicteric, EOMs intact Wearing a mask No cervical or supraclavicular adenopathy Lungs no rales or rhonchi Heart regular rate and rhythm Abd soft, nontender, positive bowel sounds MSK no focal spinal tenderness, no upper extremity lymphedema Neuro: nonfocal, well oriented, appropriate affect Breasts: The right breast is status  post lumpectomy and radiation.  The cosmetic result is excellent.  The left breast is benign.  Both axillae are benign.   LAB RESULTS:  CMP     Component Value Date/Time   NA 139 10/26/2018 0955   NA 142 02/18/2017 1221   K 4.6 10/26/2018 0955   K 4.6 02/18/2017 1221   CL 100 10/26/2018 0955   CO2 25 10/26/2018 0955   CO2 27 02/18/2017 1221   GLUCOSE 82 10/26/2018 0955   GLUCOSE 91 03/01/2018 0818   GLUCOSE 95 02/18/2017 1221   BUN 14 10/26/2018 0955   BUN 18.4 02/18/2017 1221   CREATININE 0.89 10/26/2018 0955   CREATININE 1.05 (H) 03/01/2018 0818   CREATININE 1.0 02/18/2017 1221   CALCIUM 10.0 10/26/2018 0955   CALCIUM 10.3 02/18/2017 1221   PROT 7.2 11/10/2018 0834   PROT 7.6 10/26/2018 0955   PROT 8.3 02/18/2017 1221   ALBUMIN 4.6 11/10/2018 0834   ALBUMIN 4.9 (H)  10/26/2018 0955   ALBUMIN 4.4 02/18/2017 1221   AST 36 11/10/2018 0834   AST 38 03/01/2018 0818   AST 22 02/18/2017 1221   ALT 38 (H) 11/10/2018 0834   ALT 34 03/01/2018 0818   ALT 18 02/18/2017 1221   ALKPHOS 115 11/10/2018 0834   ALKPHOS 89 02/18/2017 1221   BILITOT 0.6 11/10/2018 0834   BILITOT 0.4 10/26/2018 0955   BILITOT 0.3 03/01/2018 0818   BILITOT 0.48 02/18/2017 1221   GFRNONAA 76 10/26/2018 0955   GFRNONAA >60 03/01/2018 0818   GFRAA 87 10/26/2018 0955   GFRAA >60 03/01/2018 0818    No results found for: Ronnald Ramp, A1GS, A2GS, BETS, BETA2SER, GAMS, MSPIKE, SPEI  No results found for: Nils Pyle, [facility]  Lab Results  Component Value Date   WBC 5.0 10/03/2019   NEUTROABS 2.6 10/03/2019   HGB 14.4 10/03/2019   HCT 42.7 10/03/2019   MCV 89.9 10/03/2019   PLT 219 10/03/2019      Chemistry      Component Value Date/Time   NA 139 10/26/2018 0955   NA 142 02/18/2017 1221   K 4.6 10/26/2018 0955   K 4.6 02/18/2017 1221   CL 100 10/26/2018 0955   CO2 25 10/26/2018 0955   CO2 27 02/18/2017 1221   BUN 14 10/26/2018 0955   BUN 18.4 02/18/2017 1221   CREATININE 0.89 10/26/2018 0955   CREATININE 1.05 (H) 03/01/2018 0818   CREATININE 1.0 02/18/2017 1221      Component Value Date/Time   CALCIUM 10.0 10/26/2018 0955   CALCIUM 10.3 02/18/2017 1221   ALKPHOS 115 11/10/2018 0834   ALKPHOS 89 02/18/2017 1221   AST 36 11/10/2018 0834   AST 38 03/01/2018 0818   AST 22 02/18/2017 1221   ALT 38 (H) 11/10/2018 0834   ALT 34 03/01/2018 0818   ALT 18 02/18/2017 1221   BILITOT 0.6 11/10/2018 0834   BILITOT 0.4 10/26/2018 0955   BILITOT 0.3 03/01/2018 0818   BILITOT 0.48 02/18/2017 1221       No results found for: LABCA2  No components found for: KZLDJT701  No results for input(s): INR in the last 168 hours.  No results found for: LABCA2  No results found for: XBL390  No results found for: ZES923  No results found for:  RAQ762  No results found for: CA2729  No components found for: HGQUANT  No results found for: CEA1 / No results found for: CEA1   No results found for: AFPTUMOR  No results  found for: CHROMOGRNA  No results found for: PSA1  Appointment on 10/03/2019  Component Date Value Ref Range Status  . WBC 10/03/2019 5.0  4.0 - 10.5 K/uL Final  . RBC 10/03/2019 4.75  3.87 - 5.11 MIL/uL Final  . Hemoglobin 10/03/2019 14.4  12.0 - 15.0 g/dL Final  . HCT 10/03/2019 42.7  36 - 46 % Final  . MCV 10/03/2019 89.9  80.0 - 100.0 fL Final  . MCH 10/03/2019 30.3  26.0 - 34.0 pg Final  . MCHC 10/03/2019 33.7  30.0 - 36.0 g/dL Final  . RDW 10/03/2019 11.9  11.5 - 15.5 % Final  . Platelets 10/03/2019 219  150 - 400 K/uL Final  . nRBC 10/03/2019 0.0  0.0 - 0.2 % Final  . Neutrophils Relative % 10/03/2019 51  % Final  . Neutro Abs 10/03/2019 2.6  1.7 - 7.7 K/uL Final  . Lymphocytes Relative 10/03/2019 38  % Final  . Lymphs Abs 10/03/2019 1.9  0.7 - 4.0 K/uL Final  . Monocytes Relative 10/03/2019 7  % Final  . Monocytes Absolute 10/03/2019 0.4  0 - 1 K/uL Final  . Eosinophils Relative 10/03/2019 3  % Final  . Eosinophils Absolute 10/03/2019 0.2  0 - 0 K/uL Final  . Basophils Relative 10/03/2019 1  % Final  . Basophils Absolute 10/03/2019 0.0  0 - 0 K/uL Final  . Immature Granulocytes 10/03/2019 0  % Final  . Abs Immature Granulocytes 10/03/2019 0.01  0.00 - 0.07 K/uL Final   Performed at [facility], [place] [address] [place], [place] [address]    (this displays the last labs from the last 3 days)  No results found for: TOTALPROTELP, ALBUMINELP, A1GS, A2GS, BETS, BETA2SER, GAMS, MSPIKE, SPEI (this displays SPEP labs)  No results found for: KPAFRELGTCHN, LAMBDASER, KAPLAMBRATIO (kappa/lambda light chains)  No results found for: HGBA, HGBA2QUANT, HGBFQUANT, HGBSQUAN (Hemoglobinopathy evaluation)   No results found for: LDH  No results found for: IRON, TIBC,  IRONPCTSAT (Iron and TIBC)  No results found for: FERRITIN  Urinalysis No results found for: COLORURINE, APPEARANCEUR, LABSPEC, PHURINE, GLUCOSEU, HGBUR, BILIRUBINUR, KETONESUR, PROTEINUR, UROBILINOGEN, NITRITE, LEUKOCYTESUR   STUDIES: No results found.   ELIGIBLE FOR AVAILABLE RESEARCH PROTOCOL: UPBEAT; did not qualify for BR 003  ASSESSMENT: 54 y.o. [place] woman status post right breast lower inner quadrant biopsy February 09, 2017 for a clinical T1b N0, stage IB invasive ductal carcinoma, grade 3, triple negative  (1) status post right lumpectomy 03/23/2017 for a pT1b pN0, stage IB invasive ductal carcinoma, grade 3, with negative margins.  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 The patient then received a boost to the seroma. This delivered an additional 10 Gy in 5 fractions. The total dose was 60.4 Gy.  (4) genetics testing 02/19/2017 through the STAT Breast panel with reflext to Multi-Cancer panel (83 genes) @ [facility] - No pathogenic mutations detected in ALK, APC, ATM, AXIN2, BAP1, BARD1, BLM, BMPR1A, BRCA1, BRCA2, BRIP1, CASR, CDC73, CDH1, CDK4, CDKN1B, CDKN1C, CDKN2A, CEBPA, CHEK2, CTNNA1, DICER1, DIS3L2, EGFR, EPCAM, FH, FLCN, GATA2, GPC3, GREM1, HOXB13, HRAS, KIT, MAX, MEN1, MET, MITF, MLH1, MSH2, MSH3, MSH6, MUTYH, NBN, NF1, NF2, NTHL1, PALB2, PDGFRA, PHOX2B,  PMS2, POLD1, POLE, POT1, PRKAR1A, PTCH1, PTEN, RAD50, RAD51C, RAD51D, RB1, RECQL4, RET, RUNX1, SDHA, SDHAF2, SDHB, SDHC, SDHD, SMAD4, SMARCA4, SMARCB1, SMARCE1, STK11, SUFU, TERC, TERT, TMEM127, TP53, TSC1, TSC2, VHL, WRN, WT1.  (a) Variants of Uncertain  Significance in PDGFRA c.3040G>A (p.Ala1014Thr) and SMARCA4 c.2044C>G (p.Leu682Val) noted   PLAN: Kelsey Henry is now 2-1/2 years out from definitive surgery for her breast cancer with no evidence of disease recurrence.  This is very favorable.  I wrote her for a right compression sleeve and glove.  She is already scheduled for repeat mammography in October.  She will return to see me in a year.  I commended her exercise program which is excellent.  She knows to call for any other issue that may develop before then  Total encounter time 20 minutes.*   Kelsey Henry, Kelsey Dad, MD  10/03/19 9:36 AM Medical Oncology and Hematology [facility] [place], [place] [address] Tel. [phone]    Fax. [phone]   I, Wilburn Mylar, am acting as scribe for Dr. Virgie Henry. Kelsey Henry.  I, Lurline Del MD, have reviewed the above documentation for accuracy and completeness, and I agree with the above.   *Total Encounter Time as defined by the Centers for Medicare and Medicaid Services includes, in addition to the face-to-face time of a patient visit (documented in the note above) non-face-to-face time: obtaining and reviewing outside history, ordering and reviewing medications, tests or procedures, care coordination (communications with other health care professionals or caregivers) and documentation in the medical record.

## 2019-10-03 ENCOUNTER — Inpatient Hospital Stay: Payer: BC Managed Care – PPO | Attending: Oncology

## 2019-10-03 ENCOUNTER — Inpatient Hospital Stay (HOSPITAL_BASED_OUTPATIENT_CLINIC_OR_DEPARTMENT_OTHER): Payer: BC Managed Care – PPO | Admitting: Oncology

## 2019-10-03 ENCOUNTER — Other Ambulatory Visit: Payer: Self-pay

## 2019-10-03 ENCOUNTER — Telehealth: Payer: Self-pay | Admitting: Oncology

## 2019-10-03 VITALS — BP 129/86 | HR 72 | Temp 97.8°F | Resp 17 | Ht 60.0 in | Wt 116.9 lb

## 2019-10-03 DIAGNOSIS — Z923 Personal history of irradiation: Secondary | ICD-10-CM | POA: Diagnosis not present

## 2019-10-03 DIAGNOSIS — Z171 Estrogen receptor negative status [ER-]: Secondary | ICD-10-CM | POA: Insufficient documentation

## 2019-10-03 DIAGNOSIS — Z9221 Personal history of antineoplastic chemotherapy: Secondary | ICD-10-CM | POA: Diagnosis not present

## 2019-10-03 DIAGNOSIS — K219 Gastro-esophageal reflux disease without esophagitis: Secondary | ICD-10-CM | POA: Insufficient documentation

## 2019-10-03 DIAGNOSIS — Z853 Personal history of malignant neoplasm of breast: Secondary | ICD-10-CM | POA: Diagnosis not present

## 2019-10-03 DIAGNOSIS — E119 Type 2 diabetes mellitus without complications: Secondary | ICD-10-CM | POA: Diagnosis not present

## 2019-10-03 DIAGNOSIS — Z79899 Other long term (current) drug therapy: Secondary | ICD-10-CM | POA: Diagnosis not present

## 2019-10-03 DIAGNOSIS — C50311 Malignant neoplasm of lower-inner quadrant of right female breast: Secondary | ICD-10-CM | POA: Diagnosis not present

## 2019-10-03 DIAGNOSIS — I10 Essential (primary) hypertension: Secondary | ICD-10-CM | POA: Diagnosis not present

## 2019-10-03 LAB — CBC WITH DIFFERENTIAL/PLATELET
Abs Immature Granulocytes: 0.01 10*3/uL (ref 0.00–0.07)
Basophils Absolute: 0 10*3/uL (ref 0.0–0.1)
Basophils Relative: 1 %
Eosinophils Absolute: 0.2 10*3/uL (ref 0.0–0.5)
Eosinophils Relative: 3 %
HCT: 42.7 % (ref 36.0–46.0)
Hemoglobin: 14.4 g/dL (ref 12.0–15.0)
Immature Granulocytes: 0 %
Lymphocytes Relative: 38 %
Lymphs Abs: 1.9 10*3/uL (ref 0.7–4.0)
MCH: 30.3 pg (ref 26.0–34.0)
MCHC: 33.7 g/dL (ref 30.0–36.0)
MCV: 89.9 fL (ref 80.0–100.0)
Monocytes Absolute: 0.4 10*3/uL (ref 0.1–1.0)
Monocytes Relative: 7 %
Neutro Abs: 2.6 10*3/uL (ref 1.7–7.7)
Neutrophils Relative %: 51 %
Platelets: 219 10*3/uL (ref 150–400)
RBC: 4.75 MIL/uL (ref 3.87–5.11)
RDW: 11.9 % (ref 11.5–15.5)
WBC: 5 10*3/uL (ref 4.0–10.5)
nRBC: 0 % (ref 0.0–0.2)

## 2019-10-03 LAB — COMPREHENSIVE METABOLIC PANEL
ALT: 39 U/L (ref 0–44)
AST: 37 U/L (ref 15–41)
Albumin: 4.4 g/dL (ref 3.5–5.0)
Alkaline Phosphatase: 129 U/L — ABNORMAL HIGH (ref 38–126)
Anion gap: 11 (ref 5–15)
BUN: 23 mg/dL — ABNORMAL HIGH (ref 6–20)
CO2: 28 mmol/L (ref 22–32)
Calcium: 10.1 mg/dL (ref 8.9–10.3)
Chloride: 105 mmol/L (ref 98–111)
Creatinine, Ser: 1.03 mg/dL — ABNORMAL HIGH (ref 0.44–1.00)
GFR calc Af Amer: >60 mL/min (ref >60)
GFR calc non Af Amer: >60 mL/min (ref >60)
Glucose, Bld: 93 mg/dL (ref 70–99)
Potassium: 4.9 mmol/L (ref 3.5–5.1)
Sodium: 144 mmol/L (ref 135–145)
Total Bilirubin: 0.5 mg/dL (ref 0.3–1.2)
Total Protein: 7.6 g/dL (ref 6.5–8.1)

## 2019-10-03 NOTE — Telephone Encounter (Signed)
Scheduled appts per 6/21 los. Gave pt a print out of AVS.  

## 2019-10-15 IMAGING — DX DG CHEST 1V PORT
1 series · 1 of 1 positions shown · non-contrast
Comparison: 03/06/2015

CLINICAL DATA: Port-A-Cath insertion

EXAM:
PORTABLE CHEST 1 VIEW

[chest ap]
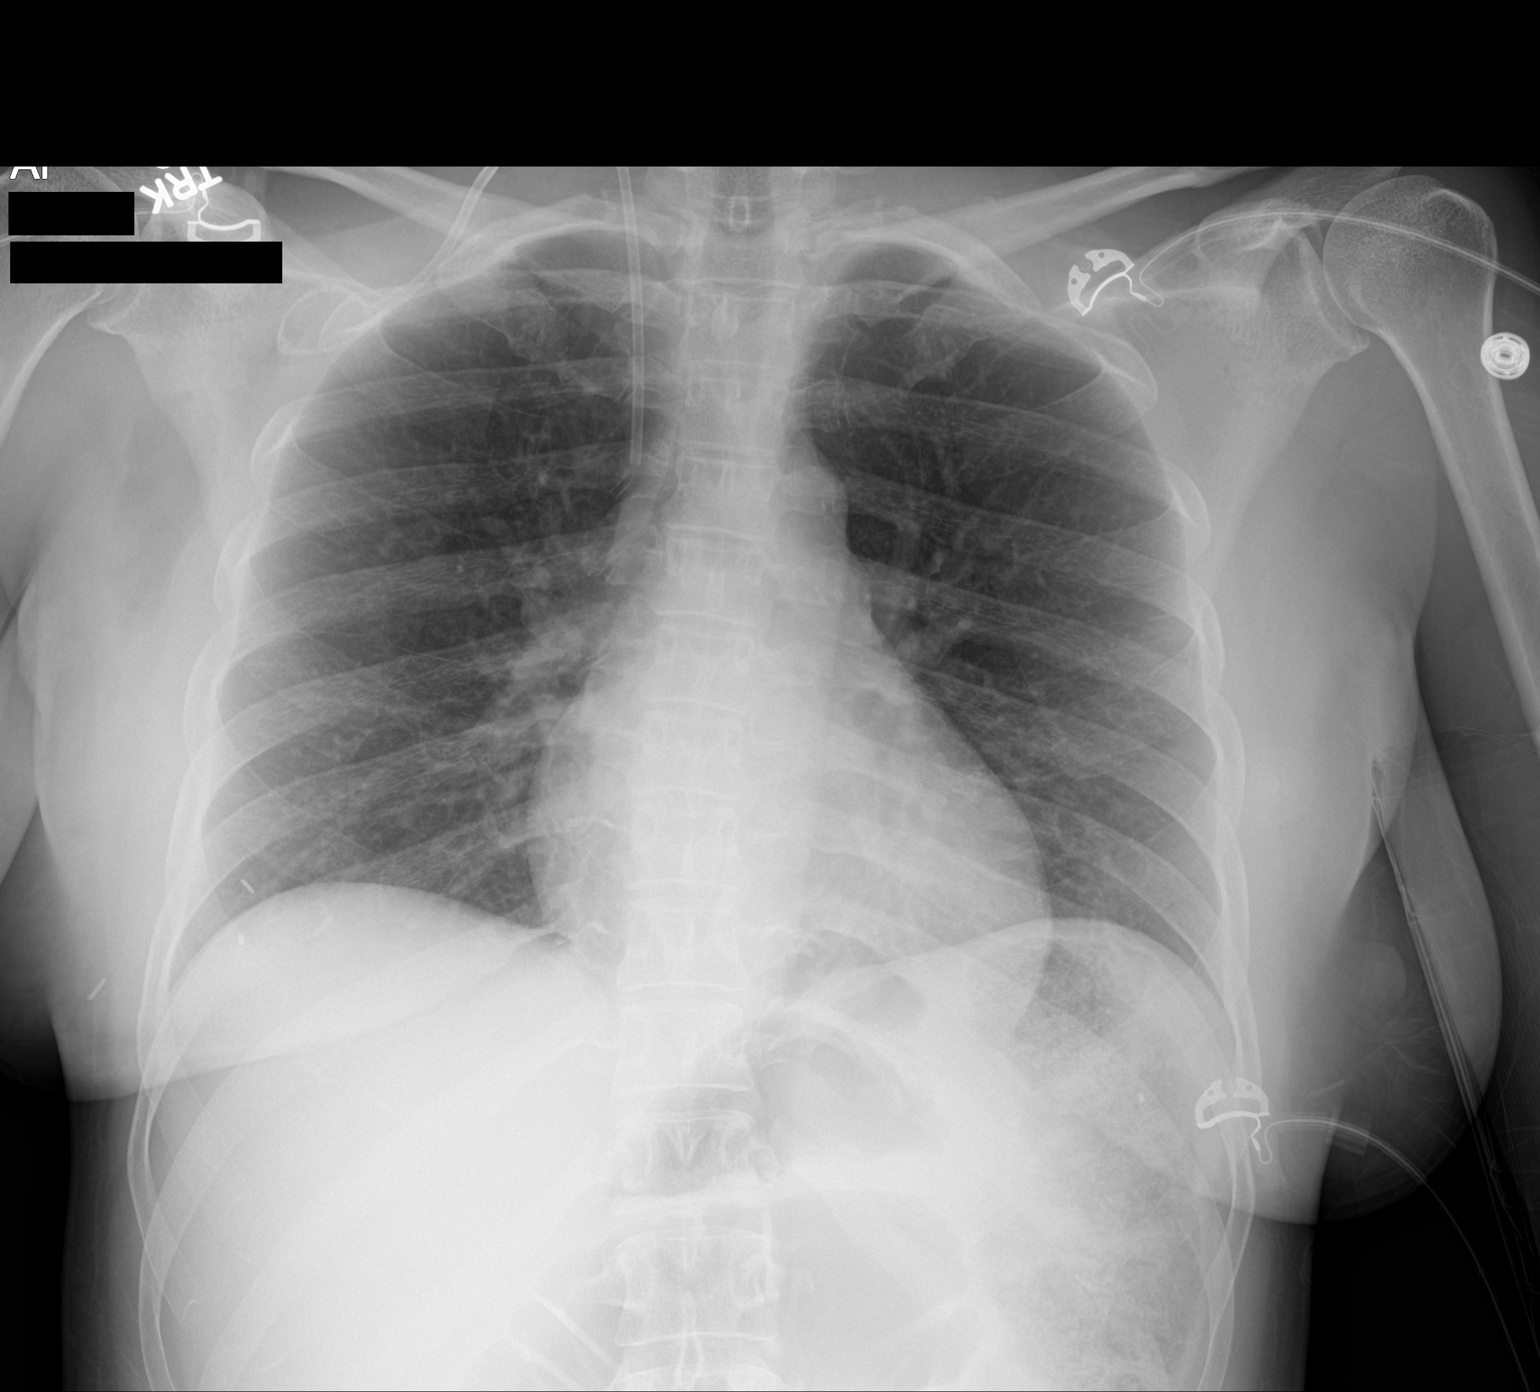

[1 of 1 positions shown; findings below may reference images not displayed]

FINDINGS: Right jugular Port-A-Cath has been placed. Tip is in the upper SVC.
Heart is normal in size. Clear lungs. No pneumothorax.
IMPRESSION: Right jugular Port-A-Cath placement as described. Tip is in the
upper SVC.

## 2019-10-15 IMAGING — MG MM BREAST SURGICAL SPECIMEN
1 series · 1 of 1 positions shown · non-contrast
Comparison: Previous exam(s).

CLINICAL DATA: Right breast cancer status post right lumpectomy.

EXAM:
SPECIMEN RADIOGRAPH OF THE RIGHT BREAST

[R]
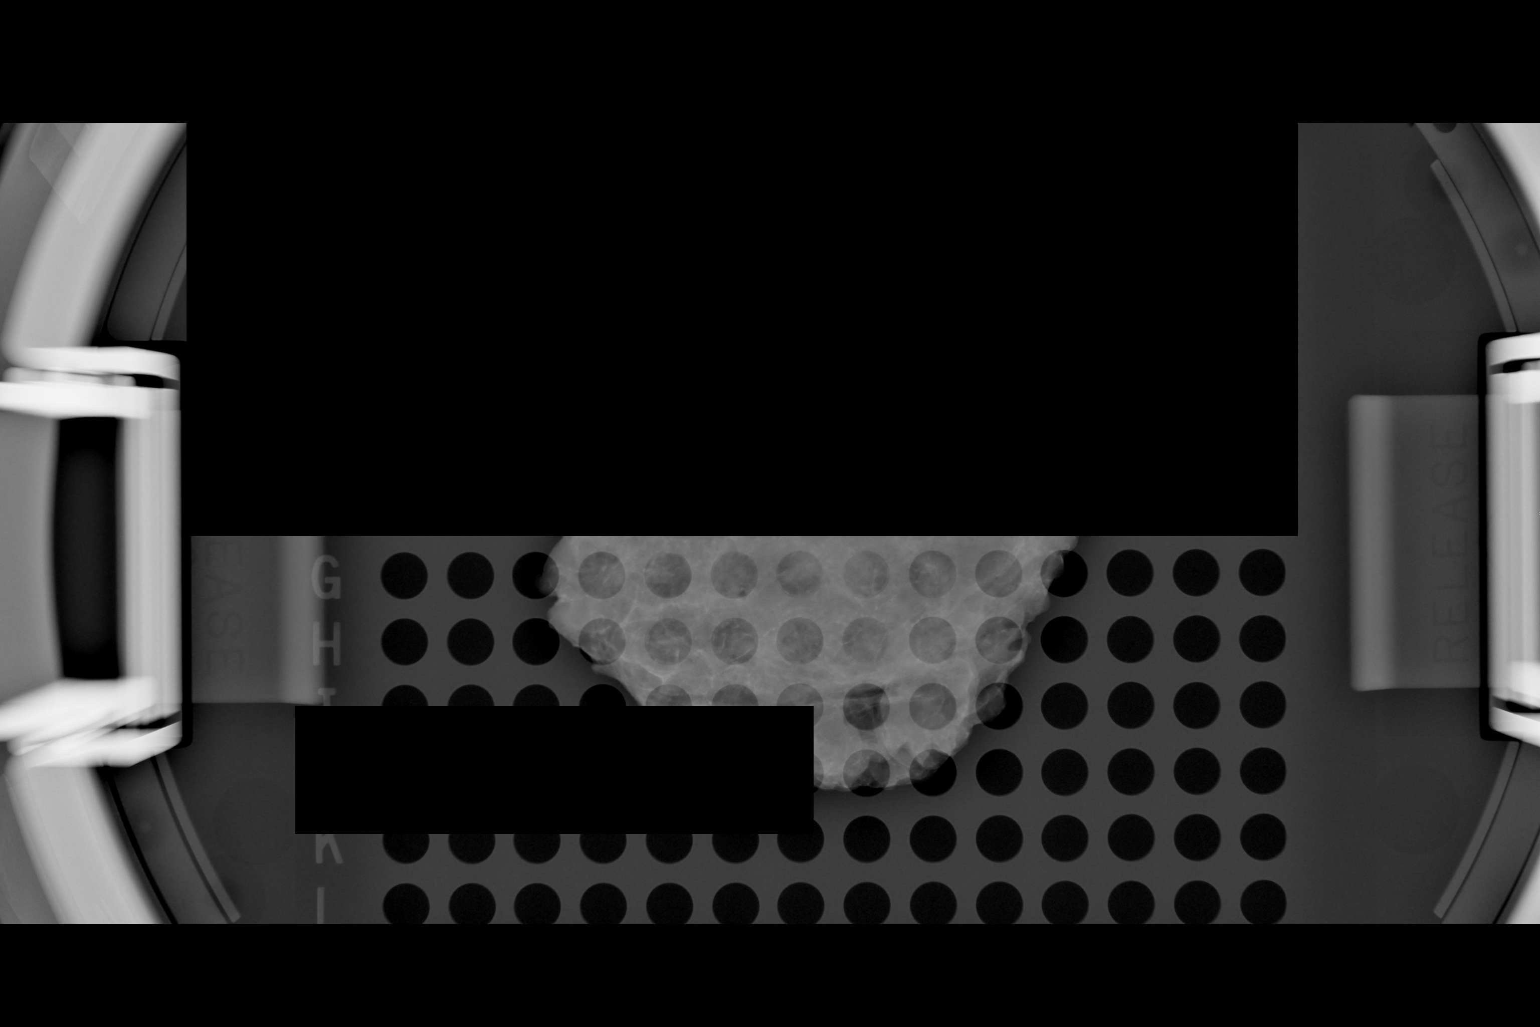

[1 of 1 positions shown; findings below may reference images not displayed]

FINDINGS: Status post excision of the right breast. The radioactive seed and
ribbon biopsy marker clip are present, completely intact, and were
marked for pathology.
IMPRESSION: Specimen radiograph of the right breast.

## 2019-10-28 ENCOUNTER — Encounter: Payer: Self-pay | Admitting: Family

## 2019-10-28 ENCOUNTER — Other Ambulatory Visit: Payer: Self-pay

## 2019-10-28 ENCOUNTER — Ambulatory Visit (INDEPENDENT_AMBULATORY_CARE_PROVIDER_SITE_OTHER): Payer: BC Managed Care – PPO | Admitting: Family

## 2019-10-28 VITALS — BP 110/74 | HR 74 | Temp 97.2°F | Resp 16 | Ht 60.0 in | Wt 116.0 lb

## 2019-10-28 DIAGNOSIS — Z0000 Encounter for general adult medical examination without abnormal findings: Secondary | ICD-10-CM | POA: Diagnosis not present

## 2019-10-28 NOTE — Patient Instructions (Signed)
Please complete lab work prior to leaving.  Keep up the great work with healthy diet and exercise.    Preventive Care 74-52 Years Old, Female Preventive care refers to visits with your health care provider and lifestyle choices that can promote health and wellness. This includes:  A yearly physical exam. This may also be called an annual well check.  Regular dental visits and eye exams.  Immunizations.  Screening for certain conditions.  Healthy lifestyle choices, such as eating a healthy diet, getting regular exercise, not using drugs or products that contain nicotine and tobacco, and limiting alcohol use. What can I expect for my preventive care visit? Physical exam Your health care provider will check your:  Height and weight. This may be used to calculate body mass index (BMI), which tells if you are at a healthy weight.  Heart rate and blood pressure.  Skin for abnormal spots. Counseling Your health care provider may ask you questions about your:  Alcohol, tobacco, and drug use.  Emotional well-being.  Home and relationship well-being.  Sexual activity.  Eating habits.  Work and work Astronomer.  Method of birth control.  Menstrual cycle.  Pregnancy history. What immunizations do I need?  Influenza (flu) vaccine  This is recommended every year. Tetanus, diphtheria, and pertussis (Tdap) vaccine  You may need a Td booster every 10 years. Varicella (chickenpox) vaccine  You may need this if you have not been vaccinated. Zoster (shingles) vaccine  You may need this after age 52. Measles, mumps, and rubella (MMR) vaccine  You may need at least one dose of MMR if you were born in 1957 or later. You may also need a second dose. Pneumococcal conjugate (PCV13) vaccine  You may need this if you have certain conditions and were not previously vaccinated. Pneumococcal polysaccharide (PPSV23) vaccine  You may need one or two doses if you smoke cigarettes or  if you have certain conditions. Meningococcal conjugate (MenACWY) vaccine  You may need this if you have certain conditions. Hepatitis A vaccine  You may need this if you have certain conditions or if you travel or work in places where you may be exposed to hepatitis A. Hepatitis B vaccine  You may need this if you have certain conditions or if you travel or work in places where you may be exposed to hepatitis B. Haemophilus influenzae type b (Hib) vaccine  You may need this if you have certain conditions. Human papillomavirus (HPV) vaccine  If recommended by your health care provider, you may need three doses over 6 months. You may receive vaccines as individual doses or as more than one vaccine together in one shot (combination vaccines). Talk with your health care provider about the risks and benefits of combination vaccines. What tests do I need? Blood tests  Lipid and cholesterol levels. These may be checked every 5 years, or more frequently if you are over 16 years old.  Hepatitis C test.  Hepatitis B test. Screening  Lung cancer screening. You may have this screening every year starting at age 34 if you have a 30-pack-year history of smoking and currently smoke or have quit within the past 15 years.  Colorectal cancer screening. All adults should have this screening starting at age 41 and continuing until age 75. Your health care provider may recommend screening at age 59 if you are at increased risk. You will have tests every 1-10 years, depending on your results and the type of screening test.  Diabetes screening. This  is done by checking your blood sugar (glucose) after you have not eaten for a while (fasting). You may have this done every 1-3 years.  Mammogram. This may be done every 1-2 years. Talk with your health care provider about when you should start having regular mammograms. This may depend on whether you have a family history of breast cancer.  BRCA-related  cancer screening. This may be done if you have a family history of breast, ovarian, tubal, or peritoneal cancers.  Pelvic exam and Pap test. This may be done every 3 years starting at age 48. Starting at age 89, this may be done every 5 years if you have a Pap test in combination with an HPV test. Other tests  Sexually transmitted disease (STD) testing.  Bone density scan. This is done to screen for osteoporosis. You may have this scan if you are at high risk for osteoporosis. Follow these instructions at home: Eating and drinking  Eat a diet that includes fresh fruits and vegetables, whole grains, lean protein, and low-fat dairy.  Take vitamin and mineral supplements as recommended by your health care provider.  Do not drink alcohol if: ? Your health care provider tells you not to drink. ? You are pregnant, may be pregnant, or are planning to become pregnant.  If you drink alcohol: ? Limit how much you have to 0-1 drink a day. ? Be aware of how much alcohol is in your drink. In the [place] one drink equals one 12 oz bottle of beer (355 mL), one 5 oz glass of wine (148 mL), or one 1 oz glass of hard liquor (44 mL). Lifestyle  Take daily care of your teeth and gums.  Stay active. Exercise for at least 30 minutes on 5 or more days each week.  Do not use any products that contain nicotine or tobacco, such as cigarettes, e-cigarettes, and chewing tobacco. If you need help quitting, ask your health care provider.  If you are sexually active, practice safe sex. Use a condom or other form of birth control (contraception) in order to prevent pregnancy and STIs (sexually transmitted infections).  If told by your health care provider, take low-dose aspirin daily starting at age 51. What's next?  Visit your health care provider once a year for a well check visit.  Ask your health care provider how often you should have your eyes and teeth checked.  Stay up to date on all vaccines. This  information is not intended to replace advice given to you by your health care provider. Make sure you discuss any questions you have with your health care provider. Document Revised: 12/10/2017 Document Reviewed: 12/10/2017 Elsevier Patient Education  2020 [employer].

## 2019-10-28 NOTE — Progress Notes (Signed)
Subjective:    Patient ID: Kelsey Henry, female    DOB: March 23, 1966, 54 y.o.   MRN: [id number]  HPI  Patient presents today for complete physical.  Immunizations: completed pfizer series.  Tdap 2020- shingrix Diet: healthy Wt Readings from Last 3 Encounters:  10/28/19 116 lb (52.6 kg)  10/03/19 116 lb 14.4 oz (53 kg)  10/26/18 114 lb (51.7 kg)  Exercise: 6 days a week Colonoscopy: 11/08/18 Pap Smear: 01/12/18  Mammogram: 02/10/19 Vision: up to date Dental: up to date    Review of Systems  Constitutional: Negative for unexpected weight change.  HENT: Negative for hearing loss and rhinorrhea.   Respiratory: Negative for cough and shortness of breath.   Cardiovascular: Negative for chest pain.  Gastrointestinal: Negative for blood in stool, constipation and diarrhea.  Genitourinary: Negative for dysuria, frequency and hematuria.  Musculoskeletal: Negative for arthralgias and myalgias.  Skin: Negative for rash.  Neurological: Negative for headaches.  Hematological: Negative for adenopathy.  Psychiatric/Behavioral:       Denies depression/anxiety       Past Medical History:  Diagnosis Date  . Cancer [facility])    breast  . Genetic testing 02/19/2017   STAT Breast panel with reflext to Multi-Cancer panel (83 genes) @ [facility] - No pathogenic mutations detected  . GERD (gastroesophageal reflux disease)    no meds  . Personal history of chemotherapy    2019  . Personal history of radiation therapy    2019  . Seasonal allergies   . SVD (spontaneous vaginal delivery)    x 1     Social History   Socioeconomic History  . Marital status: Married    Spouse name: Not on file  . Number of children: Not on file  . Years of education: Not on file  . Highest education level: Not on file  Occupational History  . Not on file  Tobacco Use  . Smoking status: Never Smoker  . Smokeless tobacco: Never Used  Substance and Sexual Activity  . Alcohol use: No  . Drug use:  No  . Sexual activity: Yes    Partners: Male    Birth control/protection: Surgical  Other Topics Concern  . 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 Difficulty of Paying Living Expenses:   Food Insecurity:   . Worried About Charity fundraiser in the Last Year:   . Arboriculturist in the Last Year:   Transportation Needs:   . Film/video editor (Medical):   Marland Kitchen Lack of Transportation (Non-Medical):   Physical Activity:   . Days of Exercise per Week:   . Minutes of Exercise per Session:   Stress:   . Feeling of Stress :   Social Connections:   . Frequency of Communication with Friends and Family:   . Frequency of Social Gatherings with Friends and Family:   . Attends Religious Services:   . Active Member of Clubs or Organizations:   . Attends Archivist Meetings:   Marland Kitchen Marital Status:   Intimate Partner Violence:   . Fear of Current or Ex-Partner:   . Emotionally Abused:   Marland Kitchen Physically  Abused:   . Sexually Abused:     Past Surgical History:  Procedure Laterality Date  . ABDOMINAL HYSTERECTOMY    . BILATERAL SALPINGECTOMY Bilateral 06/23/2012   Procedure: BILATERAL SALPINGECTOMY;  Surgeon: Cheri Fowler, MD;  Location: [facility];  Service: Gynecology;  Laterality: Bilateral;  . BREAST LUMPECTOMY Right 03/23/2017  . BREAST LUMPECTOMY WITH RADIOACTIVE SEED AND SENTINEL LYMPH NODE BIOPSY Right 03/23/2017   Procedure: BREAST LUMPECTOMY WITH RADIOACTIVE SEED AND SENTINEL LYMPH NODE BIOPSY;  Surgeon: Rolm Bookbinder, MD;  Location: [place];  Service: General;  Laterality: Right;  . LAPAROSCOPIC SUPRACERVICAL HYSTERECTOMY N/A 06/23/2012   Procedure: LAPAROSCOPIC SUPRACERVICAL  HYSTERECTOMY;  Surgeon: Cheri Fowler, MD;  Location: [facility];  Service: Gynecology;  Laterality: N/A;  . PORT-A-CATH REMOVAL N/A 08/13/2017   Procedure: REMOVAL PORT-A-CATH;  Surgeon: Rolm Bookbinder, MD;  Location: [place];  Service: General;  Laterality: N/A;  . PORTACATH PLACEMENT Right 03/23/2017   Procedure: INSERTION PORT-A-CATH;  Surgeon: Rolm Bookbinder, MD;  Location: [place];  Service: General;  Laterality: Right;  . TUBAL LIGATION    . VULVAR LESION REMOVAL N/A 11/01/2014   Procedure: MONS PUBIS LESION;  Surgeon: Cheri Fowler, MD;  Location: [facility];  Service: Gynecology;  Laterality: N/A;  local anesthesia with IV sedation if needed  . WISDOM TOOTH EXTRACTION      Family History  Problem Relation Age of Onset  . Hypertension Mother   . Diabetes Mellitus II Mother   . Hypertension Sister   . Hypertension Sister   . Breast cancer Neg Hx   . Pancreatic cancer Neg Hx   . Colon cancer Neg Hx     Allergies  Allergen Reactions  . Aspirin Hives    Current Outpatient Medications on File Prior to Visit  Medication Sig Dispense Refill  . loratadine (CLARITIN) 10 MG tablet Take 10 mg by mouth daily as needed for allergies.    . Multiple Vitamins-Minerals (MULTIVITAMIN ADULT) TABS Take by mouth.    . Probiotic Product (PROBIOTIC-10 PO) Take 1 capsule by mouth daily.     No current facility-administered medications on file prior to visit.    BP 110/74 (BP Location: Left Arm, Patient Position: Sitting, Cuff Size: Normal)   Pulse 74   Temp (!) 97.2 F (36.2 C) (Temporal)   Resp 16   Ht 5' (1.524 m)   Wt 116 lb (52.6 kg)   LMP 05/26/2012   SpO2 99%   BMI 22.65 kg/m    Objective:   Physical Exam  Physical Exam  Constitutional: She is oriented to person, place, and time. She appears well-developed and well-nourished. No distress.  HENT:  Head: Normocephalic and atraumatic.  Right Ear: Tympanic membrane and ear canal normal.  Left Ear: Tympanic  membrane and ear canal normal.  Mouth/Throat: Not examined- pt wearing mask Eyes: Pupils are equal, round, and reactive to light. No scleral icterus.  Neck: Normal range of motion. No thyromegaly present.  Cardiovascular: Normal rate and regular rhythm.   No murmur heard. Pulmonary/Chest: Effort normal and breath sounds normal. No respiratory distress. He has no wheezes. She has no rales. She exhibits no tenderness.  Abdominal: Soft. Bowel sounds are normal. She exhibits no distension and no mass. There is no tenderness. There is no rebound and no guarding.  Musculoskeletal: She exhibits no edema.  Lymphadenopathy:    She has no cervical adenopathy.  Neurological: She is alert and oriented to person, place, and time. She has normal patellar reflexes. She exhibits  normal muscle tone. Coordination normal.  Skin: Skin is warm and dry.  Psychiatric: She has a normal mood and affect. Her behavior is normal. Judgment and thought content normal.  Breast/pelvis: deferred      Assessment & Plan:  Preventative care- She is doing great with diet/exercise and weight maintenance.  Mammo, pap, colo up to date.  Shingrix #1 today. Tetanus and covid vaccines up to date.  This visit occurred during the SARS-CoV-2 public health emergency.  Safety protocols were in place, including screening questions prior to the visit, additional usage of staff PPE, and extensive cleaning of exam room while observing appropriate contact time as indicated for disinfecting solutions.          Assessment & Plan:

## 2019-10-29 ENCOUNTER — Encounter: Payer: Self-pay | Admitting: Family

## 2019-10-29 LAB — LIPID PANEL
Chol/HDL Ratio: 2.5 ratio (ref 0.0–4.4)
Cholesterol, Total: 230 mg/dL — ABNORMAL HIGH (ref 100–199)
HDL: 93 mg/dL (ref >39)
LDL Chol Calc (NIH): 127 mg/dL — ABNORMAL HIGH (ref 0–99)
Triglycerides: 60 mg/dL (ref 0–149)
VLDL Cholesterol Cal: 10 mg/dL (ref 5–40)

## 2019-10-29 LAB — TSH: TSH: 4.29 u[IU]/mL (ref 0.450–4.500)

## 2019-10-31 ENCOUNTER — Encounter: Payer: Self-pay | Admitting: Family

## 2019-11-03 NOTE — Progress Notes (Signed)
Mailed out to pt 

## 2019-11-23 DIAGNOSIS — N390 Urinary tract infection, site not specified: Secondary | ICD-10-CM | POA: Diagnosis not present

## 2019-11-23 DIAGNOSIS — J019 Acute sinusitis, unspecified: Secondary | ICD-10-CM | POA: Diagnosis not present

## 2019-12-16 ENCOUNTER — Other Ambulatory Visit: Payer: Self-pay | Admitting: Oncology

## 2019-12-16 DIAGNOSIS — Z9889 Other specified postprocedural states: Secondary | ICD-10-CM

## 2019-12-16 DIAGNOSIS — Z853 Personal history of malignant neoplasm of breast: Secondary | ICD-10-CM

## 2019-12-30 ENCOUNTER — Other Ambulatory Visit: Payer: Self-pay

## 2019-12-30 ENCOUNTER — Ambulatory Visit (INDEPENDENT_AMBULATORY_CARE_PROVIDER_SITE_OTHER): Payer: BC Managed Care – PPO

## 2019-12-30 DIAGNOSIS — Z23 Encounter for immunization: Secondary | ICD-10-CM

## 2019-12-30 NOTE — Progress Notes (Signed)
Patient here today for shingrix vaccine. 0.18mL given in left deltoid IM. Patient tolerated well. [facility] updated.

## 2020-01-28 ENCOUNTER — Ambulatory Visit: Payer: BC Managed Care – PPO

## 2020-02-07 DIAGNOSIS — Z6823 Body mass index (BMI) 23.0-23.9, adult: Secondary | ICD-10-CM | POA: Diagnosis not present

## 2020-02-07 DIAGNOSIS — Z1389 Encounter for screening for other disorder: Secondary | ICD-10-CM | POA: Diagnosis not present

## 2020-02-07 DIAGNOSIS — Z01419 Encounter for gynecological examination (general) (routine) without abnormal findings: Secondary | ICD-10-CM | POA: Diagnosis not present

## 2020-02-07 DIAGNOSIS — R82998 Other abnormal findings in urine: Secondary | ICD-10-CM | POA: Diagnosis not present

## 2020-02-07 DIAGNOSIS — Z130 Encounter for screening for diseases of the blood and blood-forming organs and certain disorders involving the immune mechanism: Secondary | ICD-10-CM | POA: Diagnosis not present

## 2020-02-13 ENCOUNTER — Ambulatory Visit
Admission: RE | Admit: 2020-02-13 | Discharge: 2020-02-13 | Disposition: A | Discharge status: Home / Self Care | Payer: BC Managed Care – PPO | Source: Ambulatory Visit | Attending: Oncology | Admitting: Oncology

## 2020-02-13 ENCOUNTER — Other Ambulatory Visit: Payer: Self-pay

## 2020-02-13 DIAGNOSIS — Z9889 Other specified postprocedural states: Secondary | ICD-10-CM

## 2020-02-13 DIAGNOSIS — R922 Inconclusive mammogram: Secondary | ICD-10-CM | POA: Diagnosis not present

## 2020-02-13 DIAGNOSIS — Z853 Personal history of malignant neoplasm of breast: Secondary | ICD-10-CM

## 2020-02-23 ENCOUNTER — Ambulatory Visit: Payer: BC Managed Care – PPO | Attending: Internal Medicine

## 2020-02-23 ENCOUNTER — Other Ambulatory Visit: Payer: Self-pay

## 2020-02-23 DIAGNOSIS — Z23 Encounter for immunization: Secondary | ICD-10-CM

## 2020-02-23 NOTE — Progress Notes (Signed)
   Covid-19 Vaccination Clinic  Name:  Kelsey Henry    MRN: [id number] DOB: July 21, 1965  02/23/2020  [name] was observed post Covid-19 immunization for 15 minutes without incident. She was provided with Vaccine Information Sheet and instruction to access the V-Safe system.   [name] was instructed to call 911 with any severe reactions post vaccine: Marland Kitchen Difficulty breathing  . Swelling of face and throat  . A fast heartbeat  . A bad rash all over body  . Dizziness and weakness

## 2020-03-10 DIAGNOSIS — R519 Headache, unspecified: Secondary | ICD-10-CM | POA: Diagnosis not present

## 2020-03-10 DIAGNOSIS — J3089 Other allergic rhinitis: Secondary | ICD-10-CM | POA: Diagnosis not present

## 2020-03-10 DIAGNOSIS — R0981 Nasal congestion: Secondary | ICD-10-CM | POA: Diagnosis not present

## 2020-03-10 DIAGNOSIS — Z20822 Contact with and (suspected) exposure to covid-19: Secondary | ICD-10-CM | POA: Diagnosis not present

## 2020-06-12 ENCOUNTER — Telehealth: Payer: Self-pay | Admitting: *Deleted

## 2020-06-12 NOTE — Telephone Encounter (Signed)
Spoke with patient regarding a request for a copy of her genetic results.  Informed her I would mail a copy to her. Patient verbalized understanding.

## 2020-10-01 NOTE — Progress Notes (Signed)
[place]  Telephone:([phone]) [phone] Fax:(336) [phone]     ID: Colbert Coyer DOB: December 14, 1965  MR#: [id number]  JGO#:[id number]  Patient Care Team: Kelsey Alar, NP as PCP - General (Internal Medicine) Kelsey Henry, Kelsey Dad, MD as Consulting Physician (Oncology) Kelsey Rudd, MD as Consulting Physician (Radiation Oncology) Kelsey Bookbinder, MD as Consulting Physician (General Surgery) Kelsey Henry, Kelsey Mocha, MD as Consulting Physician (Obstetrics and Gynecology) Kelsey Specter, MD (Unknown Physician Specialty) Kelsey Henry, Kelsey Massed, NP as Nurse Practitioner (Hematology and Oncology) OTHER MD:  CHIEF COMPLAINT: triple negative breast cancer  CURRENT TREATMENT: observation   INTERVAL HISTORY: Kelsey Henry returns today for follow-up of her triple negative breast cancer. She continues on observation.  Since her last visit, she underwent bilateral diagnostic mammography with tomography at [facility] on 02/13/2020 showing: breast density category C; no evidence of malignancy in either breast.    REVIEW OF SYSTEMS: Kelsey Henry is doing "terrific".  She goes to the gym 5 days a week and does weights and cardio.  She participated in some national bowling championship's in [place] and did very well.  She is enjoying her work.  Detailed review of systems was otherwise benign   COVID 19 VACCINATION STATUS: fully vaccinated [employer]), with booster 02/2020   HISTORY OF CURRENT ILLNESS: From the original intake note:  Kelsey Henry had routine screening mammography October 2018 showing a possible right breast mass.  She was scheduled for unilateral right mammography and tomography with right breast ultrasonography at the breast center February 06, 2017.  This showed the breast density to be category D.  In the lower inner quadrant of the right breast there was a 0.6 cm oval mass, which by ultrasound measured 0.9 cm and was irregular and hypoechoic.  Ultrasound of the right axilla  was sonographically benign.  On February 09, 2017 she underwent biopsy of the right breast mass in question, and this showed ([facility] [id number]) invasive [ductal] carcinoma, grade 3, estrogen and progesterone receptor negative, HER-2/neu not amplified, with a sickness ratio of 1.23 and the number per cell 2.9.  The patient's subsequent history is as detailed below.   PAST MEDICAL HISTORY: Past Medical History:  Diagnosis Date   Cancer [facility])    breast   Genetic testing 02/19/2017   STAT Breast panel with reflext to Multi-Cancer panel (83 genes) @ [facility] - No pathogenic mutations detected   GERD (gastroesophageal reflux disease)    no meds   Personal history of chemotherapy    2019   Personal history of radiation therapy    2019   Seasonal allergies    SVD (spontaneous vaginal delivery)    x 1    PAST SURGICAL HISTORY: Past Surgical History:  Procedure Laterality Date   ABDOMINAL HYSTERECTOMY     BILATERAL SALPINGECTOMY Bilateral 06/23/2012   Procedure: BILATERAL SALPINGECTOMY;  Surgeon: Kelsey Fowler, MD;  Location: [facility];  Service: Gynecology;  Laterality: Bilateral;   BREAST LUMPECTOMY Right 03/23/2017   BREAST LUMPECTOMY WITH RADIOACTIVE SEED AND SENTINEL LYMPH NODE BIOPSY Right 03/23/2017   Procedure: BREAST LUMPECTOMY WITH RADIOACTIVE SEED AND SENTINEL LYMPH NODE BIOPSY;  Surgeon: Kelsey Bookbinder, MD;  Location: [place];  Service: General;  Laterality: Right;   LAPAROSCOPIC SUPRACERVICAL HYSTERECTOMY N/A 06/23/2012   Procedure: LAPAROSCOPIC SUPRACERVICAL HYSTERECTOMY;  Surgeon: Kelsey Fowler, MD;  Location: [facility];  Service: Gynecology;  Laterality: N/A;   PORT-A-CATH REMOVAL N/A 08/13/2017   Procedure: REMOVAL PORT-A-CATH;  Surgeon: Kelsey Bookbinder, MD;  Location: [place];  Service: General;  Laterality: N/A;   PORTACATH PLACEMENT Right 03/23/2017   Procedure: INSERTION PORT-A-CATH;  Surgeon: Kelsey Bookbinder, MD;  Location: [facility];  Service:  General;  Laterality: Right;   TUBAL LIGATION     VULVAR LESION REMOVAL N/A 11/01/2014   Procedure: MONS PUBIS LESION;  Surgeon: Kelsey Fowler, MD;  Location: [facility];  Service: Gynecology;  Laterality: N/A;  local anesthesia with IV sedation if needed   WISDOM TOOTH EXTRACTION      FAMILY HISTORY Family History  Problem Relation Age of Onset   Hypertension Mother    Diabetes Mellitus II Mother    Hypertension Sister    Hypertension Sister    Breast cancer Neg Hx    Pancreatic cancer Neg Hx    Colon cancer Neg Hx   The patient has no information regarding her father or his side of the family.  The patient's mother is 5 years old as of November 2018.  The patient has 3 brothers, 2 sisters.  One sister had 2 "benign tumors" removed from the right breast at the age of 62.  A maternal grandfather had prostate cancer in his 57s.   GYNECOLOGIC HISTORY:  Patient's last menstrual period was 05/26/2012. Menarche age 50, first live birth age 60, the patient is GX P1.  She stopped having periods 2012 when she underwent hysterectomy, without salpingo-oophorectomy..  She used hormone replacement for approximately 3 months.  She is used oral contraceptives remotely with no complications.   SOCIAL HISTORY:  Kelsey Henry works as an Psychologist, educational for [employer], in the paternal DNA subsection.  Her husband Kelsey Henry is an Clinical biochemist. Their son Kelsey Henry is an Producer, television/film/video and lives in [place].  The patient has no grandchildren.  She is a Psychologist, forensic.    ADVANCED DIRECTIVES: Not in place   HEALTH MAINTENANCE: Social History   Tobacco Use   Smoking status: Never   Smokeless tobacco: Never  Substance Use Topics   Alcohol use: No   Drug use: No     Colonoscopy: n/a  PAP: Status post hysterectomy  Bone density: n/a   Allergies  Allergen Reactions   Aspirin Hives    Current Outpatient Medications  Medication Sig Dispense Refill   loratadine (CLARITIN) 10 MG tablet Take 10 mg by mouth daily as needed  for allergies.     Multiple Vitamins-Minerals (MULTIVITAMIN ADULT) TABS Take by mouth.     Probiotic Product (PROBIOTIC-10 PO) Take 1 capsule by mouth daily.     No current facility-administered medications for this visit.    OBJECTIVE:  African-American woman who appears younger than stated age  64:   10/02/20 0903  BP: (!) 130/92  Pulse: 72  Resp: 18  Temp: (!) 97.5 F (36.4 C)  SpO2: 100%     Body mass index is 26.23 kg/m.   Wt Readings from Last 3 Encounters:  10/02/20 134 lb 4.8 oz (60.9 kg)  10/28/19 116 lb (52.6 kg)  10/03/19 116 lb 14.4 oz (53 kg)      ECOG FS:0 - Asymptomatic  Sclerae unicteric, EOMs intact Wearing a mask No cervical or supraclavicular adenopathy Lungs no rales or rhonchi Heart regular rate and rhythm Abd soft, nontender, positive bowel sounds MSK no focal spinal tenderness, no upper extremity lymphedema Neuro: nonfocal, well oriented, appropriate affect Breasts: The right breast has undergone lumpectomy and radiation.  The cosmetic result is very good.  There is no evidence of local recurrence.  The left breast and both axillae are benign.   LAB RESULTS:  CMP     Component Value Date/Time   NA 144 10/03/2019 0915   NA 139 10/26/2018 0955   NA 142 02/18/2017 1221   K 4.9 10/03/2019 0915   K 4.6 02/18/2017 1221   CL 105 10/03/2019 0915   CO2 28 10/03/2019 0915   CO2 27 02/18/2017 1221   GLUCOSE 93 10/03/2019 0915   GLUCOSE 95 02/18/2017 1221   BUN 23 (H) 10/03/2019 0915   BUN 14 10/26/2018 0955   BUN 18.4 02/18/2017 1221   CREATININE 1.03 (H) 10/03/2019 0915   CREATININE 1.05 (H) 03/01/2018 0818   CREATININE 1.0 02/18/2017 1221   CALCIUM 10.1 10/03/2019 0915   CALCIUM 10.3 02/18/2017 1221   PROT 7.6 10/03/2019 0915   PROT 7.6 10/26/2018 0955   PROT 8.3 02/18/2017 1221   ALBUMIN 4.4 10/03/2019 0915   ALBUMIN 4.9 (H) 10/26/2018 0955   ALBUMIN 4.4 02/18/2017 1221   AST 37 10/03/2019 0915   AST 38 03/01/2018 0818   AST 22  02/18/2017 1221   ALT 39 10/03/2019 0915   ALT 34 03/01/2018 0818   ALT 18 02/18/2017 1221   ALKPHOS 129 (H) 10/03/2019 0915   ALKPHOS 89 02/18/2017 1221   BILITOT 0.5 10/03/2019 0915   BILITOT 0.4 10/26/2018 0955   BILITOT 0.3 03/01/2018 0818   BILITOT 0.48 02/18/2017 1221   GFRNONAA >60 10/03/2019 0915   GFRNONAA >60 03/01/2018 0818   GFRAA >60 10/03/2019 0915   GFRAA >60 03/01/2018 0818    Lab Results  Component Value Date   WBC 5.5 10/02/2020   NEUTROABS 2.9 10/02/2020   HGB 14.1 10/02/2020   HCT 41.3 10/02/2020   MCV 86.8 10/02/2020   PLT 218 10/02/2020      Chemistry      Component Value Date/Time   NA 144 10/03/2019 0915   NA 139 10/26/2018 0955   NA 142 02/18/2017 1221   K 4.9 10/03/2019 0915   K 4.6 02/18/2017 1221   CL 105 10/03/2019 0915   CO2 28 10/03/2019 0915   CO2 27 02/18/2017 1221   BUN 23 (H) 10/03/2019 0915   BUN 14 10/26/2018 0955   BUN 18.4 02/18/2017 1221   CREATININE 1.03 (H) 10/03/2019 0915   CREATININE 1.05 (H) 03/01/2018 0818   CREATININE 1.0 02/18/2017 1221      Component Value Date/Time   CALCIUM 10.1 10/03/2019 0915   CALCIUM 10.3 02/18/2017 1221   ALKPHOS 129 (H) 10/03/2019 0915   ALKPHOS 89 02/18/2017 1221   AST 37 10/03/2019 0915   AST 38 03/01/2018 0818   AST 22 02/18/2017 1221   ALT 39 10/03/2019 0915   ALT 34 03/01/2018 0818   ALT 18 02/18/2017 1221   BILITOT 0.5 10/03/2019 0915   BILITOT 0.4 10/26/2018 0955   BILITOT 0.3 03/01/2018 0818   BILITOT 0.48 02/18/2017 1221      No results found for: LABCA2  No components found for: GYJEHU314  No results for input(s): INR in the last 168 hours.  No results found for: LABCA2  No results found for: HFW263  No results found for: ZCH885  No results found for: OYD741  No results found for: CA2729  No components found for: HGQUANT  No results found for: CEA1 / No results found for: CEA1   No results found for: AFPTUMOR  No results found for: [place]  No  results found for: TOTALPROTELP, ALBUMINELP, A1GS, A2GS, BETS, BETA2SER, GAMS, MSPIKE, SPEI (this displays SPEP labs)  No results found for: KPAFRELGTCHN,  LAMBDASER, KAPLAMBRATIO (kappa/lambda light chains)  No results found for: HGBA, HGBA2QUANT, HGBFQUANT, HGBSQUAN (Hemoglobinopathy evaluation)   No results found for: LDH  No results found for: IRON, TIBC, IRONPCTSAT (Iron and TIBC)  No results found for: FERRITIN  Urinalysis No results found for: COLORURINE, APPEARANCEUR, LABSPEC, PHURINE, GLUCOSEU, HGBUR, BILIRUBINUR, KETONESUR, PROTEINUR, UROBILINOGEN, NITRITE, LEUKOCYTESUR   STUDIES: No results found.   ELIGIBLE FOR AVAILABLE RESEARCH PROTOCOL: UPBEAT; did not qualify for BR 003  ASSESSMENT: 55 y.o. [place] woman status post right breast lower inner quadrant biopsy February 09, 2017 for a clinical T1b N0, stage IB invasive ductal carcinoma, grade 3, triple negative  (1) status post right lumpectomy 03/23/2017 for a pT1b pN0, stage IB invasive ductal carcinoma, grade 3, with negative margins.  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 The patient then received a boost to the seroma. This delivered an additional 10 Gy in 5 fractions. The total dose was 60.4 Gy.  (4) genetics testing 02/19/2017 through the STAT Breast panel with reflext to Multi-Cancer panel (83 genes) @ [facility] - No pathogenic mutations detected in ALK, APC, ATM, AXIN2, BAP1, BARD1, BLM, BMPR1A, BRCA1, BRCA2, BRIP1, CASR, CDC73, CDH1, CDK4, CDKN1B, CDKN1C, CDKN2A, CEBPA, CHEK2, CTNNA1, DICER1, DIS3L2, EGFR, EPCAM, FH, FLCN, GATA2, GPC3, GREM1, HOXB13,  HRAS, KIT, MAX, MEN1, MET, MITF, MLH1, MSH2, MSH3, MSH6, MUTYH, NBN, NF1, NF2, NTHL1, PALB2, PDGFRA, PHOX2B, PMS2, POLD1, POLE, POT1, PRKAR1A, PTCH1, PTEN, RAD50, RAD51C, RAD51D, RB1, RECQL4, RET, RUNX1, SDHA, SDHAF2, SDHB, SDHC, SDHD, SMAD4, SMARCA4, SMARCB1, SMARCE1, STK11, SUFU, TERC, TERT, TMEM127, TP53, TSC1, TSC2, VHL, WRN, WT1.  (a) Variants of Uncertain Significance in PDGFRA c.3040G>A (p.Ala1014Thr) and SMARCA4 c.2044C>G (p.Leu682Val) noted   PLAN: Brynleigh is now 3-1/2 years out from definitive surgery for her breast cancer with no evidence of disease recurrence.  This is very favorable.  Generally if estrogen receptor negative breast cancers are to recur they tend to recur early.  For that reason I would not be uncomfortable releasing Bellam[place] from yearly follow-up here, but she likes the extra reassuring of seeing [place] so I am setting her up for survivorship.  She has her mammogram in October and she will see our survivorship nurse practitioner shortly after that.  That is pretty much 6 months after her meeting with her surgeon so that is the optimal spacing  She knows to call us for any other issue that may develop before her next visit  Total encounter time 20 minutes.*   Arya Boxley, Kelsey Dad, MD  10/02/20 9:26 AM Medical Oncology and Hematology [facility] [place] [place], [place] [address] Tel. [phone]    Fax. [phone]   I, Wilburn Mylar, am acting as scribe for Dr. Virgie Henry. Gladis Soley.  I, Lurline Del MD, have reviewed the above documentation for accuracy and completeness, and I agree with the above.   *Total Encounter Time as defined by the Centers for Medicare and Medicaid Services includes, in addition to the face-to-face time of a patient visit (documented in the note above) non-face-to-face time: obtaining and reviewing outside history, ordering and reviewing medications, tests or procedures, care coordination (communications with other  health care professionals or caregivers) and documentation in the medical  record.

## 2020-10-02 ENCOUNTER — Inpatient Hospital Stay: Payer: BC Managed Care – PPO

## 2020-10-02 ENCOUNTER — Other Ambulatory Visit: Payer: Self-pay

## 2020-10-02 ENCOUNTER — Inpatient Hospital Stay: Payer: BC Managed Care – PPO | Attending: Oncology | Admitting: Oncology

## 2020-10-02 VITALS — BP 130/92 | HR 72 | Temp 97.5°F | Resp 18 | Ht 60.0 in | Wt 134.3 lb

## 2020-10-02 DIAGNOSIS — C50311 Malignant neoplasm of lower-inner quadrant of right female breast: Secondary | ICD-10-CM

## 2020-10-02 DIAGNOSIS — Z9221 Personal history of antineoplastic chemotherapy: Secondary | ICD-10-CM | POA: Insufficient documentation

## 2020-10-02 DIAGNOSIS — Z853 Personal history of malignant neoplasm of breast: Secondary | ICD-10-CM | POA: Diagnosis present

## 2020-10-02 DIAGNOSIS — Z171 Estrogen receptor negative status [ER-]: Secondary | ICD-10-CM | POA: Diagnosis not present

## 2020-10-02 DIAGNOSIS — Z923 Personal history of irradiation: Secondary | ICD-10-CM | POA: Insufficient documentation

## 2020-10-02 LAB — CBC WITH DIFFERENTIAL/PLATELET
Abs Immature Granulocytes: 0 10*3/uL (ref 0.00–0.07)
Basophils Absolute: 0 10*3/uL (ref 0.0–0.1)
Basophils Relative: 1 %
Eosinophils Absolute: 0.2 10*3/uL (ref 0.0–0.5)
Eosinophils Relative: 3 %
HCT: 41.3 % (ref 36.0–46.0)
Hemoglobin: 14.1 g/dL (ref 12.0–15.0)
Immature Granulocytes: 0 %
Lymphocytes Relative: 35 %
Lymphs Abs: 1.9 10*3/uL (ref 0.7–4.0)
MCH: 29.6 pg (ref 26.0–34.0)
MCHC: 34.1 g/dL (ref 30.0–36.0)
MCV: 86.8 fL (ref 80.0–100.0)
Monocytes Absolute: 0.5 10*3/uL (ref 0.1–1.0)
Monocytes Relative: 9 %
Neutro Abs: 2.9 10*3/uL (ref 1.7–7.7)
Neutrophils Relative %: 52 %
Platelets: 218 10*3/uL (ref 150–400)
RBC: 4.76 MIL/uL (ref 3.87–5.11)
RDW: 11.8 % (ref 11.5–15.5)
WBC: 5.5 10*3/uL (ref 4.0–10.5)
nRBC: 0 % (ref 0.0–0.2)

## 2020-10-02 LAB — CMP (CANCER CENTER ONLY)
ALT: 28 U/L (ref 0–44)
AST: 35 U/L (ref 15–41)
Albumin: 4.6 g/dL (ref 3.5–5.0)
Alkaline Phosphatase: 90 U/L (ref 38–126)
Anion gap: 7 (ref 5–15)
BUN: 25 mg/dL — ABNORMAL HIGH (ref 6–20)
CO2: 28 mmol/L (ref 22–32)
Calcium: 9.6 mg/dL (ref 8.9–10.3)
Chloride: 102 mmol/L (ref 98–111)
Creatinine: 1 mg/dL (ref 0.44–1.00)
GFR, Estimated: >60 mL/min (ref >60)
Glucose, Bld: 85 mg/dL (ref 70–99)
Potassium: 4.3 mmol/L (ref 3.5–5.1)
Sodium: 137 mmol/L (ref 135–145)
Total Bilirubin: 0.6 mg/dL (ref 0.3–1.2)
Total Protein: 7.5 g/dL (ref 6.5–8.1)

## 2020-10-09 ENCOUNTER — Telehealth: Payer: Self-pay | Admitting: Adult Health

## 2020-10-09 NOTE — Telephone Encounter (Signed)
R/s appt per 6/28 sch msg. Pt aware.

## 2020-10-29 ENCOUNTER — Encounter: Payer: BC Managed Care – PPO | Admitting: Family

## 2020-10-30 ENCOUNTER — Ambulatory Visit: Payer: BC Managed Care – PPO | Attending: Internal Medicine

## 2020-10-30 ENCOUNTER — Other Ambulatory Visit: Payer: Self-pay

## 2020-10-30 ENCOUNTER — Encounter: Payer: Self-pay | Admitting: Family

## 2020-10-30 ENCOUNTER — Ambulatory Visit (INDEPENDENT_AMBULATORY_CARE_PROVIDER_SITE_OTHER): Payer: BC Managed Care – PPO | Admitting: Family

## 2020-10-30 VITALS — BP 116/80 | HR 80 | Temp 98.1°F | Resp 16 | Ht 60.0 in | Wt 133.0 lb

## 2020-10-30 DIAGNOSIS — E785 Hyperlipidemia, unspecified: Secondary | ICD-10-CM

## 2020-10-30 DIAGNOSIS — Z0000 Encounter for general adult medical examination without abnormal findings: Secondary | ICD-10-CM

## 2020-10-30 DIAGNOSIS — Z23 Encounter for immunization: Secondary | ICD-10-CM

## 2020-10-30 NOTE — Patient Instructions (Signed)
Please complete lab work prior to leaving.   

## 2020-10-30 NOTE — Assessment & Plan Note (Signed)
Encouraged pt to continue healthy diet, regular exercise.  Recommended covid booster #2.  She will follow up with GYN. Mammo up to date. Dental/Vision up to date.

## 2020-10-30 NOTE — Progress Notes (Signed)
   Covid-19 Vaccination Clinic  Name:  Kelsey Henry    MRN: [id number] DOB: 03/06/1966  10/30/2020  [name] was observed post Covid-19 immunization for 15 minutes without incident. She was provided with Vaccine Information Sheet and instruction to access the V-Safe system.   [name] was instructed to call 911 with any severe reactions post vaccine: Difficulty breathing  Swelling of face and throat  A fast heartbeat  A bad rash all over body  Dizziness and weakness   Immunizations Administered     Name Date Dose VIS Date Route   PFIZER Comrnaty(Gray TOP) Covid-19 Vaccine 10/30/2020  7:50 AM 0.3 mL 03/22/2020 Intramuscular   Manufacturer: [place]   Lot: [id number]   [place]: [phone]

## 2020-10-30 NOTE — Progress Notes (Signed)
Subjective:    Patient ID: Kelsey Henry, female    DOB: 28-Nov-1965, 55 y.o.   MRN: [id number]  HPI  Patient presents today for complete physical.  Immunizations: shingrix #2 due, [place] #4.  Diet: Reports diet is healthy Wt Readings from Last 3 Encounters:  10/30/20 133 lb (60.3 kg)  10/02/20 134 lb 4.8 oz (60.9 kg)  10/28/19 116 lb (52.6 kg)  Exercise: 5 x a week- enjoys cardio and weight training.  Colonoscopy: 11/08/18 Pap Smear: 01/12/18- sees Dr. Willis Modena (s/p hysterectomy) Mammogram:02/13/20 Vision: up to date Dental: up to date   Review of Systems  Constitutional:  Negative for unexpected weight change.  HENT:  Negative for rhinorrhea.   Respiratory:  Negative for cough and shortness of breath.   Cardiovascular:  Negative for leg swelling.  Gastrointestinal:  Negative for blood in stool, constipation and diarrhea.  Genitourinary:  Negative for dysuria, frequency and hematuria.  Musculoskeletal:  Negative for arthralgias and myalgias.  Skin:  Negative for rash.  Neurological:  Negative for headaches.  Hematological:  Negative for adenopathy.  Psychiatric/Behavioral:         Denies depression/anxiety.      Past Medical History:  Diagnosis Date   Cancer [facility])    breast   Genetic testing 02/19/2017   STAT Breast panel with reflext to Multi-Cancer panel (83 genes) @ [facility] - No pathogenic mutations detected   GERD (gastroesophageal reflux disease)    no meds   Personal history of chemotherapy    2019   Personal history of radiation therapy    2019   Seasonal allergies    SVD (spontaneous vaginal delivery)    x 1     Social History   Socioeconomic History   Marital status: Married    Spouse name: Not on file   Number of children: Not on file   Years of education: Not on file   Highest education level: Not on file  Occupational History   Not on file  Tobacco Use   Smoking status: Never   Smokeless tobacco: Never  Substance and Sexual  Activity   Alcohol use: No   Drug use: No   Sexual activity: Yes    Partners: Male    Birth control/protection: Surgical  Other Topics Concern   Not on file  Social History Narrative   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)   One son who lives in [place]   Married   One dog (Diamond) [place]   Enjoys, Materials engineer, reading, bowling (has her own league) [employer]   Part time hair stylist   Social Determinants of Health   Financial Resource Strain: Not on file  Food Insecurity: Not on file  Transportation Needs: Not on file  Physical Activity: Not on file  Stress: Not on file  Social Connections: Not on file  Intimate Partner Violence: Not on file    Past Surgical History:  Procedure Laterality Date   ABDOMINAL HYSTERECTOMY  2000   BILATERAL SALPINGECTOMY Bilateral 06/23/2012   Procedure: BILATERAL SALPINGECTOMY;  Surgeon: Cheri Fowler, MD;  Location: [facility];  Service: Gynecology;  Laterality: Bilateral;   BREAST LUMPECTOMY Right 03/23/2017   BREAST LUMPECTOMY WITH RADIOACTIVE SEED AND SENTINEL LYMPH NODE BIOPSY Right 03/23/2017   Procedure: BREAST LUMPECTOMY WITH RADIOACTIVE SEED AND SENTINEL LYMPH NODE BIOPSY;  Surgeon: Rolm Bookbinder, MD;  Location: [facility];  Service: General;  Laterality: Right;   LAPAROSCOPIC SUPRACERVICAL HYSTERECTOMY N/A 06/23/2012   Procedure: LAPAROSCOPIC SUPRACERVICAL HYSTERECTOMY;  Surgeon: Cheri Fowler, MD;  Location: [facility];  Service: Gynecology;  Laterality: N/A;   PORT-A-CATH REMOVAL N/A 08/13/2017   Procedure: REMOVAL PORT-A-CATH;  Surgeon: Rolm Bookbinder, MD;  Location: [facility];  Service: General;  Laterality: N/A;   PORTACATH PLACEMENT Right 03/23/2017   Procedure: INSERTION PORT-A-CATH;  Surgeon: Rolm Bookbinder, MD;  Location: [facility];  Service: General;  Laterality: Right;   TUBAL LIGATION     VULVAR LESION REMOVAL N/A 11/01/2014   Procedure: MONS PUBIS LESION;  Surgeon: Cheri Fowler,  MD;  Location: [facility];  Service: Gynecology;  Laterality: N/A;  local anesthesia with IV sedation if needed   WISDOM TOOTH EXTRACTION      Family History  Problem Relation Age of Onset   Hypertension Mother    Diabetes Mellitus II Mother    Breast cancer Mother        stage 74, s/p mastectomy   Hypertension Sister    Hypertension Sister    Diabetes type II Maternal Grandmother    Hypertension Maternal Grandmother    Pancreatic cancer Neg Hx    Colon cancer Neg Hx     Allergies  Allergen Reactions   Aspirin Hives    Current Outpatient Medications on File Prior to Visit  Medication Sig Dispense Refill   Biotin 1 MG CAPS Take by mouth. 30 capsule    loratadine (CLARITIN) 10 MG tablet Take 10 mg by mouth daily as needed for allergies.     Multiple Vitamins-Minerals (MULTIVITAMIN ADULT) TABS Take by mouth.     Probiotic Product (PROBIOTIC-10 PO) Take 1 capsule by mouth daily.     No current facility-administered medications on file prior to visit.    BP 116/80 (BP Location: Left Arm, Patient Position: Sitting, Cuff Size: Normal)   Pulse 80   Temp 98.1 F (36.7 C)   Resp 16   Ht 5' (1.524 m)   Wt 133 lb (60.3 kg)   LMP 05/26/2012   SpO2 98%   BMI 25.97 kg/m    Objective:   Physical Exam  Physical Exam  Constitutional: She is oriented to person, place, and time. She appears well-developed and well-nourished. No distress.  HENT:  Head: Normocephalic and atraumatic.  Right Ear: Tympanic membrane and ear canal normal.  Left Ear: Tympanic membrane and ear canal normal.  Mouth/Throat: Not examined- pt wearing mask Eyes: Pupils are equal, round, and reactive to light. No scleral icterus.  Neck: Normal range of motion. No thyromegaly present.  Cardiovascular: Normal rate and regular rhythm.   No murmur heard. Pulmonary/Chest: Effort normal and breath sounds normal. No respiratory distress. He has no wheezes. She has no rales. She exhibits no tenderness.  Abdominal: Soft.  Bowel sounds are normal. She exhibits no distension and no mass. There is no tenderness. There is no rebound and no guarding.  Musculoskeletal: She exhibits no edema.  Lymphadenopathy:    She has no cervical adenopathy.  Neurological: She is alert and oriented to person, place, and time. She has normal patellar reflexes. She exhibits normal muscle tone. Coordination normal.  Skin: Skin is warm and dry.  Psychiatric: She has a normal mood and affect. Her behavior is normal. Judgment and thought content normal.  Breast/pelvic:  deferred          Assessment & Plan:         Assessment & Plan:

## 2020-10-31 ENCOUNTER — Encounter: Payer: Self-pay | Admitting: Family

## 2020-10-31 LAB — LIPID PANEL
Chol/HDL Ratio: 2.8 ratio (ref 0.0–4.4)
Cholesterol, Total: 218 mg/dL — ABNORMAL HIGH (ref 100–199)
HDL: 79 mg/dL (ref >39)
LDL Chol Calc (NIH): 126 mg/dL — ABNORMAL HIGH (ref 0–99)
Triglycerides: 75 mg/dL (ref 0–149)
VLDL Cholesterol Cal: 13 mg/dL (ref 5–40)

## 2020-11-05 ENCOUNTER — Encounter: Payer: Self-pay | Admitting: Oncology

## 2020-11-05 ENCOUNTER — Other Ambulatory Visit (HOSPITAL_BASED_OUTPATIENT_CLINIC_OR_DEPARTMENT_OTHER): Payer: Self-pay

## 2020-11-05 MED ORDER — COVID-19 MRNA VAC-TRIS(PFIZER) 30 MCG/0.3ML IM SUSP
INTRAMUSCULAR | 0 refills | Status: DC
  Filled 2020-11-05: qty 0.3, 1d supply, fill #0

## 2020-11-07 NOTE — Progress Notes (Signed)
Mailed out to patient 

## 2020-11-21 ENCOUNTER — Other Ambulatory Visit: Payer: Self-pay | Admitting: Adult Health

## 2020-11-21 DIAGNOSIS — C50311 Malignant neoplasm of lower-inner quadrant of right female breast: Secondary | ICD-10-CM

## 2020-11-21 DIAGNOSIS — Z171 Estrogen receptor negative status [ER-]: Secondary | ICD-10-CM

## 2020-12-26 ENCOUNTER — Telehealth: Payer: Self-pay | Admitting: Adult Health

## 2020-12-26 NOTE — Telephone Encounter (Signed)
R/s 11/4 appt per sch msg. Called and spoke with patient. Confirmed new date and time

## 2021-01-14 ENCOUNTER — Ambulatory Visit: Payer: BC Managed Care – PPO

## 2021-01-25 ENCOUNTER — Encounter: Payer: BC Managed Care – PPO | Admitting: Adult Health

## 2021-02-08 DIAGNOSIS — Z130 Encounter for screening for diseases of the blood and blood-forming organs and certain disorders involving the immune mechanism: Secondary | ICD-10-CM | POA: Diagnosis not present

## 2021-02-08 DIAGNOSIS — Z01419 Encounter for gynecological examination (general) (routine) without abnormal findings: Secondary | ICD-10-CM | POA: Diagnosis not present

## 2021-02-08 DIAGNOSIS — Z6826 Body mass index (BMI) 26.0-26.9, adult: Secondary | ICD-10-CM | POA: Diagnosis not present

## 2021-02-08 DIAGNOSIS — R82998 Other abnormal findings in urine: Secondary | ICD-10-CM | POA: Diagnosis not present

## 2021-02-13 ENCOUNTER — Ambulatory Visit
Admission: RE | Admit: 2021-02-13 | Discharge: 2021-02-13 | Disposition: A | Discharge status: Home / Self Care | Payer: BC Managed Care – PPO | Source: Ambulatory Visit | Attending: Oncology | Admitting: Oncology

## 2021-02-13 ENCOUNTER — Other Ambulatory Visit: Payer: Self-pay | Admitting: Oncology

## 2021-02-13 ENCOUNTER — Other Ambulatory Visit: Payer: Self-pay

## 2021-02-13 DIAGNOSIS — Z171 Estrogen receptor negative status [ER-]: Secondary | ICD-10-CM

## 2021-02-13 DIAGNOSIS — C50311 Malignant neoplasm of lower-inner quadrant of right female breast: Secondary | ICD-10-CM

## 2021-02-13 DIAGNOSIS — Z1231 Encounter for screening mammogram for malignant neoplasm of breast: Secondary | ICD-10-CM | POA: Diagnosis not present

## 2021-02-15 ENCOUNTER — Encounter: Payer: BC Managed Care – PPO | Admitting: Adult Health

## 2021-02-18 ENCOUNTER — Other Ambulatory Visit: Payer: Self-pay

## 2021-02-18 ENCOUNTER — Inpatient Hospital Stay: Payer: BC Managed Care – PPO | Attending: Adult Health | Admitting: Adult Health

## 2021-02-18 ENCOUNTER — Encounter: Payer: Self-pay | Admitting: Adult Health

## 2021-02-18 ENCOUNTER — Ambulatory Visit: Payer: BC Managed Care – PPO

## 2021-02-18 VITALS — BP 150/93 | HR 87 | Temp 97.5°F | Resp 18 | Ht 60.0 in | Wt 134.3 lb

## 2021-02-18 DIAGNOSIS — Z23 Encounter for immunization: Secondary | ICD-10-CM

## 2021-02-18 DIAGNOSIS — Z9189 Other specified personal risk factors, not elsewhere classified: Secondary | ICD-10-CM

## 2021-02-18 DIAGNOSIS — C50311 Malignant neoplasm of lower-inner quadrant of right female breast: Secondary | ICD-10-CM | POA: Diagnosis not present

## 2021-02-18 DIAGNOSIS — Z171 Estrogen receptor negative status [ER-]: Secondary | ICD-10-CM | POA: Insufficient documentation

## 2021-02-18 DIAGNOSIS — Z803 Family history of malignant neoplasm of breast: Secondary | ICD-10-CM | POA: Insufficient documentation

## 2021-02-18 MED ORDER — PNEUMOCOCCAL 20-VAL CONJ VACC 0.5 ML IM SUSY
0.5000 mL | PREFILLED_SYRINGE | Freq: Once | INTRAMUSCULAR | Status: AC
  Administered 2021-02-18: 0.5 mL via INTRAMUSCULAR
  Filled 2021-02-18: qty 0.5

## 2021-02-18 NOTE — Patient Instructions (Signed)
Pneumococcal Conjugate Vaccine (Prevnar 13) Suspension for Injection What is this medication? PNEUMOCOCCAL VACCINE (NEU mo KOK al vak SEEN) is a vaccine used to prevent pneumococcus bacterial infections. These bacteria can cause serious infections like pneumonia, meningitis, and blood infections. This vaccine will lower your chance of getting pneumonia. If you do get pneumonia, it can make your symptoms milder and your illness shorter. This vaccine will not treat an infection and will not cause infection. This vaccine is recommended for infants and young children, adults with certain medical conditions, and adults 40 years or older. This medicine may be used for other purposes; ask your health care provider or pharmacist if you have questions. COMMON BRAND NAME(S): Prevnar, Prevnar 13 What should I tell my care team before I take this medication? They need to know if you have any of these conditions: bleeding problems fever immune system problems an unusual or allergic reaction to pneumococcal vaccine, diphtheria toxoid, other vaccines, latex, other medicines, foods, dyes, or preservatives pregnant or trying to get pregnant breast-feeding How should I use this medication? This vaccine is for injection into a muscle. It is given by a health care professional. A copy of Vaccine Information Statements will be given before each vaccination. Read this sheet carefully each time. The sheet may change frequently. Talk to your pediatrician regarding the use of this medicine in children. While this drug may be prescribed for children as young as 28 weeks old for selected conditions, precautions do apply. Overdosage: If you think you have taken too much of this medicine contact a poison control center or emergency room at once. NOTE: This medicine is only for you. Do not share this medicine with others. What if I miss a dose? It is important not to miss your dose. Call your doctor or health care professional  if you are unable to keep an appointment. What may interact with this medication? medicines for cancer chemotherapy medicines that suppress your immune function steroid medicines like prednisone or cortisone This list may not describe all possible interactions. Give your health care provider a list of all the medicines, herbs, non-prescription drugs, or dietary supplements you use. Also tell them if you smoke, drink alcohol, or use illegal drugs. Some items may interact with your medicine. What should I watch for while using this medication? Mild fever and pain should go away in 3 days or less. Report any unusual symptoms to your doctor or health care professional. What side effects may I notice from receiving this medication? Side effects that you should report to your doctor or health care professional as soon as possible: allergic reactions like skin rash, itching or hives, swelling of the face, lips, or tongue breathing problems confused fast or irregular heartbeat fever over 102 degrees F seizures unusual bleeding or bruising unusual muscle weakness Side effects that usually do not require medical attention (report to your doctor or health care professional if they continue or are bothersome): aches and pains diarrhea fever of 102 degrees F or less headache irritable loss of appetite pain, tender at site where injected trouble sleeping This list may not describe all possible side effects. Call your doctor for medical advice about side effects. You may report side effects to FDA at 1-800-FDA-1088. Where should I keep my medication? This does not apply. This vaccine is given in a clinic, pharmacy, doctor's office, or other health care setting and will not be stored at home. NOTE: This sheet is a summary. It may not cover all possible  information. If you have questions about this medicine, talk to your doctor, pharmacist, or health care provider.  2022 Elsevier/Gold Standard  (2014-01-05 00:00:00)

## 2021-02-18 NOTE — Progress Notes (Signed)
[facility]  [phone] [phone]     ID: Kelsey Henry DOB: 1965-10-05  MR#: [id number]  TUU#:[id number]  Patient Care Team: Debbrah Alar, NP as PCP - General (Internal Medicine) Magrinat, Virgie Dad, MD as Consulting Physician (Oncology) Kyung Rudd, MD as Consulting Physician (Radiation Oncology) Rolm Bookbinder, MD as Consulting Physician (General Surgery) Meisinger, Sherren Mocha, MD as Consulting Physician (Obstetrics and Gynecology) Loletta Specter, MD (Unknown Physician Specialty) Delice Bison, Charlestine Massed, NP as Nurse Practitioner (Hematology and Oncology) OTHER MD:  CHIEF COMPLAINT: triple negative breast cancer  CURRENT TREATMENT: observation   INTERVAL HISTORY: Kelsey Henry returns today for follow-up of her triple negative breast cancer. She continues on observation.  Since her last visit, she underwent bilateral screening mammogram.  There was no mammographic evidence of malignancy.  She had breast density category C.  She remains active by exercising 5 times a week.  She also is bowling and league and travels to bowl.  Her top score is 289.  Overall she is feeling well and has no concerns today.  She does note that she has never received a pneumonia vaccine.   REVIEW OF SYSTEMS: Review of Systems  Constitutional:  Negative for appetite change, chills, fatigue, fever and unexpected weight change.  HENT:   Negative for hearing loss, lump/mass and trouble swallowing.   Eyes:  Negative for eye problems and icterus.  Respiratory:  Negative for chest tightness, cough and shortness of breath.   Cardiovascular:  Negative for chest pain, leg swelling and palpitations.  Gastrointestinal:  Negative for abdominal distention, abdominal pain, constipation, diarrhea, nausea and vomiting.  Endocrine: Negative for hot flashes.  Genitourinary:  Negative for difficulty urinating.   Musculoskeletal:  Negative for arthralgias.  Skin:  Negative  for itching and rash.  Neurological:  Negative for dizziness, extremity weakness, headaches and numbness.  Hematological:  Negative for adenopathy. Does not bruise/bleed easily.  Psychiatric/Behavioral:  Negative for depression. The patient is not nervous/anxious.      COVID 19 VACCINATION STATUS: fully vaccinated [employer]), with booster 02/2020   HISTORY OF CURRENT ILLNESS: From the original intake note:  Kelsey Henry had routine screening mammography October 2018 showing a possible right breast mass.  She was scheduled for unilateral right mammography and tomography with right breast ultrasonography at the breast center February 06, 2017.  This showed the breast density to be category D.  In the lower inner quadrant of the right breast there was a 0.6 cm oval mass, which by ultrasound measured 0.9 cm and was irregular and hypoechoic.  Ultrasound of the right axilla was sonographically benign.  On February 09, 2017 she underwent biopsy of the right breast mass in question, and this showed ([facility] [phone]) invasive [ductal] carcinoma, grade 3, estrogen and progesterone receptor negative, HER-2/neu not amplified, with a sickness ratio of 1.23 and the number per cell 2.9.  The patient's subsequent history is as detailed below.   PAST MEDICAL HISTORY: Past Medical History:  Diagnosis Date   Cancer [facility])    breast   Genetic testing 02/19/2017   STAT Breast panel with reflext to Multi-Cancer panel (83 genes) @ [facility] - No pathogenic mutations detected   GERD (gastroesophageal reflux disease)    no meds   Personal history of chemotherapy    2019   Personal history of radiation therapy    2019   Seasonal allergies    SVD (spontaneous vaginal delivery)    x 1    PAST SURGICAL HISTORY: Past Surgical History:  Procedure  Laterality Date   ABDOMINAL HYSTERECTOMY  2000   BILATERAL SALPINGECTOMY Bilateral 06/23/2012   Procedure: BILATERAL SALPINGECTOMY;  Surgeon: Cheri Fowler, MD;  Location:  [facility];  Service: Gynecology;  Laterality: Bilateral;   BREAST LUMPECTOMY Right 03/23/2017   BREAST LUMPECTOMY WITH RADIOACTIVE SEED AND SENTINEL LYMPH NODE BIOPSY Right 03/23/2017   Procedure: BREAST LUMPECTOMY WITH RADIOACTIVE SEED AND SENTINEL LYMPH NODE BIOPSY;  Surgeon: Rolm Bookbinder, MD;  Location: [facility];  Service: General;  Laterality: Right;   LAPAROSCOPIC SUPRACERVICAL HYSTERECTOMY N/A 06/23/2012   Procedure: LAPAROSCOPIC SUPRACERVICAL HYSTERECTOMY;  Surgeon: Cheri Fowler, MD;  Location: [facility];  Service: Gynecology;  Laterality: N/A;   PORT-A-CATH REMOVAL N/A 08/13/2017   Procedure: REMOVAL PORT-A-CATH;  Surgeon: Rolm Bookbinder, MD;  Location: [facility];  Service: General;  Laterality: N/A;   PORTACATH PLACEMENT Right 03/23/2017   Procedure: INSERTION PORT-A-CATH;  Surgeon: Rolm Bookbinder, MD;  Location: [facility];  Service: General;  Laterality: Right;   TUBAL LIGATION     VULVAR LESION REMOVAL N/A 11/01/2014   Procedure: MONS PUBIS LESION;  Surgeon: Cheri Fowler, MD;  Location: [facility];  Service: Gynecology;  Laterality: N/A;  local anesthesia with IV sedation if needed   WISDOM TOOTH EXTRACTION      FAMILY HISTORY Family History  Problem Relation Age of Onset   Hypertension Mother    Diabetes Mellitus II Mother    Breast cancer Mother        stage 47, s/p mastectomy   Hypertension Sister    Hypertension Sister    Diabetes type II Maternal Grandmother    Hypertension Maternal Grandmother    Pancreatic cancer Neg Hx    Colon cancer Neg Hx   The patient has no information regarding her father or his side of the family.  The patient's mother is 72 years old as of November 2018.  The patient has 3 brothers, 2 sisters.  One sister had 2 "benign tumors" removed from the right breast at the age of 31.  A maternal grandfather had prostate cancer in his 84s.   GYNECOLOGIC HISTORY:  Patient's last menstrual period was 05/26/2012. Menarche age 56, first  live birth age 45, the patient is GX P1.  She stopped having periods 2012 when she underwent hysterectomy, without salpingo-oophorectomy..  She used hormone replacement for approximately 3 months.  She is used oral contraceptives remotely with no complications.   SOCIAL HISTORY:  Kelsey Henry works as an Psychologist, educational for [employer], in the paternal DNA subsection.  Her husband Lennette Henry is an Clinical biochemist. Their son [name] is an Producer, television/film/video and lives in [place].  The patient has no grandchildren.  She is a Psychologist, forensic.    ADVANCED DIRECTIVES: Not in place   HEALTH MAINTENANCE: Social History   Tobacco Use   Smoking status: Never   Smokeless tobacco: Never  Substance Use Topics   Alcohol use: No   Drug use: No     Colonoscopy: n/a  PAP: Status post hysterectomy  Bone density: n/a   Allergies  Allergen Reactions   Aspirin Hives    Current Outpatient Medications  Medication Sig Dispense Refill   cholecalciferol (VITAMIN D3) 25 MCG (1000 UNIT) tablet Take 1,000 Units by mouth daily.     Biotin 1 MG CAPS Take by mouth. 30 capsule    COVID-19 mRNA Vac-TriS, Pfizer, SUSP injection Inject into the muscle. 0.3 mL 0   loratadine (CLARITIN) 10 MG tablet Take 10 mg by mouth daily as needed for allergies.  Multiple Vitamins-Minerals (MULTIVITAMIN ADULT) TABS Take by mouth.     Probiotic Product (PROBIOTIC-10 PO) Take 1 capsule by mouth daily.     No current facility-administered medications for this visit.    OBJECTIVE:  African-American woman who appears younger than stated age  26:   02/18/21 1215  BP: (!) 150/93  Pulse: 87  Resp: 18  Temp: (!) 97.5 F (36.4 C)  SpO2: 97%      Body mass index is 26.23 kg/m.   Wt Readings from Last 3 Encounters:  02/18/21 134 lb 4.8 oz (60.9 kg)  10/30/20 133 lb (60.3 kg)  10/02/20 134 lb 4.8 oz (60.9 kg)      ECOG FS:0 - Asymptomatic GENERAL: Patient is a well appearing female in no acute distress HEENT:  Sclerae anicteric.  Oropharynx  clear and moist. No ulcerations or evidence of oropharyngeal candidiasis. Neck is supple.  NODES:  No cervical, supraclavicular, or axillary lymphadenopathy palpated.  BREAST EXAM: Right breast is status postlumpectomy and radiation no sign of recurrence.  Left breast is benign. LUNGS:  Clear to auscultation bilaterally.  No wheezes or rhonchi. HEART:  Regular rate and rhythm. No murmur appreciated. ABDOMEN:  Soft, nontender.  Positive, normoactive bowel sounds. No organomegaly palpated. MSK:  No focal spinal tenderness to palpation. Full range of motion bilaterally in the upper extremities. EXTREMITIES:  No peripheral edema.   SKIN:  Clear with no obvious rashes or skin changes. No nail dyscrasia. NEURO:  Nonfocal. Well oriented.  Appropriate affect.    LAB RESULTS:  CMP     Component Value Date/Time   NA 137 10/02/2020 0805   NA 139 10/26/2018 0955   NA 142 02/18/2017 1221   K 4.3 10/02/2020 0805   K 4.6 02/18/2017 1221   CL 102 10/02/2020 0805   CO2 28 10/02/2020 0805   CO2 27 02/18/2017 1221   GLUCOSE 85 10/02/2020 0805   GLUCOSE 95 02/18/2017 1221   BUN 25 (H) 10/02/2020 0805   BUN 14 10/26/2018 0955   BUN 18.4 02/18/2017 1221   CREATININE 1.00 10/02/2020 0805   CREATININE 1.0 02/18/2017 1221   CALCIUM 9.6 10/02/2020 0805   CALCIUM 10.3 02/18/2017 1221   PROT 7.5 10/02/2020 0805   PROT 7.6 10/26/2018 0955   PROT 8.3 02/18/2017 1221   ALBUMIN 4.6 10/02/2020 0805   ALBUMIN 4.9 (H) 10/26/2018 0955   ALBUMIN 4.4 02/18/2017 1221   AST 35 10/02/2020 0805   AST 22 02/18/2017 1221   ALT 28 10/02/2020 0805   ALT 18 02/18/2017 1221   ALKPHOS 90 10/02/2020 0805   ALKPHOS 89 02/18/2017 1221   BILITOT 0.6 10/02/2020 0805   BILITOT 0.48 02/18/2017 1221   GFRNONAA >60 10/02/2020 0805   GFRAA >60 10/03/2019 0915   GFRAA >60 03/01/2018 0818    Lab Results  Component Value Date   WBC 5.5 10/02/2020   NEUTROABS 2.9 10/02/2020   HGB 14.1 10/02/2020   HCT 41.3 10/02/2020    MCV 86.8 10/02/2020   PLT 218 10/02/2020      Chemistry      Component Value Date/Time   NA 137 10/02/2020 0805   NA 139 10/26/2018 0955   NA 142 02/18/2017 1221   K 4.3 10/02/2020 0805   K 4.6 02/18/2017 1221   CL 102 10/02/2020 0805   CO2 28 10/02/2020 0805   CO2 27 02/18/2017 1221   BUN 25 (H) 10/02/2020 0805   BUN 14 10/26/2018 0955   BUN 18.4 02/18/2017 1221  CREATININE 1.00 10/02/2020 0805   CREATININE 1.0 02/18/2017 1221      Component Value Date/Time   CALCIUM 9.6 10/02/2020 0805   CALCIUM 10.3 02/18/2017 1221   ALKPHOS 90 10/02/2020 0805   ALKPHOS 89 02/18/2017 1221   AST 35 10/02/2020 0805   AST 22 02/18/2017 1221   ALT 28 10/02/2020 0805   ALT 18 02/18/2017 1221   BILITOT 0.6 10/02/2020 0805   BILITOT 0.48 02/18/2017 1221      No results found for: LABCA2  No components found for: OFBPZW258  No results for input(s): INR in the last 168 hours.  No results found for: LABCA2  No results found for: NID782  No results found for: UMP536  No results found for: RWE315  No results found for: CA2729  No components found for: HGQUANT  No results found for: CEA1 / No results found for: CEA1   No results found for: AFPTUMOR  No results found for: CHROMOGRNA  No results found for: TOTALPROTELP, ALBUMINELP, A1GS, A2GS, BETS, BETA2SER, GAMS, MSPIKE, SPEI (this displays SPEP labs)  No results found for: KPAFRELGTCHN, LAMBDASER, KAPLAMBRATIO (kappa/lambda light chains)  No results found for: HGBA, HGBA2QUANT, HGBFQUANT, HGBSQUAN (Hemoglobinopathy evaluation)   No results found for: LDH  No results found for: IRON, TIBC, IRONPCTSAT (Iron and TIBC)  No results found for: FERRITIN  Urinalysis No results found for: COLORURINE, APPEARANCEUR, LABSPEC, PHURINE, GLUCOSEU, HGBUR, BILIRUBINUR, KETONESUR, PROTEINUR, UROBILINOGEN, NITRITE, LEUKOCYTESUR   STUDIES: MM 3D SCREEN BREAST BILATERAL  Result Date: 02/13/2021 CLINICAL DATA:  Screening. EXAM:  DIGITAL SCREENING BILATERAL MAMMOGRAM WITH TOMOSYNTHESIS AND CAD TECHNIQUE: Bilateral screening digital craniocaudal and mediolateral oblique mammograms were obtained. Bilateral screening digital breast tomosynthesis was performed. The images were evaluated with computer-aided detection. COMPARISON:  Previous exam(s). ACR Breast Density Category c: The breast tissue is heterogeneously dense, which may obscure small masses. FINDINGS: There are no findings suspicious for malignancy. IMPRESSION: No mammographic evidence of malignancy. A result letter of this screening mammogram will be mailed directly to the patient. RECOMMENDATION: Screening mammogram in one year. (Code:SM-B-01Y) BI-RADS CATEGORY  1: Negative. Electronically Signed   By: Ammie Ferrier M.D.   On: 02/13/2021 14:15     ELIGIBLE FOR AVAILABLE RESEARCH PROTOCOL: UPBEAT; did not qualify for BR 003  ASSESSMENT: 55 y.o. [place] woman status post right breast lower inner quadrant biopsy February 09, 2017 for a clinical T1b N0, stage IB invasive ductal carcinoma, grade 3, triple negative  (1) status post right lumpectomy 03/23/2017 for a pT1b pN0, stage IB invasive ductal carcinoma, grade 3, with negative margins.  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 The patient then received a boost to the seroma. This delivered an additional 10 Gy in 5 fractions. The total dose was 60.4 Gy.  (4) genetics testing 02/19/2017 through the STAT Breast panel with reflext to Multi-Cancer panel (83 genes) @ [facility] - No pathogenic mutations detected in ALK, APC, ATM, AXIN2, BAP1, BARD1, BLM, BMPR1A,  BRCA1, BRCA2, BRIP1, CASR, CDC73, CDH1, CDK4, CDKN1B, CDKN1C, CDKN2A, CEBPA, CHEK2, CTNNA1, DICER1, DIS3L2, EGFR, EPCAM, FH, FLCN, GATA2, GPC3, GREM1, HOXB13, HRAS, KIT, MAX, MEN1, MET, MITF, MLH1, MSH2, MSH3, MSH6, MUTYH, NBN, NF1, NF2, NTHL1, PALB2, PDGFRA, PHOX2B,  PMS2, POLD1, POLE, POT1, PRKAR1A, PTCH1, PTEN, RAD50, RAD51C, RAD51D, RB1, RECQL4, RET, RUNX1, SDHA, SDHAF2, SDHB, SDHC, SDHD, SMAD4, SMARCA4, SMARCB1, SMARCE1, STK11, SUFU, TERC, TERT, TMEM127, TP53, TSC1, TSC2, VHL, WRN, WT1.  (a) Variants of Uncertain Significance in PDGFRA c.3040G>A (p.Ala1014Thr) and SMARCA4 c.2044C>G (p.Leu682Val) noted   PLAN: Verenice is nearly 4 years out from her breast cancer diagnosis with no sign of recurrence.  This is favorable.  She is doing quite well.  She is due for a mammogram again in November 2023.  She would like for this to be diagnostic, she notes that it was not this past time and it made her very anxious.  We updated her health maintenance today.  She is due for pneumonia vaccine.  She will receive PCV-20 today.  She is up-to-date with her other cancer screenings.  I encouraged her to continue her healthy diet and exercise.  She promised to send a video when she hits 300 and her bowling score.  We will see her back in 1 year for continued follow-up.  She knows to call us for any other issue that may develop before her next visit  Total encounter time 20 minutes.Kelsey Bihari, NP 02/18/21 12:45 PM Medical Oncology and Hematology [facility] [place], [place] [address] Tel. [phone]    Fax. [phone]   *Total Encounter Time as defined by the Centers for Medicare and Medicaid Services includes, in addition to the face-to-face time of a patient visit (documented in the note above) non-face-to-face time: obtaining and reviewing outside history, ordering and reviewing medications, tests or procedures, care coordination (communications with other health care  professionals or caregivers) and documentation in the medical record.

## 2021-03-21 DIAGNOSIS — Z6836 Body mass index (BMI) 36.0-36.9, adult: Secondary | ICD-10-CM | POA: Diagnosis not present

## 2021-03-21 DIAGNOSIS — J3089 Other allergic rhinitis: Secondary | ICD-10-CM | POA: Diagnosis not present

## 2021-03-21 DIAGNOSIS — R0981 Nasal congestion: Secondary | ICD-10-CM | POA: Diagnosis not present

## 2021-03-21 DIAGNOSIS — E669 Obesity, unspecified: Secondary | ICD-10-CM | POA: Diagnosis not present

## 2021-03-28 DIAGNOSIS — J3089 Other allergic rhinitis: Secondary | ICD-10-CM | POA: Diagnosis not present

## 2021-03-28 DIAGNOSIS — I10 Essential (primary) hypertension: Secondary | ICD-10-CM | POA: Diagnosis not present

## 2021-03-28 DIAGNOSIS — J019 Acute sinusitis, unspecified: Secondary | ICD-10-CM | POA: Diagnosis not present

## 2021-03-28 DIAGNOSIS — E669 Obesity, unspecified: Secondary | ICD-10-CM | POA: Diagnosis not present

## 2021-08-09 DIAGNOSIS — Z171 Estrogen receptor negative status [ER-]: Secondary | ICD-10-CM | POA: Diagnosis not present

## 2021-08-09 DIAGNOSIS — C50311 Malignant neoplasm of lower-inner quadrant of right female breast: Secondary | ICD-10-CM | POA: Diagnosis not present

## 2021-10-10 DIAGNOSIS — J019 Acute sinusitis, unspecified: Secondary | ICD-10-CM | POA: Diagnosis not present

## 2021-10-29 ENCOUNTER — Other Ambulatory Visit: Payer: Self-pay | Admitting: Obstetrics and Gynecology

## 2021-10-29 DIAGNOSIS — Z1231 Encounter for screening mammogram for malignant neoplasm of breast: Secondary | ICD-10-CM

## 2021-11-01 ENCOUNTER — Encounter: Payer: Self-pay | Admitting: Family

## 2021-11-01 ENCOUNTER — Ambulatory Visit (INDEPENDENT_AMBULATORY_CARE_PROVIDER_SITE_OTHER): Payer: BC Managed Care – PPO | Admitting: Family

## 2021-11-01 ENCOUNTER — Telehealth: Payer: Self-pay | Admitting: Family

## 2021-11-01 VITALS — BP 133/88 | HR 80 | Temp 97.9°F | Resp 18 | Ht 60.0 in | Wt 133.0 lb

## 2021-11-01 DIAGNOSIS — Z0000 Encounter for general adult medical examination without abnormal findings: Secondary | ICD-10-CM | POA: Diagnosis not present

## 2021-11-01 DIAGNOSIS — E785 Hyperlipidemia, unspecified: Secondary | ICD-10-CM | POA: Diagnosis not present

## 2021-11-01 LAB — COMPREHENSIVE METABOLIC PANEL
ALT: 22 U/L (ref 0–35)
AST: 26 U/L (ref 0–37)
Albumin: 4.5 g/dL (ref 3.5–5.2)
Alkaline Phosphatase: 82 U/L (ref 39–117)
BUN: 19 mg/dL (ref 6–23)
CO2: 30 mEq/L (ref 19–32)
Calcium: 9.6 mg/dL (ref 8.4–10.5)
Chloride: 103 mEq/L (ref 96–112)
Creatinine, Ser: 0.93 mg/dL (ref 0.40–1.20)
GFR: 70.06 mL/min (ref >60.00)
Glucose, Bld: 91 mg/dL (ref 70–99)
Potassium: 4.1 mEq/L (ref 3.5–5.1)
Sodium: 141 mEq/L (ref 135–145)
Total Bilirubin: 0.6 mg/dL (ref 0.2–1.2)
Total Protein: 6.9 g/dL (ref 6.0–8.3)

## 2021-11-01 LAB — LIPID PANEL
Cholesterol: 201 mg/dL — ABNORMAL HIGH (ref 0–200)
HDL: 73.7 mg/dL (ref >39.00)
LDL Cholesterol: 115 mg/dL — ABNORMAL HIGH (ref 0–99)
NonHDL: 127.13
Total CHOL/HDL Ratio: 3
Triglycerides: 63 mg/dL (ref 0.0–149.0)
VLDL: 12.6 mg/dL (ref 0.0–40.0)

## 2021-11-01 NOTE — Progress Notes (Signed)
Mailed out to pt 

## 2021-11-01 NOTE — Assessment & Plan Note (Signed)
Wt Readings from Last 3 Encounters:  11/01/21 133 lb (60.3 kg)  02/18/21 134 lb 4.8 oz (60.9 kg)  10/30/20 133 lb (60.3 kg)   Continue healthy diet and regular exercise. Pap/colo/mammo/immunizations up to date. Recommended bivalent covid booster this fall.

## 2021-11-01 NOTE — Progress Notes (Signed)
Subjective:   By signing my name below, I, Carylon Perches, attest that this documentation has been prepared under the direction and in the presence of [place], NP 11/01/2021   Patient ID: Kelsey Henry, female    DOB: May 10, 1965, 56 y.o.   MRN: [id number]  Chief Complaint  Patient presents with   Annual Exam    Doing well , no complaints    HPI Patient is in today for a comprehensive physical exam  Menopause: She reports on menstrual cycles. She does have hot flashes but they are manageable.   Cholesterol: As of today's visit, her cholesterol levels are elevating. She is maintaining a healthy diet.  Lab Results  Component Value Date   CHOL 218 (H) 10/30/2020   HDL 79 10/30/2020   LDLCALC 126 (H) 10/30/2020   TRIG 75 10/30/2020   CHOLHDL 2.8 10/30/2020   Blood Pressure: As of today's visit, her blood pressure is normal. BP Readings from Last 3 Encounters:  11/01/21 133/88  02/18/21 (!) 150/93  10/30/20 116/80   Pulse Readings from Last 3 Encounters:  11/01/21 80  02/18/21 87  10/30/20 80   She denies having any fever, new muscle pain, joint pain , new moles, congestion, sinus pain, sore throat, palpations, cough, SOB ,wheezing,n/v/d constipation, blood in stool, dysuria, frequency, hematuria, depression, anxiety, headaches at this time  Social history: She reports no recent surgeries. She states that her mother recently diagnosed with breast cancer. She also states that her brother, Jenny Reichmann, recently passed away in 06/25/20 from liver cancer.  Colonoscopy: Last completed on 11/08/2018 Pap Smear: She is regularly seeing a gynecologist. She reports that her last pap smear was in 2020/06/25. Mammogram: Last completed on 02/13/2021 Immunizations: She is UTD on her Shingles vaccine. She has also received four Covid-19 vaccines. She is not interested in a HepC screening.  Diet: She reports that she is diet  Exercise: She regularly exercises. She does strength  training and cardio. 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  Dental: She is UTD on Dental Vision: She is UTD on vision.    Health Maintenance Due  Topic Date Due   PAP SMEAR-Modifier  01/12/2021    Past Medical History:  Diagnosis Date   Cancer [facility])    breast   Genetic testing 02/19/2017   STAT Breast panel with reflext to Multi-Cancer panel (83 genes) @ [facility] - No pathogenic mutations detected   GERD (gastroesophageal reflux disease)    no meds   Personal history of chemotherapy    06/25/17   Personal history of radiation therapy    25-Jun-2017   Seasonal allergies    SVD (spontaneous vaginal delivery)    x 1    Past Surgical History:  Procedure Laterality Date   ABDOMINAL HYSTERECTOMY  06/25/98   BILATERAL SALPINGECTOMY Bilateral 06/23/2012   Procedure: BILATERAL SALPINGECTOMY;  Surgeon: Cheri Fowler, MD;  Location: [facility];  Service: Gynecology;  Laterality: Bilateral;   BREAST LUMPECTOMY Right 03/23/2017   BREAST LUMPECTOMY WITH RADIOACTIVE SEED AND SENTINEL LYMPH NODE BIOPSY Right 03/23/2017   Procedure: BREAST LUMPECTOMY WITH RADIOACTIVE SEED AND SENTINEL LYMPH NODE BIOPSY;  Surgeon: Rolm Bookbinder, MD;  Location: [facility];  Service: General;  Laterality: Right;   LAPAROSCOPIC SUPRACERVICAL HYSTERECTOMY N/A 06/23/2012   Procedure: LAPAROSCOPIC SUPRACERVICAL HYSTERECTOMY;  Surgeon: Cheri Fowler, MD;  Location: [facility];  Service: Gynecology;  Laterality: N/A;   PORT-A-CATH REMOVAL N/A 08/13/2017   Procedure: REMOVAL PORT-A-CATH;  Surgeon: Rolm Bookbinder, MD;  Location:  [facility];  Service: General;  Laterality: N/A;   PORTACATH PLACEMENT Right 03/23/2017   Procedure: INSERTION PORT-A-CATH;  Surgeon: Rolm Bookbinder, MD;  Location: [facility];  Service: General;  Laterality: Right;   TUBAL LIGATION     VULVAR LESION REMOVAL N/A 11/01/2014   Procedure: MONS PUBIS LESION;  Surgeon: Cheri Fowler, MD;  Location: [facility];  Service: Gynecology;  Laterality: N/A;   local anesthesia with IV sedation if needed   WISDOM TOOTH EXTRACTION      Family History  Problem Relation Age of Onset   Hypertension Mother    Diabetes Mellitus II Mother    Breast cancer Mother        stage 58, s/p mastectomy   Hypertension Sister    Hypertension Sister    Liver cancer Brother    Diabetes type II Maternal Grandmother    Hypertension Maternal Grandmother    Pancreatic cancer Neg Hx    Colon cancer Neg Hx     Social History   Socioeconomic History   Marital status: Married    Spouse name: Not on file   Number of children: Not on file   Years of education: Not on file   Highest education level: Not on file  Occupational History   Not on file  Tobacco Use   Smoking status: Never   Smokeless tobacco: Never  Substance and Sexual Activity   Alcohol use: No   Drug use: No   Sexual activity: Yes    Partners: Male    Birth control/protection: Surgical  Other Topics Concern   Not on file  Social History Narrative   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)   One son who lives in [place]   Married   One dog (Diamond) [place]   Enjoys, Materials engineer, reading, bowling (has her own league) [employer]   Part time hair stylist   Social Determinants of Health   Financial Resource Strain: Not on file  Food Insecurity: Not on file  Transportation Needs: Not on file  Physical Activity: Not on file  Stress: Not on file  Social Connections: Not on file  Intimate Partner Violence: Not At Risk (11/30/2017)   Humiliation, Afraid, Rape, and Kick questionnaire    Fear of Current or Ex-Partner: No    Emotionally Abused: No    Physically Abused: No    Sexually Abused: No    Outpatient Medications Prior to Visit  Medication Sig Dispense Refill   Biotin 1 MG CAPS Take by mouth. 30 capsule    cholecalciferol (VITAMIN D3) 25 MCG (1000 UNIT) tablet Take 1,000 Units by mouth daily.     COVID-19 mRNA Vac-TriS, Pfizer, SUSP injection Inject into the  muscle. 0.3 mL 0   loratadine (CLARITIN) 10 MG tablet Take 10 mg by mouth daily as needed for allergies.     Multiple Vitamins-Minerals (MULTIVITAMIN ADULT) TABS Take by mouth.     Probiotic Product (PROBIOTIC-10 PO) Take 1 capsule by mouth daily.     No facility-administered medications prior to visit.    Allergies  Allergen Reactions   Aspirin Hives    Review of Systems  Constitutional:  Negative for fever.  HENT:  Negative for congestion, sinus pain and sore throat.   Respiratory:  Negative for cough, shortness of breath and wheezing.   Cardiovascular:  Negative for palpitations.  Gastrointestinal:  Negative for blood in stool, constipation, diarrhea, nausea and vomiting.  Genitourinary:  Negative for dysuria, frequency and hematuria.  Musculoskeletal:  Negative for joint pain and myalgias.  Skin:        (-) [place]  Neurological:  Negative for headaches.  Psychiatric/Behavioral:  Negative for depression. The patient is not nervous/anxious.        Objective:    Physical Exam Constitutional:      General: She is not in acute distress.    Appearance: Normal appearance. She is not ill-appearing.  HENT:     Head: Normocephalic and atraumatic.     Right Ear: Tympanic membrane, ear canal and external ear normal.     Left Ear: Tympanic membrane, ear canal and external ear normal.     Mouth/Throat:     Pharynx: No oropharyngeal exudate.  Eyes:     Extraocular Movements: Extraocular movements intact.     Pupils: Pupils are equal, round, and reactive to light.  Neck:     Thyroid: No thyromegaly.  Cardiovascular:     Rate and Rhythm: Normal rate and regular rhythm.     Heart sounds: Normal heart sounds. No murmur heard.    No gallop.  Pulmonary:     Effort: Pulmonary effort is normal. No respiratory distress.     Breath sounds: Normal breath sounds. No wheezing or rales.  Abdominal:     General: Bowel sounds are normal. There is no distension.     Palpations: Abdomen  is soft.     Tenderness: There is no abdominal tenderness. There is no guarding.  Musculoskeletal:     Comments: 5/5 strength upper and lower extremeties   Lymphadenopathy:     Cervical: No cervical adenopathy.  Skin:    General: Skin is warm and dry.  Neurological:     Mental Status: She is alert and oriented to person, place, and time.     Deep Tendon Reflexes:     Reflex Scores:      Patellar reflexes are 2+ on the right side and 2+ on the left side. Psychiatric:        Judgment: Judgment normal.     BP 133/88   Pulse 80   Temp 97.9 F (36.6 C)   Resp 18   Ht 5' (1.524 m)   Wt 133 lb (60.3 kg)   LMP 05/26/2012   SpO2 99%   BMI 25.97 kg/m  Wt Readings from Last 3 Encounters:  11/01/21 133 lb (60.3 kg)  02/18/21 134 lb 4.8 oz (60.9 kg)  10/30/20 133 lb (60.3 kg)       Assessment & Plan:   Problem List Items Addressed This Visit       Unprioritized   Preventative health care    Wt Readings from Last 3 Encounters:  11/01/21 133 lb (60.3 kg)  02/18/21 134 lb 4.8 oz (60.9 kg)  10/30/20 133 lb (60.3 kg)  Continue healthy diet and regular exercise. Pap/colo/mammo/immunizations up to date. Recommended bivalent covid booster this fall.       Other Visit Diagnoses     Hyperlipidemia, unspecified hyperlipidemia type    -  Primary   Relevant Orders   Lipid panel   Comp Met (CMET)       No orders of the defined types were placed in this encounter.   I, Nance Pear, NP, personally preformed the services described in this documentation.  All medical record entries made by the scribe were at my direction and in my presence.  I have reviewed the chart and discharge instructions (if applicable) and agree that the record reflects  my personal performance and is accurate and complete. 11/01/2021   I,Amber Collins,acting as a scribe for Nance Pear, NP.,have documented all relevant documentation on the behalf of Nance Pear, NP,as directed by   Nance Pear, NP while in the presence of Nance Pear, NP.    Nance Pear, NP

## 2021-11-01 NOTE — Telephone Encounter (Signed)
Records release faxed 

## 2021-11-01 NOTE — Telephone Encounter (Signed)
Please request Pap report from Dr. Cheri Fowler.

## 2021-11-12 ENCOUNTER — Other Ambulatory Visit: Payer: Self-pay | Admitting: Obstetrics and Gynecology

## 2021-11-12 DIAGNOSIS — Z9889 Other specified postprocedural states: Secondary | ICD-10-CM

## 2021-11-12 DIAGNOSIS — Z853 Personal history of malignant neoplasm of breast: Secondary | ICD-10-CM

## 2022-01-09 DIAGNOSIS — J019 Acute sinusitis, unspecified: Secondary | ICD-10-CM | POA: Diagnosis not present

## 2022-02-14 ENCOUNTER — Ambulatory Visit: Payer: BC Managed Care – PPO

## 2022-02-14 ENCOUNTER — Ambulatory Visit
Admission: RE | Admit: 2022-02-14 | Discharge: 2022-02-14 | Disposition: A | Discharge status: Home / Self Care | Payer: BC Managed Care – PPO | Source: Ambulatory Visit | Attending: Obstetrics and Gynecology | Admitting: Obstetrics and Gynecology

## 2022-02-14 DIAGNOSIS — R92333 Mammographic heterogeneous density, bilateral breasts: Secondary | ICD-10-CM | POA: Diagnosis not present

## 2022-02-14 DIAGNOSIS — Z853 Personal history of malignant neoplasm of breast: Secondary | ICD-10-CM

## 2022-02-14 DIAGNOSIS — Z9889 Other specified postprocedural states: Secondary | ICD-10-CM

## 2022-02-18 ENCOUNTER — Inpatient Hospital Stay: Payer: BC Managed Care – PPO | Attending: Adult Health | Admitting: Adult Health

## 2022-02-18 ENCOUNTER — Encounter: Payer: Self-pay | Admitting: Adult Health

## 2022-02-18 ENCOUNTER — Other Ambulatory Visit: Payer: Self-pay

## 2022-02-18 VITALS — BP 146/94 | HR 85 | Temp 97.7°F | Resp 18 | Ht 60.0 in | Wt 134.7 lb

## 2022-02-18 DIAGNOSIS — Z923 Personal history of irradiation: Secondary | ICD-10-CM | POA: Diagnosis not present

## 2022-02-18 DIAGNOSIS — Z171 Estrogen receptor negative status [ER-]: Secondary | ICD-10-CM

## 2022-02-18 DIAGNOSIS — Z853 Personal history of malignant neoplasm of breast: Secondary | ICD-10-CM | POA: Diagnosis not present

## 2022-02-18 DIAGNOSIS — Z90710 Acquired absence of both cervix and uterus: Secondary | ICD-10-CM | POA: Diagnosis not present

## 2022-02-18 DIAGNOSIS — Z803 Family history of malignant neoplasm of breast: Secondary | ICD-10-CM | POA: Diagnosis not present

## 2022-02-18 DIAGNOSIS — C50311 Malignant neoplasm of lower-inner quadrant of right female breast: Secondary | ICD-10-CM

## 2022-02-18 DIAGNOSIS — Z800 Family history of malignant neoplasm of digestive organs: Secondary | ICD-10-CM | POA: Insufficient documentation

## 2022-02-18 NOTE — Progress Notes (Unsigned)
[facility] Follow up:    Kelsey Alar, NP [address] [place] [address]   DIAGNOSIS: Cancer Staging  Malignant neoplasm of lower-inner quadrant of right breast of female, estrogen receptor negative ([place]) Staging form: Breast, AJCC 8th Edition - Clinical: Stage IB (cT1b, cN0, cM0, G3, ER-, PR-, HER2-) - Unsigned Neoadjuvant therapy: No Method of lymph node assessment: Clinical Histologic grading system: 3 grade system Laterality: Right Tumor size (mm): 9 - Pathologic: Stage IB (pT1b, pN0, cM0, G3, ER-, PR-, HER2-) - Signed by Gardenia Phlegm, NP on 04/01/2017 Histologic grading system: 3 grade system   SUMMARY OF ONCOLOGIC HISTORY: Oncology History  Malignant neoplasm of lower-inner quadrant of right breast of female, estrogen receptor negative ([place])  02/09/2017 Initial Diagnosis   status post right breast lower inner quadrant biopsy for a clinical T1b N0, stage IB invasive ductal carcinoma, grade 3, triple negative   02/19/2017 Genetic Testing   No pathogenic mutations detected in ALK, APC, ATM, AXIN2, BAP1, BARD1, BLM, BMPR1A, BRCA1, BRCA2, BRIP1, CASR, CDC73, CDH1, CDK4, CDKN1B, CDKN1C, CDKN2A, CEBPA, CHEK2, CTNNA1, DICER1, DIS3L2, EGFR, EPCAM, FH, FLCN, GATA2, GPC3, GREM1, HOXB13, HRAS, KIT, MAX, MEN1, MET, MITF, MLH1, MSH2, MSH3, MSH6, MUTYH, NBN, NF1, NF2, NTHL1, PALB2, PDGFRA, PHOX2B, PMS2, POLD1, POLE, POT1, PRKAR1A, PTCH1, PTEN, RAD50, RAD51C, RAD51D, RB1, RECQL4, RET, RUNX1, SDHA, SDHAF2, SDHB, SDHC, SDHD, SMAD4, SMARCA4, SMARCB1, SMARCE1, STK11, SUFU, TERC, TERT, TMEM127, TP53, TSC1, TSC2, VHL, WRN, WT1.             (a) Variants of Uncertain Significance in PDGFRA c.3040G>A (p.Ala1014Thr) and SMARCA4 c.2044C>G (p.Leu682Val) noted   03/23/2017 Surgery   status post right lumpectomy for a pT1b pN0, stage IB invasive ductal carcinoma, grade 3, with negative margins.   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 The patient then received a boost to the seroma. This delivered an additional 10 Gy in 5 fractions. The total dose was 60.4 Gy.     CURRENT THERAPY: observation  INTERVAL HISTORY: Kelsey Henry 56 y.o. female returns for    Patient Active Problem List   Diagnosis Date Noted   Preventative health care 10/30/2020   Port-A-Cath in place 06/03/2017   Chemotherapy induced neutropenia ([place]) 05/05/2017   Genetic testing 02/19/2017   Malignant neoplasm of lower-inner quadrant of right breast of female, estrogen receptor negative ([place]) 02/17/2017   Vulvar lesion 11/01/2014   Menorrhagia 06/23/2012   Dysmenorrhea 06/23/2012    is allergic to aspirin.  MEDICAL HISTORY: Past Medical History:  Diagnosis Date   Cancer [facility])    breast   Genetic testing 02/19/2017   STAT Breast panel with reflext to Multi-Cancer panel (83 genes) @ [facility] - No pathogenic mutations detected   GERD (gastroesophageal reflux disease)    no meds   Personal history of chemotherapy    2019   Personal history of radiation therapy    2019   Seasonal allergies    SVD (spontaneous vaginal delivery)    x 1  SURGICAL HISTORY: Past Surgical History:  Procedure Laterality Date   ABDOMINAL HYSTERECTOMY  2000   BILATERAL SALPINGECTOMY Bilateral 06/23/2012   Procedure: BILATERAL SALPINGECTOMY;  Surgeon: Cheri Fowler, MD;  Location: [facility];  Service: Gynecology;  Laterality: Bilateral;   BREAST BIOPSY Left    BREAST LUMPECTOMY Right 03/23/2017   BREAST  LUMPECTOMY WITH RADIOACTIVE SEED AND SENTINEL LYMPH NODE BIOPSY Right 03/23/2017   Procedure: BREAST LUMPECTOMY WITH RADIOACTIVE SEED AND SENTINEL LYMPH NODE BIOPSY;  Surgeon: Rolm Bookbinder, MD;  Location: [facility];  Service: General;  Laterality: Right;   LAPAROSCOPIC SUPRACERVICAL HYSTERECTOMY N/A 06/23/2012   Procedure: LAPAROSCOPIC SUPRACERVICAL HYSTERECTOMY;  Surgeon: Cheri Fowler, MD;  Location: [facility];  Service: Gynecology;  Laterality: N/A;   PORT-A-CATH REMOVAL N/A 08/13/2017   Procedure: REMOVAL PORT-A-CATH;  Surgeon: Rolm Bookbinder, MD;  Location: [facility];  Service: General;  Laterality: N/A;   PORTACATH PLACEMENT Right 03/23/2017   Procedure: INSERTION PORT-A-CATH;  Surgeon: Rolm Bookbinder, MD;  Location: [facility];  Service: General;  Laterality: Right;   TUBAL LIGATION     VULVAR LESION REMOVAL N/A 11/01/2014   Procedure: MONS PUBIS LESION;  Surgeon: Cheri Fowler, MD;  Location: [facility];  Service: Gynecology;  Laterality: N/A;  local anesthesia with IV sedation if needed   WISDOM TOOTH EXTRACTION      SOCIAL HISTORY: Social History   Socioeconomic History   Marital status: Married    Spouse name: Not on file   Number of children: Not on file   Years of education: Not on file   Highest education level: Not on file  Occupational History   Not on file  Tobacco Use   Smoking status: Never   Smokeless tobacco: Never  Substance and Sexual Activity   Alcohol use: No   Drug use: No   Sexual activity: Yes    Partners: Male    Birth control/protection: Surgical  Other Topics Concern   Not on file  Social History Narrative   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)   One son who lives in [place]   Married   One dog (Diamond) [place]   Enjoys, Materials engineer, reading, bowling (has her own league) [employer]   Part time hair stylist   Social Determinants of Radio broadcast assistant Strain: Not on file  Food Insecurity:  Not on file  Transportation Needs: Not on file  Physical Activity: Not on file  Stress: Not on file  Social Connections: Not on file  Intimate Partner Violence: Not At Risk (11/30/2017)   Humiliation, Afraid, Rape, and Kick questionnaire    Fear of Current or Ex-Partner: No    Emotionally Abused: No    Physically Abused: No    Sexually Abused: No    FAMILY HISTORY: Family History  Problem Relation Age of Onset   Hypertension Mother    Diabetes Mellitus II Mother    Breast cancer Mother 50       stage 14, s/p mastectomy   Hypertension Sister    Hypertension Sister    Diabetes type II Maternal Grandmother    Hypertension Maternal Grandmother    Liver cancer Brother    Pancreatic cancer Neg Hx    Colon cancer Neg Hx     Review of Systems  Constitutional:  Negative for appetite change, chills, fatigue, fever and unexpected weight change.  HENT:   Negative for hearing loss, lump/mass and trouble swallowing.   Eyes:  Negative for eye problems and icterus.  Respiratory:  Negative for chest tightness, cough and shortness of breath.   Cardiovascular:  Negative for chest pain, leg swelling and palpitations.  Gastrointestinal:  Negative for abdominal distention, abdominal pain, constipation, diarrhea, nausea and vomiting.  Endocrine: Negative for hot flashes.  Genitourinary:  Negative for difficulty urinating.   Musculoskeletal:  Negative for arthralgias.  Skin:  Negative for itching and rash.  Neurological:  Negative for dizziness, extremity weakness, headaches and numbness.  Hematological:  Negative for adenopathy. Does not bruise/bleed easily.  Psychiatric/Behavioral:  Negative for depression. The patient is not nervous/anxious.       PHYSICAL EXAMINATION  ECOG PERFORMANCE STATUS: {CHL ONC ECOG [phone]}  There were no vitals filed for this visit.  Physical Exam Constitutional:      General: She is not in acute distress.    Appearance: Normal appearance. She is not  toxic-appearing.  HENT:     Head: Normocephalic and atraumatic.  Eyes:     General: No scleral icterus. Cardiovascular:     Rate and Rhythm: Normal rate and regular rhythm.     Pulses: Normal pulses.     Heart sounds: Normal heart sounds.  Pulmonary:     Effort: Pulmonary effort is normal.     Breath sounds: Normal breath sounds.  Abdominal:     General: Abdomen is flat. Bowel sounds are normal. There is no distension.     Palpations: Abdomen is soft.     Tenderness: There is no abdominal tenderness.  Musculoskeletal:        General: No swelling.     Cervical back: Neck supple.  Lymphadenopathy:     Cervical: No cervical adenopathy.  Skin:    General: Skin is warm and dry.     Findings: No rash.  Neurological:     General: No focal deficit present.     Mental Status: She is alert.  Psychiatric:        Mood and Affect: Mood normal.        Behavior: Behavior normal.     LABORATORY DATA:  CBC    Component Value Date/Time   WBC 5.5 10/02/2020 0814   RBC 4.76 10/02/2020 0814   HGB 14.1 10/02/2020 0814   HGB 14.5 10/26/2018 0955   HGB 14.8 02/18/2017 1221   HCT 41.3 10/02/2020 0814   HCT 44.4 10/26/2018 0955   HCT 43.3 02/18/2017 1221   PLT 218 10/02/2020 0814   PLT 251 10/26/2018 0955   MCV 86.8 10/02/2020 0814   MCV 91 10/26/2018 0955   MCV 88.5 02/18/2017 1221   MCH 29.6 10/02/2020 0814   MCHC 34.1 10/02/2020 0814   RDW 11.8 10/02/2020 0814   RDW 12.2 10/26/2018 0955   RDW 12.6 02/18/2017 1221   LYMPHSABS 1.9 10/02/2020 0814   LYMPHSABS 1.9 10/26/2018 0955   LYMPHSABS 2.1 02/18/2017 1221   MONOABS 0.5 10/02/2020 0814   MONOABS 0.5 02/18/2017 1221   EOSABS 0.2 10/02/2020 0814   EOSABS 0.1 10/26/2018 0955   BASOSABS 0.0 10/02/2020 0814   BASOSABS 0.0 10/26/2018 0955   BASOSABS 0.0 02/18/2017 1221    CMP     Component Value Date/Time   NA 141 11/01/2021 0718   NA 139 10/26/2018 0955   NA 142 02/18/2017 1221   K 4.1 11/01/2021 0718   K 4.6  02/18/2017 1221   CL 103 11/01/2021 0718   CO2 30 11/01/2021 0718   CO2 27 02/18/2017 1221   GLUCOSE 91 11/01/2021 0718   GLUCOSE 95 02/18/2017 1221  BUN 19 11/01/2021 0718   BUN 14 10/26/2018 0955   BUN 18.4 02/18/2017 1221   CREATININE 0.93 11/01/2021 0718   CREATININE 1.00 10/02/2020 0805   CREATININE 1.0 02/18/2017 1221   CALCIUM 9.6 11/01/2021 0718   CALCIUM 10.3 02/18/2017 1221   PROT 6.9 11/01/2021 0718   PROT 7.6 10/26/2018 0955   PROT 8.3 02/18/2017 1221   ALBUMIN 4.5 11/01/2021 0718   ALBUMIN 4.9 (H) 10/26/2018 0955   ALBUMIN 4.4 02/18/2017 1221   AST 26 11/01/2021 0718   AST 35 10/02/2020 0805   AST 22 02/18/2017 1221   ALT 22 11/01/2021 0718   ALT 28 10/02/2020 0805   ALT 18 02/18/2017 1221   ALKPHOS 82 11/01/2021 0718   ALKPHOS 89 02/18/2017 1221   BILITOT 0.6 11/01/2021 0718   BILITOT 0.6 10/02/2020 0805   BILITOT 0.48 02/18/2017 1221   GFRNONAA >60 10/02/2020 0805   GFRAA >60 10/03/2019 0915   GFRAA >60 03/01/2018 0818       PENDING LABS:   RADIOGRAPHIC STUDIES:  No results found.   PATHOLOGY:     ASSESSMENT and THERAPY PLAN:   No problem-specific Assessment & Plan notes found for this encounter.   No orders of the defined types were placed in this encounter.   All questions were answered. The patient knows to call the clinic with any problems, questions or concerns. We can certainly see the patient much sooner if necessary. This note was electronically signed. Scot Dock, NP 02/18/2022

## 2022-02-20 ENCOUNTER — Encounter: Payer: Self-pay | Admitting: Oncology

## 2022-02-20 NOTE — Assessment & Plan Note (Signed)
Kelsey Henry is a 56 year-old woman with a h/o stage IB triple negative breast cancer diagnosed in 01/2017 s/p lumpectomy, adjuvant chemotherapy, and adjuvant radiation therapy.    [name] has no clinical or radiographic sign of breast cancer recurrence.  She will continue to undergo annual mammograms, next due in 02/2023.    I recommended she continue healthy diet, exercise, and continued f/u with her PCP.  She will return in 1 year for continued surveillance.

## 2022-03-03 DIAGNOSIS — Z124 Encounter for screening for malignant neoplasm of cervix: Secondary | ICD-10-CM | POA: Diagnosis not present

## 2022-03-03 DIAGNOSIS — Z6826 Body mass index (BMI) 26.0-26.9, adult: Secondary | ICD-10-CM | POA: Diagnosis not present

## 2022-03-03 DIAGNOSIS — Z1151 Encounter for screening for human papillomavirus (HPV): Secondary | ICD-10-CM | POA: Diagnosis not present

## 2022-03-03 DIAGNOSIS — Z1389 Encounter for screening for other disorder: Secondary | ICD-10-CM | POA: Diagnosis not present

## 2022-03-03 DIAGNOSIS — Z01419 Encounter for gynecological examination (general) (routine) without abnormal findings: Secondary | ICD-10-CM | POA: Diagnosis not present

## 2022-03-03 DIAGNOSIS — Z130 Encounter for screening for diseases of the blood and blood-forming organs and certain disorders involving the immune mechanism: Secondary | ICD-10-CM | POA: Diagnosis not present

## 2022-03-03 DIAGNOSIS — R82998 Other abnormal findings in urine: Secondary | ICD-10-CM | POA: Diagnosis not present

## 2022-03-03 LAB — HM PAP SMEAR: HM Pap smear: NEGATIVE

## 2022-11-05 ENCOUNTER — Ambulatory Visit (INDEPENDENT_AMBULATORY_CARE_PROVIDER_SITE_OTHER): Payer: BC Managed Care – PPO | Admitting: Family

## 2022-11-05 ENCOUNTER — Telehealth: Payer: Self-pay | Admitting: Family

## 2022-11-05 ENCOUNTER — Encounter: Payer: Self-pay | Admitting: Oncology

## 2022-11-05 ENCOUNTER — Encounter: Payer: Self-pay | Admitting: Family

## 2022-11-05 ENCOUNTER — Other Ambulatory Visit (HOSPITAL_BASED_OUTPATIENT_CLINIC_OR_DEPARTMENT_OTHER): Payer: Self-pay

## 2022-11-05 VITALS — BP 142/88 | HR 78 | Resp 18 | Ht 60.0 in | Wt 138.4 lb

## 2022-11-05 DIAGNOSIS — I10 Essential (primary) hypertension: Secondary | ICD-10-CM

## 2022-11-05 DIAGNOSIS — Z0000 Encounter for general adult medical examination without abnormal findings: Secondary | ICD-10-CM

## 2022-11-05 MED ORDER — AMLODIPINE BESYLATE 5 MG PO TABS
5.0000 mg | ORAL_TABLET | Freq: Every day | ORAL | 3 refills | Status: DC
  Filled 2022-11-05 (×2): qty 30, 30d supply, fill #0

## 2022-11-05 MED ORDER — AMLODIPINE BESYLATE 5 MG PO TABS: 5.0000 mg | ORAL_TABLET | Freq: Every day | ORAL | 3 refills | Status: DC

## 2022-11-05 NOTE — Telephone Encounter (Signed)
Pap requested. 

## 2022-11-05 NOTE — Telephone Encounter (Signed)
Please call Dr. Eligha Bridegroom office to request a copy of last pap.

## 2022-11-05 NOTE — Addendum Note (Signed)
Addended by: Mervin Kung A on: 11/05/2022 08:27 AM   Modules accepted: Orders

## 2022-11-05 NOTE — Addendum Note (Signed)
Addended by: Mervin Kung A on: 11/05/2022 08:15 AM   Modules accepted: Orders

## 2022-11-05 NOTE — Assessment & Plan Note (Signed)
Elevated blood pressure readings in the office. The last 2 readings have been above goal. Recommended that we begin amlodipine and recheck bp in 2 weeks.

## 2022-11-05 NOTE — Progress Notes (Signed)
Subjective:     Patient ID: Kelsey Henry, female    DOB: [birth year], 57 y.o.   MRN: [id number]  Chief Complaint  Patient presents with   Annual Exam    HPI  Discussed the use of AI scribe software for clinical note transcription with the patient, who gave verbal consent to proceed.  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 She reports adherence to a healthy diet and regular exercise. She has completed her shingles series and tetanus immunizations, and plans to update her flu shot and COVID vaccine in the fall. Her colonoscopy in 2020 was normal, and she is up-to-date with her mammogram. She sees a gynecologist regularly and believes she had a Pap smear last year. She has regular dental and vision check-ups. She denies any current cough or cold symptoms, skin rashes or moles of concern, hearing or vision concerns, swelling, constipation or diarrhea, urinary concerns, unusual muscle or joint pain, and frequent headaches. She also denies any current concerns about depression or anxiety. She does not take any prescription medications. Her blood pressure was slightly elevated during the visit, which she attributes to "white coat hypertension." She has access to a blood pressure cuff at home and agrees to monitor her blood pressure daily for a week.  Immunizations: up to date Diet:healthy Exercise: regular Wt Readings from Last 3 Encounters:  11/05/22 138 lb 6.4 oz (62.8 kg)  02/18/22 134 lb 11.2 oz (61.1 kg)  11/01/21 133 lb (60.3 kg)  Colonoscopy: 2020 Pap Smear: Dr.  Annamarie Major: up to date Vision: up to date Dental: up to date   BP Readings from Last 3 Encounters:  11/05/22 (!) 142/88  02/18/22 (!) 146/94  11/01/21 133/88        Health Maintenance Due  Topic Date Due   Hepatitis C Screening  Never done   PAP SMEAR-Modifier  01/13/2020    Past Medical History:  Diagnosis Date    Cancer [facility])    breast   Genetic testing 02/19/2017   STAT Breast panel with reflext to Multi-Cancer panel (83 genes) @ [facility] - No pathogenic mutations detected   GERD (gastroesophageal reflux disease)    no meds   Personal history of chemotherapy    2019   Personal history of radiation therapy    2019   Seasonal allergies    SVD (spontaneous vaginal delivery)    x 1    Past Surgical History:  Procedure Laterality Date   ABDOMINAL HYSTERECTOMY  2000   BILATERAL SALPINGECTOMY Bilateral 06/23/2012   Procedure: BILATERAL SALPINGECTOMY;  Surgeon: Lavina Hamman, MD;  Location: [facility];  Service: Gynecology;  Laterality: Bilateral;   BREAST BIOPSY Left    BREAST LUMPECTOMY Right 03/23/2017   BREAST LUMPECTOMY WITH RADIOACTIVE SEED AND SENTINEL LYMPH NODE BIOPSY Right 03/23/2017   Procedure: BREAST LUMPECTOMY WITH RADIOACTIVE SEED AND SENTINEL LYMPH NODE BIOPSY;  Surgeon: Emelia Loron, MD;  Location: [facility] OR;  Service: General;  Laterality: Right;   LAPAROSCOPIC SUPRACERVICAL HYSTERECTOMY N/A 06/23/2012   Procedure: LAPAROSCOPIC SUPRACERVICAL HYSTERECTOMY;  Surgeon: Lavina Hamman, MD;  Location: [facility];  Service: Gynecology;  Laterality: N/A;   PORT-A-CATH REMOVAL N/A 08/13/2017   Procedure: REMOVAL PORT-A-CATH;  Surgeon: Emelia Loron, MD;  Location: [facility];  Service: General;  Laterality: N/A;   PORTACATH PLACEMENT Right 03/23/2017   Procedure: INSERTION PORT-A-CATH;  Surgeon: Emelia Loron, MD;  Location: [facility] OR;  Service: General;  Laterality: Right;   TUBAL LIGATION     VULVAR LESION REMOVAL N/A 11/01/2014   Procedure: MONS PUBIS LESION;  Surgeon: Lavina Hamman, MD;  Location: [facility];  Service: Gynecology;  Laterality: N/A;  local anesthesia with IV sedation if needed   WISDOM TOOTH EXTRACTION      Family History  Problem Relation Age of Onset   Hypertension Mother    Diabetes Mellitus II Mother    Breast cancer Mother 79       stage 3, s/p  mastectomy   Hypertension Sister    Hypertension Sister    Diabetes type II Maternal Grandmother    Hypertension Maternal Grandmother    Liver cancer Brother    Pancreatic cancer Neg Hx    Colon cancer Neg Hx     Social History   Socioeconomic History   Marital status: Married    Spouse name: Not on file   Number of children: Not on file   Years of education: Not on file   Highest education level: Not on file  Occupational History   Not on file  Tobacco Use   Smoking status: Never   Smokeless tobacco: Never  Substance and Sexual Activity   Alcohol use: No   Drug use: No   Sexual activity: Yes    Partners: Male    Birth control/protection: Surgical  Other Topics Concern   Not on file  Social History Narrative   Works as an Scientist, 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   One son who lives in [place]   Married   Enjoys, Freight forwarder, reading, bowling (has her own league) [employer]   Part time hair stylist   Social Determinants of Health   Financial Resource Strain: Not on file  Food Insecurity: Not on file  Transportation Needs: Not on file  Physical Activity: Not on file  Stress: Not on file  Social Connections: Not on file  Intimate Partner Violence: Not At Risk (11/30/2017)   Humiliation, Afraid, Rape, and Kick questionnaire    Fear of Current or Ex-Partner: No    Emotionally Abused: No    Physically Abused: No    Sexually Abused: No    Outpatient Medications Prior to Visit  Medication Sig Dispense Refill   Biotin 1 MG CAPS Take by mouth. 30 capsule    cholecalciferol (VITAMIN D3) 25 MCG (1000 UNIT) tablet Take 1,000 Units by mouth daily.     fluticasone (VERAMYST) 27.5 MCG/SPRAY nasal spray Place 2 sprays into the nose daily.     loratadine (CLARITIN) 10 MG tablet Take 10 mg by mouth daily as needed for allergies.     Multiple Vitamins-Minerals (MULTIVITAMIN ADULT) TABS Take by mouth.     No facility-administered medications prior  to visit.    Allergies  Allergen Reactions   Aspirin Hives    Review of Systems  Constitutional:  Negative for weight loss.  HENT:  Negative for congestion and hearing loss.   Eyes:  Negative for blurred vision.  Respiratory:  Negative for cough.   Cardiovascular:  Negative for leg swelling.  Gastrointestinal:  Negative for constipation and diarrhea.  Genitourinary:  Negative for dysuria and frequency.  Musculoskeletal:  Negative for joint pain and myalgias.  Skin:  Negative for rash.  Neurological:  Negative for headaches.  Psychiatric/Behavioral:         Denies depression/anxiety       Objective:    Physical Exam   BP (!) 142/88  Pulse 78   Resp 18   Ht 5' (1.524 m)   Wt 138 lb 6.4 oz (62.8 kg)   LMP 05/26/2012   SpO2 97%   BMI 27.03 kg/m  Wt Readings from Last 3 Encounters:  11/05/22 138 lb 6.4 oz (62.8 kg)  02/18/22 134 lb 11.2 oz (61.1 kg)  11/01/21 133 lb (60.3 kg)  Physical Exam  Constitutional: She is oriented to person, place, and time. She appears well-developed and well-nourished. No distress.  HENT:  Head: Normocephalic and atraumatic.  Right Ear: Tympanic membrane and ear canal normal.  Left Ear: Tympanic membrane and ear canal normal.  Mouth/Throat: Oropharynx is clear and moist.  Eyes: Pupils are equal, round, and reactive to light. No scleral icterus.  Neck: Normal range of motion. No thyromegaly present.  Cardiovascular: Normal rate and regular rhythm.   No murmur heard. Pulmonary/Chest: Effort normal and breath sounds normal. No respiratory distress. He has no wheezes. She has no rales. She exhibits no tenderness.  Abdominal: Soft. Bowel sounds are normal. She exhibits no distension and no mass. There is no tenderness. There is no rebound and no guarding.  Musculoskeletal: She exhibits no edema.  Lymphadenopathy:    She has no cervical adenopathy.  Neurological: She is alert and oriented to person, place, and time. She has normal patellar  reflexes. She exhibits normal muscle tone. Coordination normal.  Skin: Skin is warm and dry.  Psychiatric: She has a normal mood and affect. Her behavior is normal. Judgment and thought content normal.  Breast/Pelvic:  deferred          Assessment & Plan:        Assessment & Plan:   Problem List Items Addressed This Visit       Unprioritized   Preventative health care - Primary    Up to date on immunizations, colonoscopy, and mammogram. Pap smear results to be obtained from gynecologist. -Obtain flu shot and COVID vaccine booster at pharmacy in the fall. -Obtain Pap smear results from Dr. Janee Morn at [facility]. -Continue regular dental and vision check-ups. -Complete full lab panel today at pt request.        Relevant Orders   Comp Met (CMET)   CBC w/Diff   TSH   Lipid panel   Comp Met (CMET)   CBC w/Diff   Lipid panel   TSH   Hypertension    Elevated blood pressure readings in the office. The last 2 readings have been above goal. Recommended that we begin amlodipine and recheck bp in 2 weeks.        Relevant Medications   amLODipine (NORVASC) 5 MG tablet    I am having Fronnie Little-Stevenson maintain her loratadine, Multivitamin Adult, Biotin, cholecalciferol, fluticasone, and amLODipine.  Meds ordered this encounter  Medications   DISCONTD: amLODipine (NORVASC) 5 MG tablet    Sig: Take 1 tablet (5 mg total) by mouth daily.    Dispense:  30 tablet    Refill:  3    Order Specific Question:   Supervising Provider    Answer:   Danise Edge A [[id number]]   amLODipine (NORVASC) 5 MG tablet    Sig: Take 1 tablet (5 mg total) by mouth daily.    Dispense:  30 tablet    Refill:  3    Order Specific Question:   Supervising Provider    Answer:   Danise Edge A [[id number]]  [facility] and resent to [facility] pharmacy  at pt request.

## 2022-11-05 NOTE — Assessment & Plan Note (Signed)
Up to date on immunizations, colonoscopy, and mammogram. Pap smear results to be obtained from gynecologist. -Obtain flu shot and COVID vaccine booster at pharmacy in the fall. -Obtain Pap smear results from Dr. Janee Morn at [facility]. -Continue regular dental and vision check-ups. -Complete full lab panel today at pt request.

## 2022-11-05 NOTE — Telephone Encounter (Signed)
Pt said that her prescription was [facility] to [facility] on [address] but when she went to get it, they told her it was canceled. Please resend prescription

## 2022-11-05 NOTE — Patient Instructions (Signed)
VISIT SUMMARY:  Dear Kelsey Henry, thank you for coming in for your annual physical. You are doing a great job maintaining a healthy lifestyle with regular exercise and a balanced diet. We discussed your slightly elevated blood pressure during the visit, which could be due to 'white coat hypertension'. We also reviewed your up-to-date immunizations, colonoscopy, and mammogram, and discussed the need to obtain your Pap smear results from your gynecologist.  YOUR PLAN:  -HYPERTENSION: Hypertension, or high blood pressure, can increase the risk of heart disease, kidney damage and stroke. We added amlodipine 5mg  once daily.    -GENERAL HEALTH MAINTENANCE: It's important to stay up-to-date with routine health screenings and immunizations. You are doing well in this area. Please remember to get your flu shot and COVID vaccine booster at the pharmacy in the fall, and to obtain your Pap smear results from Dr. Janee Morn at [facility]. Continue with your regular dental and vision check-ups. We also completed a full lab panel today.  -FITNESS AND DIET: Maintaining a healthy diet and regular exercise routine is key to overall health. 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 Please continue with your current fitness and diet regimen.  INSTRUCTIONS:  Please monitor your blood pressure at home daily for a week and report your readings via MyChart. Make sure any Claritin use is not Claritin-D, as it can affect blood pressure. We will recheck your blood pressure in the office in 2 weeks. Remember to get your flu shot and COVID vaccine booster in the fall. We will obtain your Pap smear results from Dr. Jackelyn Knife at [facility].

## 2022-11-06 ENCOUNTER — Encounter: Payer: Self-pay | Admitting: *Deleted

## 2022-11-06 LAB — CBC WITH DIFFERENTIAL/PLATELET
Basophils Absolute: 0 10*3/uL (ref 0.0–0.2)
Basos: 1 %
EOS (ABSOLUTE): 0.2 10*3/uL (ref 0.0–0.4)
Hemoglobin: 13.8 g/dL (ref 11.1–15.9)
Immature Grans (Abs): 0 10*3/uL (ref 0.0–0.1)
Immature Granulocytes: 0 %
Lymphs: 34 %
MCH: 29.4 pg (ref 26.6–33.0)
MCV: 89 fL (ref 79–97)
Monocytes Absolute: 0.5 10*3/uL (ref 0.1–0.9)
Monocytes: 8 %
Neutrophils Absolute: 3.3 10*3/uL (ref 1.4–7.0)
Neutrophils: 54 %
Platelets: 250 10*3/uL (ref 150–450)
RBC: 4.7 x10E6/uL (ref 3.77–5.28)
RDW: 12 % (ref 11.7–15.4)
WBC: 5.9 10*3/uL (ref 3.4–10.8)

## 2022-11-06 LAB — COMPREHENSIVE METABOLIC PANEL
ALT: 29 IU/L (ref 0–32)
AST: 38 IU/L (ref 0–40)
Albumin: 4.6 g/dL (ref 3.8–4.9)
BUN/Creatinine Ratio: 19 (ref 9–23)
BUN: 17 mg/dL (ref 6–24)
Bilirubin Total: 0.4 mg/dL (ref 0.0–1.2)
CO2: 21 mmol/L (ref 20–29)
Calcium: 9.7 mg/dL (ref 8.7–10.2)
Chloride: 102 mmol/L (ref 96–106)
Creatinine, Ser: 0.91 mg/dL (ref 0.57–1.00)
Globulin, Total: 2.4 g/dL (ref 1.5–4.5)
Glucose: 86 mg/dL (ref 70–99)
Potassium: 4.4 mmol/L (ref 3.5–5.2)
Sodium: 139 mmol/L (ref 134–144)
eGFR: 75 mL/min/{1.73_m2} (ref >59)

## 2022-11-06 LAB — LIPID PANEL
Chol/HDL Ratio: 2.5 ratio (ref 0.0–4.4)
Cholesterol, Total: 231 mg/dL — ABNORMAL HIGH (ref 100–199)
HDL: 91 mg/dL (ref >39)
Triglycerides: 65 mg/dL (ref 0–149)

## 2022-11-06 LAB — TSH: TSH: 3.64 u[IU]/mL (ref 0.450–4.500)

## 2022-11-06 MED ORDER — AMLODIPINE BESYLATE 5 MG PO TABS: 5.0000 mg | ORAL_TABLET | Freq: Every day | ORAL | 3 refills | Status: DC

## 2022-11-06 NOTE — Telephone Encounter (Signed)
Spoke with patient and she needed the amlodipine.  [facility]

## 2022-11-06 NOTE — Addendum Note (Signed)
Addended by: Thelma Barge D on: 11/06/2022 09:39 AM   Modules accepted: Orders

## 2022-11-19 ENCOUNTER — Ambulatory Visit: Payer: BC Managed Care – PPO | Admitting: Family

## 2022-11-19 VITALS — BP 136/82 | HR 82 | Resp 18 | Ht 60.0 in | Wt 136.0 lb

## 2022-11-19 DIAGNOSIS — E785 Hyperlipidemia, unspecified: Secondary | ICD-10-CM | POA: Diagnosis not present

## 2022-11-19 DIAGNOSIS — I10 Essential (primary) hypertension: Secondary | ICD-10-CM | POA: Diagnosis not present

## 2022-11-19 NOTE — Assessment & Plan Note (Signed)
BP is improved and at goal. Continue amlodipine 5mg .

## 2022-11-19 NOTE — Patient Instructions (Signed)
VISIT SUMMARY:  During your visit, we discussed your hypertension, high cholesterol levels, and weight concerns. You've been doing well with your blood pressure medication, Amlodipine, and we will continue with this. We also talked about your diet and its impact on your cholesterol levels and weight. We agreed on the importance of making dietary changes and losing weight to improve your overall health.  YOUR PLAN:  -HYPERTENSION: Hypertension, or high blood pressure, can cause various health issues if not managed. You've been doing well with your current medication, Amlodipine, and we will continue with this. We will reassess your blood pressure control in 3 months.  -HIGH CHOLESTEROL: High cholesterol can lead to heart disease. We discussed the impact of diet on cholesterol levels and the importance of reducing saturated and animal fats in your diet.  -OVERWEIGHT: Being overweight can increase the risk of various health problems. We discussed your desire to lose weight and agreed on the importance of achieving a BMI of 25 or less. We will assess your progress in 3 months.  INSTRUCTIONS:  Continue taking your Amlodipine daily for your hypertension. Make dietary modifications to reduce your cholesterol levels, specifically reducing saturated and animal fats. Work on losing weight to achieve a healthier BMI. We will follow up in 3 months to assess your progress.

## 2022-11-19 NOTE — Assessment & Plan Note (Signed)
Mild hyperlipidemia noted. Advised pt to work on limiting animal fats/saturated fats in her diet.

## 2022-11-19 NOTE — Progress Notes (Signed)
Subjective:     Patient ID: Kelsey Henry, female    DOB: May 07, 1965, 57 y.o.   MRN: [id number]  Chief Complaint  Patient presents with   Follow-up    HPI  Discussed the use of AI scribe software for clinical note transcription with the patient, who gave verbal consent to proceed.  History of Present Illness   The patient, with a history of hypertension, presents for a follow-up visit after a recent addition of amlodipine. She reports good tolerance of the medication with no side effects and an overall improvement in her well-being. She had previously attributed her symptoms of 'fogginess' to sinus issues, but now believes it was due to her hypertension.  The patient also has concerns about her weight and is aiming to reduce it to 125 pounds. She is aware of the need to reduce her intake of saturated and animal fats. She also expresses concern about a high testosterone level, which she attributes to heredity and diet.      BP Readings from Last 3 Encounters:  11/19/22 136/82  11/05/22 (!) 142/88  02/18/22 (!) 146/94   Wt Readings from Last 3 Encounters:  11/19/22 136 lb (61.7 kg)  11/05/22 138 lb 6.4 oz (62.8 kg)  02/18/22 134 lb 11.2 oz (61.1 kg)       Health Maintenance Due  Topic Date Due   Hepatitis C Screening  Never done   INFLUENZA VACCINE  11/13/2022    Past Medical History:  Diagnosis Date   Cancer [facility])    breast   Genetic testing 02/19/2017   STAT Breast panel with reflext to Multi-Cancer panel (83 genes) @ [facility] - No pathogenic mutations detected   GERD (gastroesophageal reflux disease)    no meds   Personal history of chemotherapy    2019   Personal history of radiation therapy    2019   Seasonal allergies    SVD (spontaneous vaginal delivery)    x 1    Past Surgical History:  Procedure Laterality Date   ABDOMINAL HYSTERECTOMY  2000   BILATERAL SALPINGECTOMY Bilateral 06/23/2012   Procedure: BILATERAL SALPINGECTOMY;  Surgeon:  Lavina Hamman, MD;  Location: [facility];  Service: Gynecology;  Laterality: Bilateral;   BREAST BIOPSY Left    BREAST LUMPECTOMY Right 03/23/2017   BREAST LUMPECTOMY WITH RADIOACTIVE SEED AND SENTINEL LYMPH NODE BIOPSY Right 03/23/2017   Procedure: BREAST LUMPECTOMY WITH RADIOACTIVE SEED AND SENTINEL LYMPH NODE BIOPSY;  Surgeon: Emelia Loron, MD;  Location: [facility] OR;  Service: General;  Laterality: Right;   LAPAROSCOPIC SUPRACERVICAL HYSTERECTOMY N/A 06/23/2012   Procedure: LAPAROSCOPIC SUPRACERVICAL HYSTERECTOMY;  Surgeon: Lavina Hamman, MD;  Location: [facility];  Service: Gynecology;  Laterality: N/A;   PORT-A-CATH REMOVAL N/A 08/13/2017   Procedure: REMOVAL PORT-A-CATH;  Surgeon: Emelia Loron, MD;  Location: [facility];  Service: General;  Laterality: N/A;   PORTACATH PLACEMENT Right 03/23/2017   Procedure: INSERTION PORT-A-CATH;  Surgeon: Emelia Loron, MD;  Location: [facility] OR;  Service: General;  Laterality: Right;   TUBAL LIGATION     VULVAR LESION REMOVAL N/A 11/01/2014   Procedure: MONS PUBIS LESION;  Surgeon: Lavina Hamman, MD;  Location: [facility];  Service: Gynecology;  Laterality: N/A;  local anesthesia with IV sedation if needed   WISDOM TOOTH EXTRACTION      Family History  Problem Relation Age of Onset   Hypertension Mother    Diabetes Mellitus II Mother    Breast cancer Mother 24  stage 3, s/p mastectomy   Hypertension Sister    Hypertension Sister    Diabetes type II Maternal Grandmother    Hypertension Maternal Grandmother    Liver cancer Brother    Pancreatic cancer Neg Hx    Colon cancer Neg Hx     Social History   Socioeconomic History   Marital status: Married    Spouse name: Not on file   Number of children: Not on file   Years of education: Not on file   Highest education level: Not on file  Occupational History   Not on file  Tobacco Use   Smoking status: Never   Smokeless tobacco: Never  Substance and Sexual Activity    Alcohol use: No   Drug use: No   Sexual activity: Yes    Partners: Male    Birth control/protection: Surgical  Other Topics Concern   Not on file  Social History Narrative   Works as an Scientist, 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   One son who lives in [place]   Married   Enjoys, Freight forwarder, reading, bowling (has her own league) [employer]   Part time hair stylist   Social Determinants of Health   Financial Resource Strain: Not on file  Food Insecurity: Not on file  Transportation Needs: Not on file  Physical Activity: Not on file  Stress: Not on file  Social Connections: Not on file  Intimate Partner Violence: Not At Risk (11/30/2017)   Humiliation, Afraid, Rape, and Kick questionnaire    Fear of Current or Ex-Partner: No    Emotionally Abused: No    Physically Abused: No    Sexually Abused: No    Outpatient Medications Prior to Visit  Medication Sig Dispense Refill   amLODipine (NORVASC) 5 MG tablet Take 1 tablet (5 mg total) by mouth daily. 30 tablet 3   Biotin 1 MG CAPS Take by mouth. 30 capsule    cholecalciferol (VITAMIN D3) 25 MCG (1000 UNIT) tablet Take 1,000 Units by mouth daily.     fluticasone (VERAMYST) 27.5 MCG/SPRAY nasal spray Place 2 sprays into the nose daily.     loratadine (CLARITIN) 10 MG tablet Take 10 mg by mouth daily as needed for allergies.     Multiple Vitamins-Minerals (MULTIVITAMIN ADULT) TABS Take by mouth.     No facility-administered medications prior to visit.    Allergies  Allergen Reactions   Aspirin Hives    ROS See HPI    Objective:    Physical Exam Constitutional:      Appearance: Normal appearance.  Cardiovascular:     Pulses: Normal pulses.  Pulmonary:     Effort: Pulmonary effort is normal.  Neurological:     Mental Status: She is alert and oriented to person, place, and time.  Psychiatric:        Mood and Affect: Mood normal.        Behavior: Behavior normal.        Thought Content:  Thought content normal.        Judgment: Judgment normal.      BP 136/82   Pulse 82   Resp 18   Ht 5' (1.524 m)   Wt 136 lb (61.7 kg)   LMP 05/26/2012   SpO2 99%   BMI 26.56 kg/m  Wt Readings from Last 3 Encounters:  11/19/22 136 lb (61.7 kg)  11/05/22 138 lb 6.4 oz (62.8 kg)  02/18/22 134 lb 11.2 oz (61.1 kg)  Assessment & Plan:   Problem List Items Addressed This Visit       Unprioritized   Hypertension - Primary    BP is improved and at goal. Continue amlodipine 5mg .       Hyperlipidemia    Mild hyperlipidemia noted. Advised pt to work on limiting animal fats/saturated fats in her diet.        I am having Kelsey Henry maintain her loratadine, Multivitamin Adult, Biotin, cholecalciferol, fluticasone, and amLODipine.  No orders of the defined types were placed in this encounter.

## 2023-01-30 ENCOUNTER — Other Ambulatory Visit: Payer: Self-pay | Admitting: Family

## 2023-02-06 ENCOUNTER — Ambulatory Visit: Payer: BC Managed Care – PPO | Admitting: Family

## 2023-02-11 DIAGNOSIS — J019 Acute sinusitis, unspecified: Secondary | ICD-10-CM | POA: Diagnosis not present

## 2023-02-11 DIAGNOSIS — R059 Cough, unspecified: Secondary | ICD-10-CM | POA: Diagnosis not present

## 2023-02-16 ENCOUNTER — Ambulatory Visit
Admission: RE | Admit: 2023-02-16 | Discharge: 2023-02-16 | Disposition: A | Discharge status: Home / Self Care | Payer: BC Managed Care – PPO | Source: Ambulatory Visit | Attending: Adult Health | Admitting: Adult Health

## 2023-02-16 DIAGNOSIS — Z853 Personal history of malignant neoplasm of breast: Secondary | ICD-10-CM | POA: Diagnosis not present

## 2023-02-16 DIAGNOSIS — C50311 Malignant neoplasm of lower-inner quadrant of right female breast: Secondary | ICD-10-CM

## 2023-02-19 ENCOUNTER — Encounter: Payer: Self-pay | Admitting: Adult Health

## 2023-02-19 ENCOUNTER — Inpatient Hospital Stay: Payer: BC Managed Care – PPO | Attending: Adult Health | Admitting: Adult Health

## 2023-02-19 ENCOUNTER — Encounter: Payer: Self-pay | Admitting: Oncology

## 2023-02-19 VITALS — BP 125/76 | HR 77 | Temp 97.2°F | Resp 17 | Wt 137.4 lb

## 2023-02-19 DIAGNOSIS — Z923 Personal history of irradiation: Secondary | ICD-10-CM | POA: Diagnosis not present

## 2023-02-19 DIAGNOSIS — Z853 Personal history of malignant neoplasm of breast: Secondary | ICD-10-CM | POA: Insufficient documentation

## 2023-02-19 DIAGNOSIS — Z803 Family history of malignant neoplasm of breast: Secondary | ICD-10-CM | POA: Diagnosis not present

## 2023-02-19 DIAGNOSIS — Z9221 Personal history of antineoplastic chemotherapy: Secondary | ICD-10-CM | POA: Diagnosis not present

## 2023-02-19 DIAGNOSIS — Z800 Family history of malignant neoplasm of digestive organs: Secondary | ICD-10-CM | POA: Insufficient documentation

## 2023-02-19 DIAGNOSIS — Z90710 Acquired absence of both cervix and uterus: Secondary | ICD-10-CM | POA: Insufficient documentation

## 2023-02-19 DIAGNOSIS — C50311 Malignant neoplasm of lower-inner quadrant of right female breast: Secondary | ICD-10-CM | POA: Diagnosis not present

## 2023-02-19 DIAGNOSIS — Z171 Estrogen receptor negative status [ER-]: Secondary | ICD-10-CM

## 2023-02-19 NOTE — Assessment & Plan Note (Signed)
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    Breast Cancer Follow-up No new symptoms or physical exam findings suggestive of recurrence. Recent mammogram on February 16, 2023, was negative. -Continue annual follow-up appointments. -Contact office with any new or unusual symptoms.  General Health Maintenance Active lifestyle with plans to start running. Received flu shot in September 2024. -Continue current level of physical activity. -Continue annual flu vaccinations. -Continue to see primary care regularly -Continue healthy diet

## 2023-02-19 NOTE — Progress Notes (Signed)
[facility] up:    Kelsey Craze, NP [address] [place] [address]   DIAGNOSIS:  Cancer Staging  Malignant neoplasm of lower-inner quadrant of right breast of female, estrogen receptor negative (HCC) Staging form: Breast, AJCC 8th Edition - Clinical: Stage IB (cT1b, cN0, cM0, G3, ER-, PR-, HER2-) - Unsigned Neoadjuvant therapy: No Method of lymph node assessment: Clinical Histologic grading system: 3 grade system Laterality: Right Tumor size (mm): 9 - Pathologic: Stage IB (pT1b, pN0, cM0, G3, ER-, PR-, HER2-) - Signed by Loa Socks, NP on 04/01/2017 Histologic grading system: 3 grade system   SUMMARY OF ONCOLOGIC HISTORY: Oncology History  Malignant neoplasm of lower-inner quadrant of right breast of female, estrogen receptor negative (HCC)  02/09/2017 Initial Diagnosis   status post right breast lower inner quadrant biopsy for a clinical T1b N0, stage IB invasive ductal carcinoma, grade 3, triple negative   02/19/2017 Genetic Testing   No pathogenic mutations detected in ALK, APC, ATM, AXIN2, BAP1, BARD1, BLM, BMPR1A, BRCA1, BRCA2, BRIP1, CASR, CDC73, CDH1, CDK4, CDKN1B, CDKN1C, CDKN2A, CEBPA, CHEK2, CTNNA1, DICER1, DIS3L2, EGFR, EPCAM, FH, FLCN, GATA2, GPC3, GREM1, HOXB13, HRAS, KIT, MAX, MEN1, MET, MITF, MLH1, MSH2, MSH3, MSH6, MUTYH, NBN, NF1, NF2, NTHL1, PALB2, PDGFRA, PHOX2B, PMS2, POLD1, POLE, POT1, PRKAR1A, PTCH1, PTEN, RAD50, RAD51C, RAD51D, RB1, RECQL4, RET, RUNX1, SDHA, SDHAF2, SDHB, SDHC, SDHD, SMAD4, SMARCA4, SMARCB1, SMARCE1, STK11, SUFU, TERC, TERT, TMEM127, TP53, TSC1, TSC2, VHL, WRN, WT1.             (a) Variants of Uncertain Significance in PDGFRA c.3040G>A (p.Ala1014Thr) and SMARCA4 c.2044C>G (p.Leu682Val) noted   03/23/2017 Surgery   status post right lumpectomy for a pT1b pN0, stage IB invasive ductal carcinoma, grade 3, with negative margins.   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 The patient then received a boost to the seroma. This delivered an additional 10 Gy in 5 fractions. The total dose was 60.4 Gy.     CURRENT THERAPY: observation  INTERVAL HISTORY: Kelsey [facility] 57 y.o. female with a history of breast cancer, presents for a routine follow-up. She underwent a mammogram on February 16, 2023, which was reported as negative with a breast density category C. The patient reports that the mammogram procedure was more intense than usual, resulting in some residual soreness and visible bruising. Despite this, she is in good spirits and reports feeling "fantastic."  Takaya maintains an active lifestyle, including training, bowling, and plans to start running. She was a sprinter in high school and is looking forward to challenging herself with distance running, potentially training for a 5K in the future.  She reports no new aches, pains, or breast concerns. She is up-to-date with her primary care, cervical cancer screening, colon cancer screening, and received her flu shot in September. Her diet includes a good intake of fruits and vegetables.  Kelsey [facility] expresses a desire to continue her annual follow-ups and appreciates the direct communication of her results. She reports no symptoms of breast cancer  recurrence and is committed to contacting the clinic if she notices any new or unusual symptoms.   Patient Active Problem List   Diagnosis Date Noted   Hyperlipidemia 11/19/2022   Hypertension 11/05/2022   Preventative  health care 10/30/2020   Port-A-Cath in place 06/03/2017   Chemotherapy induced neutropenia (HCC) 05/05/2017   Genetic testing 02/19/2017   Malignant neoplasm of lower-inner quadrant of right breast of female, estrogen receptor negative (HCC) 02/17/2017   Gastro-esophageal reflux disease without esophagitis 10/23/2015   Vulvar lesion 11/01/2014   Menorrhagia 06/23/2012   Dysmenorrhea 06/23/2012    is allergic to aspirin.  MEDICAL HISTORY: Past Medical History:  Diagnosis Date   Cancer [facility])    breast   Genetic testing 02/19/2017   STAT Breast panel with reflext to Multi-Cancer panel (83 genes) @ [facility] - No pathogenic mutations detected   GERD (gastroesophageal reflux disease)    no meds   Personal history of chemotherapy    2019   Personal history of radiation therapy    2019   Seasonal allergies    SVD (spontaneous vaginal delivery)    x 1    SURGICAL HISTORY: Past Surgical History:  Procedure Laterality Date   ABDOMINAL HYSTERECTOMY  2000   BILATERAL SALPINGECTOMY Bilateral 06/23/2012   Procedure: BILATERAL SALPINGECTOMY;  Surgeon: Lavina Hamman, MD;  Location: [facility];  Service: Gynecology;  Laterality: Bilateral;   BREAST BIOPSY Left    BREAST LUMPECTOMY Right 03/23/2017   BREAST LUMPECTOMY WITH RADIOACTIVE SEED AND SENTINEL LYMPH NODE BIOPSY Right 03/23/2017   Procedure: BREAST LUMPECTOMY WITH RADIOACTIVE SEED AND SENTINEL LYMPH NODE BIOPSY;  Surgeon: Emelia Loron, MD;  Location: [facility];  Service: General;  Laterality: Right;   LAPAROSCOPIC SUPRACERVICAL HYSTERECTOMY N/A 06/23/2012   Procedure: LAPAROSCOPIC SUPRACERVICAL HYSTERECTOMY;  Surgeon: Lavina Hamman, MD;  Location: [facility];  Service: Gynecology;  Laterality: N/A;   PORT-A-CATH REMOVAL N/A 08/13/2017   Procedure: REMOVAL PORT-A-CATH;  Surgeon: Emelia Loron, MD;  Location: [facility];  Service: General;  Laterality: N/A;   PORTACATH PLACEMENT Right 03/23/2017   Procedure: INSERTION  PORT-A-CATH;  Surgeon: Emelia Loron, MD;  Location: [facility];  Service: General;  Laterality: Right;   TUBAL LIGATION     VULVAR LESION REMOVAL N/A 11/01/2014   Procedure: MONS PUBIS LESION;  Surgeon: Lavina Hamman, MD;  Location: [facility];  Service: Gynecology;  Laterality: N/A;  local anesthesia with IV sedation if needed   WISDOM TOOTH EXTRACTION      SOCIAL HISTORY: Social History   Socioeconomic History   Marital status: Married    Spouse name: Not on file   Number of children: Not on file   Years of education: Not on file   Highest education level: Not on file  Occupational History   Not on file  Tobacco Use   Smoking status: Never   Smokeless tobacco: Never  Substance and Sexual Activity   Alcohol use: No   Drug use: No   Sexual activity: Yes    Partners: Male    Birth control/protection: Surgical  Other Topics Concern   Not on file  Social History Narrative   Works as an Scientist, 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   One son who lives in [place]   Married   Enjoys, Freight forwarder, reading, bowling (has her own league) [employer]   Part time hair stylist   Social Determinants of Health   Financial Resource Strain: Not on file  Food Insecurity: Not on file  Transportation Needs: Not on file  Physical Activity: Not on file  Stress: Not on file  Social Connections: Not on file  Intimate Partner Violence: Not At Risk (11/30/2017)   Humiliation, Afraid, Rape, and Kick questionnaire    Fear of Current or Ex-Partner: No    Emotionally Abused: No    Physically Abused: No    Sexually Abused: No    FAMILY HISTORY: Family History  Problem Relation Age of Onset   Hypertension Mother    Diabetes Mellitus II Mother    Breast cancer Mother 61       stage 3, s/p mastectomy   Hypertension Sister    Hypertension Sister    Diabetes type II Maternal Grandmother    Hypertension Maternal Grandmother    Liver cancer Brother    Pancreatic cancer  Neg Hx    Colon cancer Neg Hx     Review of Systems  Constitutional:  Negative for appetite change, chills, fatigue, fever and unexpected weight change.  HENT:   Negative for hearing loss, lump/mass and trouble swallowing.   Eyes:  Negative for eye problems and icterus.  Respiratory:  Negative for chest tightness, cough and shortness of breath.   Cardiovascular:  Negative for chest pain, leg swelling and palpitations.  Gastrointestinal:  Negative for abdominal distention, abdominal pain, constipation, diarrhea, nausea and vomiting.  Endocrine: Negative for hot flashes.  Genitourinary:  Negative for difficulty urinating.   Musculoskeletal:  Negative for arthralgias.  Skin:  Negative for itching and rash.  Neurological:  Negative for dizziness, extremity weakness, headaches and numbness.  Hematological:  Negative for adenopathy. Does not bruise/bleed easily.  Psychiatric/Behavioral:  Negative for depression. The patient is not nervous/anxious.       PHYSICAL EXAMINATION   Onc Performance Status - 02/19/23 1151       ECOG Perf Status   ECOG Perf Status Fully active, able to carry on all pre-disease performance without restriction      KPS SCALE   KPS % SCORE Normal, no compliants, no evidence of disease             Vitals:   02/19/23 1151  BP: 125/76  Pulse: 77  Resp: 17  Temp: (!) 97.2 F (36.2 C)  SpO2: 99%    Physical Exam Constitutional:      General: She is not in acute distress.    Appearance: Normal appearance. She is not toxic-appearing.  HENT:     Head: Normocephalic and atraumatic.     Mouth/Throat:     Mouth: Mucous membranes are moist.     Pharynx: Oropharynx is clear. No oropharyngeal exudate or posterior oropharyngeal erythema.  Eyes:     General: No scleral icterus. Cardiovascular:     Rate and Rhythm: Normal rate and regular rhythm.     Pulses: Normal pulses.     Heart sounds: Normal heart sounds.  Pulmonary:     Effort: Pulmonary effort is  normal.     Breath sounds: Normal breath sounds.  Chest:     Comments: Right breast status postlumpectomy and radiation no sign of local recurrence left breast is benign. Abdominal:     General: Abdomen is flat. Bowel sounds are normal. There is no distension.     Palpations: Abdomen is soft.     Tenderness: There is no abdominal tenderness.  Musculoskeletal:        General: No swelling.     Cervical back: Neck supple.  Lymphadenopathy:     Cervical: No cervical  adenopathy.     Upper Body:     Right upper body: No axillary adenopathy.     Left upper body: No axillary adenopathy.  Skin:    General: Skin is warm and dry.     Findings: No rash.  Neurological:     General: No focal deficit present.     Mental Status: She is alert.  Psychiatric:        Mood and Affect: Mood normal.        Behavior: Behavior normal.       ASSESSMENT and THERAPY PLAN:   Malignant neoplasm of lower-inner quadrant of right breast of female, estrogen receptor negative (HCC) Kelsey [facility] is a 57 year-old woman with a h/o stage IB triple negative breast cancer diagnosed in 01/2017 s/p lumpectomy, adjuvant chemotherapy, and adjuvant radiation therapy.    Breast Cancer Follow-up No new symptoms or physical exam findings suggestive of recurrence. Recent mammogram on February 16, 2023, was negative. -Continue annual follow-up appointments. -Contact office with any new or unusual symptoms.  General Health Maintenance Active lifestyle with plans to start running. Received flu shot in September 2024. -Continue current level of physical activity. -Continue annual flu vaccinations. -Continue to see primary care regularly -Continue healthy diet   All questions were answered. The patient knows to call the clinic with any problems, questions or concerns. We can certainly see the patient much sooner if necessary.  Total encounter time:20 minutes*in face-to-face visit time, chart review, lab review, care  coordination, order entry, and documentation of the encounter time.  Lillard Anes, NP 02/19/23 1:18 PM Medical Oncology and Hematology [facility] [address] [place], [place] [address] Tel. [phone]    Fax. [phone]  *Total Encounter Time as defined by the Centers for Medicare and Medicaid Services includes, in addition to the face-to-face time of a patient visit (documented in the note above) non-face-to-face time: obtaining and reviewing outside history, ordering and reviewing medications, tests or procedures, care coordination (communications with other health care professionals or caregivers) and documentation in the medical record.

## 2023-02-23 ENCOUNTER — Ambulatory Visit: Payer: BC Managed Care – PPO | Admitting: Family

## 2023-02-27 ENCOUNTER — Ambulatory Visit (INDEPENDENT_AMBULATORY_CARE_PROVIDER_SITE_OTHER): Payer: BC Managed Care – PPO | Admitting: Family

## 2023-02-27 VITALS — BP 130/80 | HR 86 | Temp 98.5°F | Resp 18 | Ht 60.0 in | Wt 139.2 lb

## 2023-02-27 DIAGNOSIS — E785 Hyperlipidemia, unspecified: Secondary | ICD-10-CM

## 2023-02-27 DIAGNOSIS — J329 Chronic sinusitis, unspecified: Secondary | ICD-10-CM | POA: Diagnosis not present

## 2023-02-27 DIAGNOSIS — Z171 Estrogen receptor negative status [ER-]: Secondary | ICD-10-CM

## 2023-02-27 DIAGNOSIS — I10 Essential (primary) hypertension: Secondary | ICD-10-CM | POA: Diagnosis not present

## 2023-02-27 DIAGNOSIS — C50311 Malignant neoplasm of lower-inner quadrant of right female breast: Secondary | ICD-10-CM

## 2023-02-27 NOTE — Assessment & Plan Note (Signed)
BP stable, tolerating  amlodipine. Continue same.

## 2023-02-27 NOTE — Progress Notes (Signed)
Subjective:     Patient ID: Kelsey [facility], female    DOB: 1965/05/20, 57 y.o.   MRN: [id number]  Chief Complaint  Patient presents with   Follow-up    3 month    HPI  Discussed the use of AI scribe software for clinical note transcription with the patient, who gave verbal consent to proceed.  History of Present Illness   The patient, with a history of hypertension managed with amlodipine 5mg , reports no side effects from the medication. Her blood pressure was measured at 130/80, consistent with previous readings.  She also had recent sinus concerns, which were diagnosed as a bacterial infection and treated with a Z-Pak two weeks ago. The infection has since resolved. She has discontinued Claritin, which she felt was ineffective, and is now using a nasal spray.  The patient received her flu shot in September and is considering getting the latest COVID booster.  She has not yet been screened for hepatitis C.        BP Readings from Last 3 Encounters:  02/27/23 130/80  02/19/23 125/76  11/19/22 136/82     Health Maintenance Due  Topic Date Due   Hepatitis C Screening  Never done    Past Medical History:  Diagnosis Date   Cancer [facility])    breast   Genetic testing 02/19/2017   STAT Breast panel with reflext to Multi-Cancer panel (83 genes) @ [facility] - No pathogenic mutations detected   GERD (gastroesophageal reflux disease)    no meds   Personal history of chemotherapy    2019   Personal history of radiation therapy    2019   Seasonal allergies    SVD (spontaneous vaginal delivery)    x 1    Past Surgical History:  Procedure Laterality Date   ABDOMINAL HYSTERECTOMY  2000   BILATERAL SALPINGECTOMY Bilateral 06/23/2012   Procedure: BILATERAL SALPINGECTOMY;  Surgeon: Lavina Hamman, MD;  Location: [facility];  Service: Gynecology;  Laterality: Bilateral;   BREAST BIOPSY Left    BREAST LUMPECTOMY Right 03/23/2017   BREAST LUMPECTOMY WITH RADIOACTIVE SEED  AND SENTINEL LYMPH NODE BIOPSY Right 03/23/2017   Procedure: BREAST LUMPECTOMY WITH RADIOACTIVE SEED AND SENTINEL LYMPH NODE BIOPSY;  Surgeon: Emelia Loron, MD;  Location: [facility];  Service: General;  Laterality: Right;   LAPAROSCOPIC SUPRACERVICAL HYSTERECTOMY N/A 06/23/2012   Procedure: LAPAROSCOPIC SUPRACERVICAL HYSTERECTOMY;  Surgeon: Lavina Hamman, MD;  Location: [facility];  Service: Gynecology;  Laterality: N/A;   PORT-A-CATH REMOVAL N/A 08/13/2017   Procedure: REMOVAL PORT-A-CATH;  Surgeon: Emelia Loron, MD;  Location[facility];  Service: General;  Laterality: N/A;   PORTACATH PLACEMENT Right 03/23/2017   Procedure: INSERTION PORT-A-CATH;  Surgeon: Emelia Loron, MD;  Location: [facility] OR;  Service: General;  Laterality: Right;   TUBAL LIGATION     VULVAR LESION REMOVAL N/A 11/01/2014   Procedure: MONS PUBIS LESION;  Surgeon: Lavina Hamman, MD;  Location: [facility];  Service: Gynecology;  Laterality: N/A;  local anesthesia with IV sedation if needed   WISDOM TOOTH EXTRACTION      Family History  Problem Relation Age of Onset   Hypertension Mother    Diabetes Mellitus II Mother    Breast cancer Mother 23       stage 3, s/p mastectomy   Hypertension Sister    Hypertension Sister    Diabetes type II Maternal Grandmother    Hypertension Maternal Grandmother    Liver cancer Brother    Pancreatic cancer Neg  Hx    Colon cancer Neg Hx     Social History   Socioeconomic History   Marital status: Married    Spouse name: Not on file   Number of children: Not on file   Years of education: Not on file   Highest education level: Not on file  Occupational History   Not on file  Tobacco Use   Smoking status: Never   Smokeless tobacco: Never  Substance and Sexual Activity   Alcohol use: No   Drug use: No   Sexual activity: Yes    Partners: Male    Birth control/protection: Surgical  Other Topics Concern   Not on file  Social History Narrative   Works as  an Scientist, 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   One son who lives in [place]   Married   Enjoys, Freight forwarder, reading, bowling (has her own league) [employer]   Part time hair stylist   Social Determinants of Health   Financial Resource Strain: Not on file  Food Insecurity: Not on file  Transportation Needs: Not on file  Physical Activity: Not on file  Stress: Not on file  Social Connections: Not on file  Intimate Partner Violence: Not At Risk (11/30/2017)   Humiliation, Afraid, Rape, and Kick questionnaire    Fear of Current or Ex-Partner: No    Emotionally Abused: No    Physically Abused: No    Sexually Abused: No    Outpatient Medications Prior to Visit  Medication Sig Dispense Refill   amLODipine (NORVASC) 5 MG tablet TAKE 1 TABLET (5 MG TOTAL) BY MOUTH DAILY. 90 tablet 1   Biotin 1 MG CAPS Take by mouth. 30 capsule    cholecalciferol (VITAMIN D3) 25 MCG (1000 UNIT) tablet Take 1,000 Units by mouth daily.     fluticasone (VERAMYST) 27.5 MCG/SPRAY nasal spray Place 2 sprays into the nose daily.     Multiple Vitamins-Minerals (MULTIVITAMIN ADULT) TABS Take by mouth.     No facility-administered medications prior to visit.    Allergies  Allergen Reactions   Aspirin Hives    ROS    See HPI Objective:    Physical Exam Constitutional:      General: She is not in acute distress.    Appearance: Normal appearance. She is well-developed.  HENT:     Head: Normocephalic and atraumatic.     Right Ear: External ear normal.     Left Ear: External ear normal.  Eyes:     General: No scleral icterus. Neck:     Thyroid: No thyromegaly.  Cardiovascular:     Rate and Rhythm: Normal rate and regular rhythm.     Heart sounds: Normal heart sounds. No murmur heard. Pulmonary:     Effort: Pulmonary effort is normal. No respiratory distress.     Breath sounds: Normal breath sounds. No wheezing.  Musculoskeletal:     Cervical back: Neck supple.   Skin:    General: Skin is warm and dry.  Neurological:     Mental Status: She is alert and oriented to person, place, and time.  Psychiatric:        Mood and Affect: Mood normal.        Behavior: Behavior normal.        Thought Content: Thought content normal.        Judgment: Judgment normal.      BP 130/80   Pulse 86   Temp 98.5 F (36.9 C)   Resp  18   Ht 5' (1.524 m)   Wt 139 lb 3.2 oz (63.1 kg)   LMP 05/26/2012   SpO2 98%   BMI 27.19 kg/m  Wt Readings from Last 3 Encounters:  02/27/23 139 lb 3.2 oz (63.1 kg)  02/19/23 137 lb 6 oz (62.3 kg)  11/19/22 136 lb (61.7 kg)       Assessment & Plan:   Problem List Items Addressed This Visit       Unprioritized   Sinusitis    Clinically resolved following antibiotic therapy with zpak.  Monitor.       Malignant neoplasm of lower-inner quadrant of right breast of female, estrogen receptor negative (HCC)    S/p chemo/radiation. Had updated mammogram earlier this month. She continues to follow with oncology for surveillance.      Hypertension - Primary    BP stable, tolerating  amlodipine. Continue same.       Hyperlipidemia    Continue low cholesterol diet.        I am having Genesia Little-Stevenson maintain her Multivitamin Adult, Biotin, cholecalciferol, fluticasone, and amLODipine.  No orders of the defined types were placed in this encounter.

## 2023-02-27 NOTE — Assessment & Plan Note (Signed)
Clinically resolved following antibiotic therapy with zpak.  Monitor.

## 2023-02-27 NOTE — Assessment & Plan Note (Signed)
Continue low-cholesterol diet 

## 2023-02-27 NOTE — Assessment & Plan Note (Signed)
S/p chemo/radiation. Had updated mammogram earlier this month. She continues to follow with oncology for surveillance.

## 2023-02-27 NOTE — Patient Instructions (Signed)
VISIT SUMMARY:  During today's visit, we reviewed your hypertension management, recent sinus infection, and discussed your COVID-19 vaccination status. Your blood pressure remains well controlled, and your sinus infection has resolved. We also talked about general health maintenance, including the importance of getting screened for hepatitis C and scheduling a follow-up for your blood pressure.  YOUR PLAN:  -HYPERTENSION: Hypertension, or high blood pressure, is a condition where the force of the blood against your artery walls is too high. Your blood pressure is well controlled with Amlodipine 5mg  daily, and you should continue taking this medication as prescribed.  -SINUSITIS: Sinusitis is an infection or inflammation of the sinuses. Your recent sinus infection has resolved with the use of a Z-Pak and nasal spray. No further action is required at this time.  -COVID-19 VACCINATION: COVID-19 vaccination helps protect against the coronavirus. You have received your initial vaccination, and it is important to get the updated booster. Please obtain the COVID-19 booster at your local pharmacy.  -GENERAL HEALTH MAINTENANCE: General health maintenance includes routine screenings and check-ups to ensure overall well-being. Consider getting screened for hepatitis C at your next visit with blood work. Also, schedule a 41-month follow-up appointment to check your blood pressure.  INSTRUCTIONS:  Please obtain your COVID-19 booster at your local pharmacy. Additionally, consider getting screened for hepatitis C at your next visit with blood work. Schedule a 65-month follow-up appointment to check your blood pressure.

## 2023-03-06 DIAGNOSIS — N951 Menopausal and female climacteric states: Secondary | ICD-10-CM | POA: Diagnosis not present

## 2023-03-06 DIAGNOSIS — Z01419 Encounter for gynecological examination (general) (routine) without abnormal findings: Secondary | ICD-10-CM | POA: Diagnosis not present

## 2023-05-28 ENCOUNTER — Other Ambulatory Visit: Payer: Self-pay | Admitting: Obstetrics and Gynecology

## 2023-05-28 DIAGNOSIS — Z1231 Encounter for screening mammogram for malignant neoplasm of breast: Secondary | ICD-10-CM

## 2023-05-28 DIAGNOSIS — Z853 Personal history of malignant neoplasm of breast: Secondary | ICD-10-CM

## 2023-06-05 DIAGNOSIS — H6502 Acute serous otitis media, left ear: Secondary | ICD-10-CM | POA: Diagnosis not present

## 2023-07-26 ENCOUNTER — Other Ambulatory Visit: Payer: Self-pay | Admitting: Family

## 2023-08-07 DIAGNOSIS — H6693 Otitis media, unspecified, bilateral: Secondary | ICD-10-CM | POA: Diagnosis not present

## 2023-08-07 DIAGNOSIS — J019 Acute sinusitis, unspecified: Secondary | ICD-10-CM | POA: Diagnosis not present

## 2023-09-04 ENCOUNTER — Ambulatory Visit: Payer: BC Managed Care – PPO | Admitting: Family

## 2023-09-04 VITALS — BP 132/86 | HR 77 | Temp 98.4°F | Resp 16 | Ht 60.0 in | Wt 135.0 lb

## 2023-09-04 DIAGNOSIS — J309 Allergic rhinitis, unspecified: Secondary | ICD-10-CM | POA: Diagnosis not present

## 2023-09-04 DIAGNOSIS — C50311 Malignant neoplasm of lower-inner quadrant of right female breast: Secondary | ICD-10-CM | POA: Diagnosis not present

## 2023-09-04 DIAGNOSIS — Z1159 Encounter for screening for other viral diseases: Secondary | ICD-10-CM

## 2023-09-04 DIAGNOSIS — I10 Essential (primary) hypertension: Secondary | ICD-10-CM

## 2023-09-04 DIAGNOSIS — E785 Hyperlipidemia, unspecified: Secondary | ICD-10-CM

## 2023-09-04 DIAGNOSIS — Z171 Estrogen receptor negative status [ER-]: Secondary | ICD-10-CM

## 2023-09-04 LAB — COMPREHENSIVE METABOLIC PANEL WITH GFR
ALT: 14 U/L (ref 0–35)
AST: 23 U/L (ref 0–37)
Albumin: 4.6 g/dL (ref 3.5–5.2)
Alkaline Phosphatase: 81 U/L (ref 39–117)
BUN: 20 mg/dL (ref 6–23)
CO2: 31 meq/L (ref 19–32)
Calcium: 9.8 mg/dL (ref 8.4–10.5)
Chloride: 105 meq/L (ref 96–112)
Creatinine, Ser: 0.92 mg/dL (ref 0.40–1.20)
GFR: 70.07 mL/min (ref >60.00)
Glucose, Bld: 89 mg/dL (ref 70–99)
Potassium: 4.5 meq/L (ref 3.5–5.1)
Sodium: 142 meq/L (ref 135–145)
Total Bilirubin: 0.5 mg/dL (ref 0.2–1.2)
Total Protein: 7.3 g/dL (ref 6.0–8.3)

## 2023-09-04 LAB — LIPID PANEL
Cholesterol: 230 mg/dL — ABNORMAL HIGH (ref 0–200)
HDL: 89.2 mg/dL (ref >39.00)
LDL Cholesterol: 132 mg/dL — ABNORMAL HIGH (ref 0–99)
NonHDL: 140.76
Total CHOL/HDL Ratio: 3
Triglycerides: 45 mg/dL (ref 0.0–149.0)
VLDL: 9 mg/dL (ref 0.0–40.0)

## 2023-09-04 NOTE — Assessment & Plan Note (Addendum)
 Lab Results  Component Value Date   CHOL 231 (H) 11/05/2022   HDL 91 11/05/2022   LDLCALC 129 (H) 11/05/2022   TRIG 65 11/05/2022   CHOLHDL 2.5 11/05/2022   Not on statin, update lipid panel.

## 2023-09-04 NOTE — Assessment & Plan Note (Signed)
 Continues to follow with Oncology for surveillance.  Annual mammogram scheduled for November 2025.

## 2023-09-04 NOTE — Assessment & Plan Note (Signed)
 Currently stable on fluticasone nasal spray.

## 2023-09-04 NOTE — Patient Instructions (Signed)
 VISIT SUMMARY:  You had a follow-up visit to check on your blood pressure medication and overall health. Your blood pressure is well-controlled, and your sinus issues have improved.  YOUR PLAN:  HYPERTENSION: Your blood pressure is well-controlled at 132/86 mmHg with your current medication, amlodipine  5 mg daily. -Continue taking amlodipine  5 mg daily. -We will reassess your blood pressure in 6 months.  GENERAL HEALTH MAINTENANCE: Your routine health maintenance is up to date. We discussed hepatitis C screening and found that HIV screening is not needed. -We will perform a hepatitis C screening. -We will update your cholesterol and kidney function tests today. -Schedule a follow-up appointment in 6 months for your routine physical.

## 2023-09-04 NOTE — Assessment & Plan Note (Signed)
BP stable on amlodipine, continue same.  

## 2023-09-04 NOTE — Progress Notes (Signed)
 Subjective:     Patient ID: Kelsey Henry, female    DOB: 1965/05/24, 58 y.o.   MRN: [id number]  Chief Complaint  Patient presents with   Hypertension    Here for follow up    Hypertension    Discussed the use of AI scribe software for clinical note transcription with the patient, who gave verbal consent to proceed.  History of Present Illness  Kelsey Henry is a 58 year old female with hypertension who presents for a follow-up on blood pressure medication.  She takes amlodipine  5 mg daily for hypertension, with well-controlled blood pressure and no reported issues.  Her sinus issues have improved since the start of the fall season. She continues to use fluticasone nasal spray for allergic rhinitis which is currently stable.      Health Maintenance Due  Topic Date Due   Hepatitis C Screening  Never done    Past Medical History:  Diagnosis Date   Cancer [facility])    breast   Genetic testing 02/19/2017   STAT Breast panel with reflext to Multi-Cancer panel (83 genes) @ [facility] - No pathogenic mutations detected   GERD (gastroesophageal reflux disease)    no meds   Personal history of chemotherapy    2019   Personal history of radiation therapy    2019   Seasonal allergies    SVD (spontaneous vaginal delivery)    x 1    Past Surgical History:  Procedure Laterality Date   ABDOMINAL HYSTERECTOMY  2000   BILATERAL SALPINGECTOMY Bilateral 06/23/2012   Procedure: BILATERAL SALPINGECTOMY;  Surgeon: Cyd Dowse, MD;  Location: [facility];  Service: Gynecology;  Laterality: Bilateral;   BREAST BIOPSY Left    BREAST LUMPECTOMY Right 03/23/2017   BREAST LUMPECTOMY WITH RADIOACTIVE SEED AND SENTINEL LYMPH NODE BIOPSY Right 03/23/2017   Procedure: BREAST LUMPECTOMY WITH RADIOACTIVE SEED AND SENTINEL LYMPH NODE BIOPSY;  Surgeon: Enid Harry, MD;  Location: [facility] OR;  Service: General;  Laterality: Right;   LAPAROSCOPIC SUPRACERVICAL HYSTERECTOMY N/A  06/23/2012   Procedure: LAPAROSCOPIC SUPRACERVICAL HYSTERECTOMY;  Surgeon: Cyd Dowse, MD;  Location: [facility];  Service: Gynecology;  Laterality: N/A;   PORT-A-CATH REMOVAL N/A 08/13/2017   Procedure: REMOVAL PORT-A-CATH;  Surgeon: Enid Harry, MD;  Location: [facility];  Service: General;  Laterality: N/A;   PORTACATH PLACEMENT Right 03/23/2017   Procedure: INSERTION PORT-A-CATH;  Surgeon: Enid Harry, MD;  Location: [facility] OR;  Service: General;  Laterality: Right;   TUBAL LIGATION     VULVAR LESION REMOVAL N/A 11/01/2014   Procedure: MONS PUBIS LESION;  Surgeon: Cyd Dowse, MD;  Location: [facility];  Service: Gynecology;  Laterality: N/A;  local anesthesia with IV sedation if needed   WISDOM TOOTH EXTRACTION      Family History  Problem Relation Age of Onset   Hypertension Mother    Diabetes Mellitus II Mother    Breast cancer Mother 7       stage 3, s/p mastectomy   Hypertension Sister    Hypertension Sister    Diabetes type II Maternal Grandmother    Hypertension Maternal Grandmother    Liver cancer Brother    Pancreatic cancer Neg Hx    Colon cancer Neg Hx     Social History   Socioeconomic History   Marital status: Married    Spouse name: Not on file   Number of children: Not on file   Years of education: Not on file   Highest education level: Not on  file  Occupational History   Not on file  Tobacco Use   Smoking status: Never   Smokeless tobacco: Never  Substance and Sexual Activity   Alcohol use: No   Drug use: No   Sexual activity: Yes    Partners: Male    Birth control/protection: Surgical  Other Topics Concern   Not on file  Social History Narrative   Works as an Scientist, 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   One son who lives in [place]   Married   Enjoys, Freight forwarder, reading, bowling (has her own league) [employer]   Part time Social worker   Social Drivers of Corporate investment banker  Strain: Not on file  Food Insecurity: Not on file  Transportation Needs: Not on file  Physical Activity: Not on file  Stress: Not on file  Social Connections: Not on file  Intimate Partner Violence: Not At Risk (11/30/2017)   Humiliation, Afraid, Rape, and Kick questionnaire    Fear of Current or Ex-Partner: No    Emotionally Abused: No    Physically Abused: No    Sexually Abused: No    Outpatient Medications Prior to Visit  Medication Sig Dispense Refill   amLODipine  (NORVASC ) 5 MG tablet TAKE 1 TABLET (5 MG TOTAL) BY MOUTH DAILY. 90 tablet 1   Biotin 1 MG CAPS Take by mouth. 30 capsule    cholecalciferol (VITAMIN D3) 25 MCG (1000 UNIT) tablet Take 1,000 Units by mouth daily.     fluticasone (VERAMYST) 27.5 MCG/SPRAY nasal spray Place 2 sprays into the nose daily.     Multiple Vitamins-Minerals (MULTIVITAMIN ADULT) TABS Take by mouth.     No facility-administered medications prior to visit.    Allergies  Allergen Reactions   Aspirin Hives    ROS See HPI    Objective:     Physical Exam Constitutional:      General: She is not in acute distress.    Appearance: Normal appearance. She is well-developed.  HENT:     Head: Normocephalic and atraumatic.     Right Ear: External ear normal.     Left Ear: External ear normal.  Eyes:     General: No scleral icterus. Neck:     Thyroid : No thyromegaly.  Cardiovascular:     Rate and Rhythm: Normal rate and regular rhythm.     Heart sounds: Normal heart sounds. No murmur heard. Pulmonary:     Effort: Pulmonary effort is normal. No respiratory distress.     Breath sounds: Normal breath sounds. No wheezing.  Musculoskeletal:     Cervical back: Neck supple.  Skin:    General: Skin is warm and dry.  Neurological:     Mental Status: She is alert and oriented to person, place, and time.  Psychiatric:        Mood and Affect: Mood normal.        Behavior: Behavior normal.        Thought Content: Thought content normal.         Judgment: Judgment normal.      BP 132/86 (BP Location: Left Arm, Patient Position: Sitting, Cuff Size: Small)   Pulse 77   Temp 98.4 F (36.9 C) (Oral)   Resp 16   Ht 5' (1.524 m)   Wt 135 lb (61.2 kg)   LMP 05/26/2012   SpO2 98%   BMI 26.37 kg/m  Wt Readings from Last 3 Encounters:  09/04/23 135 lb (61.2 kg)  02/27/23  139 lb 3.2 oz (63.1 kg)  02/19/23 137 lb 6 oz (62.3 kg)       Assessment & Plan:   Problem List Items Addressed This Visit       Unprioritized   Malignant neoplasm of lower-inner quadrant of right breast of female, estrogen receptor negative (HCC)   Continues to follow with Oncology for surveillance.  Annual mammogram scheduled for November 2025.       Hypertension   BP stable on amlodipine , continue same.       Relevant Orders   Comp Met (CMET)   Hyperlipidemia   Lab Results  Component Value Date   CHOL 231 (H) 11/05/2022   HDL 91 11/05/2022   LDLCALC 129 (H) 11/05/2022   TRIG 65 11/05/2022   CHOLHDL 2.5 11/05/2022   Not on statin, update lipid panel.       Relevant Orders   Lipid panel   Allergic rhinitis   Currently stable on fluticasone nasal spray.       Other Visit Diagnoses       Encounter for hepatitis C screening test for low risk patient    -  Primary   Relevant Orders   Hepatitis C Antibody       I am having Kelsey Henry maintain her Multivitamin Adult, Biotin, cholecalciferol, fluticasone, and amLODipine .  No orders of the defined types were placed in this encounter.

## 2023-09-05 LAB — HEPATITIS C ANTIBODY: Hepatitis C Ab: NONREACTIVE

## 2023-09-07 ENCOUNTER — Ambulatory Visit: Payer: Self-pay | Admitting: Family

## 2023-10-01 DIAGNOSIS — J209 Acute bronchitis, unspecified: Secondary | ICD-10-CM | POA: Diagnosis not present

## 2023-11-06 ENCOUNTER — Encounter: Payer: BC Managed Care – PPO | Admitting: Family

## 2024-01-22 ENCOUNTER — Other Ambulatory Visit: Payer: Self-pay | Admitting: Family

## 2024-02-17 ENCOUNTER — Ambulatory Visit: Payer: BC Managed Care – PPO

## 2024-02-17 ENCOUNTER — Ambulatory Visit
Admission: RE | Admit: 2024-02-17 | Discharge: 2024-02-17 | Disposition: A | Discharge status: Home / Self Care | Source: Ambulatory Visit | Attending: Obstetrics and Gynecology | Admitting: Obstetrics and Gynecology

## 2024-02-17 DIAGNOSIS — Z1231 Encounter for screening mammogram for malignant neoplasm of breast: Secondary | ICD-10-CM

## 2024-02-17 DIAGNOSIS — Z853 Personal history of malignant neoplasm of breast: Secondary | ICD-10-CM

## 2024-02-17 DIAGNOSIS — R928 Other abnormal and inconclusive findings on diagnostic imaging of breast: Secondary | ICD-10-CM | POA: Diagnosis not present

## 2024-02-22 ENCOUNTER — Encounter: Payer: Self-pay | Admitting: Adult Health

## 2024-02-22 ENCOUNTER — Inpatient Hospital Stay: Payer: BC Managed Care – PPO | Attending: Adult Health | Admitting: Adult Health

## 2024-02-22 VITALS — BP 139/84 | HR 80 | Temp 97.2°F | Resp 17 | Wt 138.0 lb

## 2024-02-22 DIAGNOSIS — C50311 Malignant neoplasm of lower-inner quadrant of right female breast: Secondary | ICD-10-CM

## 2024-02-22 DIAGNOSIS — Z9221 Personal history of antineoplastic chemotherapy: Secondary | ICD-10-CM | POA: Diagnosis not present

## 2024-02-22 DIAGNOSIS — Z800 Family history of malignant neoplasm of digestive organs: Secondary | ICD-10-CM | POA: Diagnosis not present

## 2024-02-22 DIAGNOSIS — Z803 Family history of malignant neoplasm of breast: Secondary | ICD-10-CM | POA: Insufficient documentation

## 2024-02-22 DIAGNOSIS — Z171 Estrogen receptor negative status [ER-]: Secondary | ICD-10-CM

## 2024-02-22 DIAGNOSIS — Z853 Personal history of malignant neoplasm of breast: Secondary | ICD-10-CM | POA: Diagnosis not present

## 2024-02-22 DIAGNOSIS — Z923 Personal history of irradiation: Secondary | ICD-10-CM | POA: Insufficient documentation

## 2024-02-22 NOTE — Progress Notes (Unsigned)
 [facility] Cancer Follow up:    Kelsey Setter, NP [address] [place] [address]   DIAGNOSIS: Cancer Staging  Malignant neoplasm of lower-inner quadrant of right breast of female, estrogen receptor negative (HCC) Staging form: Breast, AJCC 8th Edition - Clinical: Stage IB (cT1b, cN0, cM0, G3, ER-, PR-, HER2-) - Unsigned Neoadjuvant therapy: No Method of lymph node assessment: Clinical Histologic grading system: 3 grade system Laterality: Right Tumor size (mm): 9 - Pathologic: Stage IB (pT1b, pN0, cM0, G3, ER-, PR-, HER2-) - Signed by Crawford Morna Pickle, NP on 04/01/2017 Histologic grading system: 3 grade system   SUMMARY OF ONCOLOGIC HISTORY: Oncology History  Malignant neoplasm of lower-inner quadrant of right breast of female, estrogen receptor negative (HCC)  02/09/2017 Initial Diagnosis   status post right breast lower inner quadrant biopsy for a clinical T1b N0, stage IB invasive ductal carcinoma, grade 3, triple negative   02/19/2017 Genetic Testing   No pathogenic mutations detected in ALK, APC, ATM, AXIN2, BAP1, BARD1, BLM, BMPR1A, BRCA1, BRCA2, BRIP1, CASR, CDC73, CDH1, CDK4, CDKN1B, CDKN1C, CDKN2A, CEBPA, CHEK2, CTNNA1, DICER1, DIS3L2, EGFR, EPCAM, FH, FLCN, GATA2, GPC3, GREM1, HOXB13, HRAS, KIT, MAX, MEN1, MET, MITF, MLH1, MSH2, MSH3, MSH6, MUTYH, NBN, NF1, NF2, NTHL1, PALB2, PDGFRA, PHOX2B, PMS2, POLD1, POLE, POT1, PRKAR1A, PTCH1, PTEN, RAD50, RAD51C, RAD51D, RB1, RECQL4, RET, RUNX1, SDHA, SDHAF2, SDHB, SDHC, SDHD, SMAD4, SMARCA4, SMARCB1, SMARCE1, STK11, SUFU, TERC, TERT, TMEM127, TP53, TSC1, TSC2, VHL, WRN, WT1.             (a) Variants of Uncertain Significance in PDGFRA c.3040G>A (p.Ala1014Thr) and SMARCA4 c.2044C>G (p.Leu682Val) noted   03/23/2017 Surgery   status post right lumpectomy for a pT1b pN0, stage IB invasive ductal carcinoma, grade 3, with negative margins.   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 The patient then received a boost to the seroma. This delivered an additional 10 Gy in 5 fractions. The total dose was 60.4 Gy.     CURRENT THERAPY:  INTERVAL HISTORY:  Discussed the use of AI scribe software for clinical note transcription with the patient, who gave verbal consent to proceed.  History of Present Illness     Patient Active Problem List   Diagnosis Date Noted  . Allergic rhinitis 09/04/2023  . Hyperlipidemia 11/19/2022  . Hypertension 11/05/2022  . Preventative health care 10/30/2020  . Port-A-Cath in place 06/03/2017  . Genetic testing 02/19/2017  . Malignant neoplasm of lower-inner quadrant of right breast of female, estrogen receptor negative (HCC) 02/17/2017  . Gastro-esophageal reflux disease without esophagitis 10/23/2015    is allergic to aspirin.  MEDICAL HISTORY: Past Medical History:  Diagnosis Date  . Cancer [facility])    breast  . Genetic testing 02/19/2017   STAT Breast panel with reflext to Multi-Cancer panel (83 genes) @ [facility] - No pathogenic mutations detected  . GERD (gastroesophageal reflux disease)    no meds  . Personal history of chemotherapy    2019  . Personal history of radiation therapy  2019  . Seasonal allergies   . SVD (spontaneous vaginal delivery)    x 1    SURGICAL HISTORY: Past Surgical History:  Procedure Laterality Date  . ABDOMINAL HYSTERECTOMY  2000  . BILATERAL SALPINGECTOMY Bilateral 06/23/2012   Procedure: BILATERAL SALPINGECTOMY;  Surgeon: Krystal Deaner, MD;   Location: [facility];  Service: Gynecology;  Laterality: Bilateral;  . BREAST BIOPSY Left   . BREAST LUMPECTOMY Right 03/23/2017  . BREAST LUMPECTOMY WITH RADIOACTIVE SEED AND SENTINEL LYMPH NODE BIOPSY Right 03/23/2017   Procedure: BREAST LUMPECTOMY WITH RADIOACTIVE SEED AND SENTINEL LYMPH NODE BIOPSY;  Surgeon: Ebbie Cough, MD;  Location: [facility] OR;  Service: General;  Laterality: Right;  . LAPAROSCOPIC SUPRACERVICAL HYSTERECTOMY N/A 06/23/2012   Procedure: LAPAROSCOPIC SUPRACERVICAL HYSTERECTOMY;  Surgeon: Krystal Deaner, MD;  Location: [facility];  Service: Gynecology;  Laterality: N/A;  . PORT-A-CATH REMOVAL N/A 08/13/2017   Procedure: REMOVAL PORT-A-CATH;  Surgeon: Ebbie Cough, MD;  Location: [facility];  Service: General;  Laterality: N/A;  . PORTACATH PLACEMENT Right 03/23/2017   Procedure: INSERTION PORT-A-CATH;  Surgeon: Ebbie Cough, MD;  Location: [facility] OR;  Service: General;  Laterality: Right;  . TUBAL LIGATION    . VULVAR LESION REMOVAL N/A 11/01/2014   Procedure: MONS PUBIS LESION;  Surgeon: Krystal Deaner, MD;  Location: [facility];  Service: Gynecology;  Laterality: N/A;  local anesthesia with IV sedation if needed  . WISDOM TOOTH EXTRACTION      SOCIAL HISTORY: Social History   Socioeconomic History  . Marital status: Married    Spouse name: Not on file  . Number of children: Not on file  . Years of education: Not on file  . Highest education level: Not on file  Occupational History  . Not on file  Tobacco Use  . Smoking status: Never  . Smokeless tobacco: Never  Substance and Sexual Activity  . Alcohol use: No  . Drug use: No  . Sexual activity: Yes    Partners: Male    Birth control/protection: Surgical  Other Topics Concern  . 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 Worried About Programme Researcher, Broadcasting/film/video in the Last Year: Never true   . Ran Out of Food in the Last Year: Never true  Transportation Needs: No Transportation Needs (02/22/2024)   PRAPARE - Transportation   . Lack of Transportation (Medical): No   . Lack of Transportation (Non-Medical): No  Physical Activity: Not on file  Stress: No Stress Concern Present (02/22/2024)   [employer] of Occupational Health - Occupational Stress Questionnaire   . Feeling of Stress: Not at all  Social Connections: Not on file  Intimate Partner Violence: Not At Risk (02/22/2024)   Humiliation, Afraid, Rape, and Kick questionnaire   . Fear of Current or Ex-Partner: No   . Emotionally Abused: No   . Physically Abused: No   . Sexually Abused: No    FAMILY HISTORY: Family History  Problem Relation Age of Onset  . Hypertension Mother   . Diabetes Mellitus II Mother   . Breast cancer Mother 6  stage 3, s/p mastectomy  . Hypertension Sister   . Hypertension Sister   . Diabetes type II Maternal Grandmother   . Hypertension Maternal Grandmother   . Liver cancer Brother   . Pancreatic cancer Neg Hx   . Colon cancer Neg Hx     Review of Systems - Oncology    PHYSICAL EXAMINATION    Vitals:   02/22/24 1128  BP: 139/84  Pulse: 80  Resp: 17  Temp: (!) 97.2 F (36.2 C)  SpO2: 100%    Physical Exam  LABORATORY DATA:  CBC    Component Value Date/Time   WBC 5.9 11/05/2022 0827   WBC 5.5 10/02/2020 0814   RBC 4.70 11/05/2022 0827   RBC 4.76 10/02/2020 0814   HGB 13.8 11/05/2022 0827   HGB 14.8 02/18/2017 1221   HCT 42.0 11/05/2022 0827   HCT 43.3 02/18/2017 1221   PLT 250 11/05/2022 0827   MCV 89 11/05/2022 0827   MCV 88.5 02/18/2017 1221   MCH 29.4 11/05/2022 0827   MCH 29.6  10/02/2020 0814   MCHC 32.9 11/05/2022 0827   MCHC 34.1 10/02/2020 0814   RDW 12.0 11/05/2022 0827   RDW 12.6 02/18/2017 1221   LYMPHSABS 2.0 11/05/2022 0827   LYMPHSABS 2.1 02/18/2017 1221   MONOABS 0.5 10/02/2020 0814   MONOABS 0.5 02/18/2017 1221   EOSABS 0.2 11/05/2022 0827   BASOSABS 0.0 11/05/2022 0827   BASOSABS 0.0 02/18/2017 1221    CMP     Component Value Date/Time   NA 142 09/04/2023 0812   NA 139 11/05/2022 0827   NA 142 02/18/2017 1221   K 4.5 09/04/2023 0812   K 4.6 02/18/2017 1221   CL 105 09/04/2023 0812   CO2 31 09/04/2023 0812   CO2 27 02/18/2017 1221   GLUCOSE 89 09/04/2023 0812   GLUCOSE 95 02/18/2017 1221   BUN 20 09/04/2023 0812   BUN 17 11/05/2022 0827   BUN 18.4 02/18/2017 1221   CREATININE 0.92 09/04/2023 0812   CREATININE 1.00 10/02/2020 0805   CREATININE 1.0 02/18/2017 1221   CALCIUM 9.8 09/04/2023 0812   CALCIUM 10.3 02/18/2017 1221   PROT 7.3 09/04/2023 0812   PROT 7.0 11/05/2022 0827   PROT 8.3 02/18/2017 1221   ALBUMIN 4.6 09/04/2023 0812   ALBUMIN 4.6 11/05/2022 0827   ALBUMIN 4.4 02/18/2017 1221   AST 23 09/04/2023 0812   AST 35 10/02/2020 0805   AST 22 02/18/2017 1221   ALT 14 09/04/2023 0812   ALT 28 10/02/2020 0805   ALT 18 02/18/2017 1221   ALKPHOS 81 09/04/2023 0812   ALKPHOS 89 02/18/2017 1221   BILITOT 0.5 09/04/2023 0812   BILITOT 0.4 11/05/2022 0827   BILITOT 0.6 10/02/2020 0805   BILITOT 0.48 02/18/2017 1221   GFRNONAA >60 10/02/2020 0805   GFRAA >60 10/03/2019 0915   GFRAA >60 03/01/2018 0818       PENDING LABS:   RADIOGRAPHIC STUDIES:  No results found.   PATHOLOGY:     ASSESSMENT and THERAPY PLAN:   No problem-specific Assessment & Plan notes found for this encounter.   No orders of the defined types were placed in this encounter.   All questions were answered. The patient knows to call the clinic with any problems, questions or concerns. We can certainly see the patient much sooner if  necessary. This note was electronically signed. Morna JAYSON Kendall, NP 02/22/2024

## 2024-02-23 ENCOUNTER — Encounter: Payer: Self-pay | Admitting: Oncology

## 2024-03-03 ENCOUNTER — Ambulatory Visit

## 2024-03-08 ENCOUNTER — Encounter: Admitting: Family

## 2024-03-08 DIAGNOSIS — N951 Menopausal and female climacteric states: Secondary | ICD-10-CM | POA: Diagnosis not present

## 2024-03-08 DIAGNOSIS — Z01419 Encounter for gynecological examination (general) (routine) without abnormal findings: Secondary | ICD-10-CM | POA: Diagnosis not present

## 2024-03-08 DIAGNOSIS — R8290 Unspecified abnormal findings in urine: Secondary | ICD-10-CM | POA: Diagnosis not present

## 2024-03-16 ENCOUNTER — Encounter: Payer: Self-pay | Admitting: Family

## 2024-03-16 ENCOUNTER — Ambulatory Visit: Admitting: Family

## 2024-03-16 VITALS — BP 130/82 | HR 77 | Temp 98.4°F | Resp 16 | Ht 60.0 in | Wt 134.4 lb

## 2024-03-16 DIAGNOSIS — Z0000 Encounter for general adult medical examination without abnormal findings: Secondary | ICD-10-CM | POA: Diagnosis not present

## 2024-03-16 DIAGNOSIS — J309 Allergic rhinitis, unspecified: Secondary | ICD-10-CM | POA: Diagnosis not present

## 2024-03-16 DIAGNOSIS — I10 Essential (primary) hypertension: Secondary | ICD-10-CM

## 2024-03-16 DIAGNOSIS — K219 Gastro-esophageal reflux disease without esophagitis: Secondary | ICD-10-CM

## 2024-03-16 DIAGNOSIS — Z23 Encounter for immunization: Secondary | ICD-10-CM | POA: Diagnosis not present

## 2024-03-16 DIAGNOSIS — E785 Hyperlipidemia, unspecified: Secondary | ICD-10-CM

## 2024-03-16 NOTE — Assessment & Plan Note (Addendum)
 Lab Results  Component Value Date   CHOL 230 (H) 09/04/2023   HDL 89.20 09/04/2023   LDLCALC 132 (H) 09/04/2023   TRIG 45.0 09/04/2023   CHOLHDL 3 09/04/2023    Cholesterol slightly elevated, HDL excellent. No immediate concerns. - Continue healthy diet and exercise regimen. - Recheck cholesterol in six months.

## 2024-03-16 NOTE — Assessment & Plan Note (Signed)
  Routine wellness visit with immunizations up to date except hepatitis B. Cholesterol slightly elevated, HDL excellent. Blood pressure well-controlled. Kidney function normal. - Administered hepatitis B vaccine, scheduled second dose in one month. - Discussed COVID booster availability at pharmacies. - Continue healthy diet and exercise. - Recheck cholesterol and kidney function in six months.

## 2024-03-16 NOTE — Progress Notes (Signed)
 Subjective:     Patient ID: Kelsey Henry, female    DOB: May 21, 1965, 59 y.o.   MRN: [id number]  Chief Complaint  Patient presents with   Annual Exam    HPI  Discussed the use of AI scribe software for clinical note transcription with the patient, who gave verbal consent to proceed.  History of Present Illness Kelsey Henry is a 58 year old female who presents for an annual physical exam.  She has a history of hypertension and is currently taking amlodipine  5 mg daily. Her most recent blood pressure reading was 130/82 mmHg. Her cholesterol was checked in May and was slightly elevated, but her HDL levels were excellent. Kidney function tests from May were normal.  Her immunizations are up to date, including flu, tetanus, pneumonia, and shingles vaccines. She has not received the hepatitis B series. Her last colonoscopy is valid until 2030, and her Pap smear from November 2023 was negative for high-risk HPV, valid until 2028. Her mammogram earlier this month was normal, with no changes from a previous lumpectomy.  She maintains a healthy lifestyle with a balanced diet and regular exercise. 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 No current issues with cough, cold symptoms, swelling in her legs, skin concerns, hearing, vision, digestion, urinary issues, muscle or joint pain, headaches, depression, or anxiety.  She has no history of surgeries this year and reports no changes in her family medical history. She does not consume alcohol, use drugs, tobacco, or vape. Patient presents today for complete physical.  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 Colonoscopy: 2020 due 2030 Pap Smear: up to date until 11/28 Mammogram: normal 11/25 Vision: up to date Dental: up to date      Health Maintenance Due  Topic Date Due   Hepatitis B Vaccines 19-59 Average Risk (1 of 3 - 19+  3-dose series) Never done    Past Medical History:  Diagnosis Date   Cancer (HCC)    breast   Genetic testing 02/19/2017   STAT Breast panel with reflext to Multi-Cancer panel (83 genes) @ [facility] - No pathogenic mutations detected   GERD (gastroesophageal reflux disease)    no meds   Personal history of chemotherapy    2019   Personal history of radiation therapy    2019   Seasonal allergies    SVD (spontaneous vaginal delivery)    x 1    Past Surgical History:  Procedure Laterality Date   ABDOMINAL HYSTERECTOMY  2000   BILATERAL SALPINGECTOMY Bilateral 06/23/2012   Procedure: BILATERAL SALPINGECTOMY;  Surgeon: Krystal Deaner, MD;  Location: [facility];  Service: Gynecology;  Laterality: Bilateral;   BREAST BIOPSY Left    BREAST LUMPECTOMY Right 03/23/2017   BREAST LUMPECTOMY WITH RADIOACTIVE SEED AND SENTINEL LYMPH NODE BIOPSY Right 03/23/2017   Procedure: BREAST LUMPECTOMY WITH RADIOACTIVE SEED AND SENTINEL LYMPH NODE BIOPSY;  Surgeon: Ebbie Cough, MD;  Location: [facility];  Service: General;  Laterality: Right;   LAPAROSCOPIC SUPRACERVICAL HYSTERECTOMY N/A 06/23/2012   Procedure: LAPAROSCOPIC SUPRACERVICAL HYSTERECTOMY;  Surgeon: Krystal Deaner, MD;  Location: [facility];  Service: Gynecology;  Laterality: N/A;   PORT-A-CATH REMOVAL N/A 08/13/2017   Procedure: REMOVAL PORT-A-CATH;  Surgeon: Ebbie Cough, MD;  Location: [facility];  Service: General;  Laterality: N/A;   PORTACATH PLACEMENT Right 03/23/2017   Procedure: INSERTION PORT-A-CATH;  Surgeon: Ebbie Cough, MD;  Location: [facility] OR;  Service: General;  Laterality:  Right;   TUBAL LIGATION     VULVAR LESION REMOVAL N/A 11/01/2014   Procedure: MONS PUBIS LESION;  Surgeon: Krystal Deaner, MD;  Location: [facility];  Service: Gynecology;  Laterality: N/A;  local anesthesia with IV sedation if needed   WISDOM TOOTH EXTRACTION      Family History  Problem Relation Age of Onset   Hypertension Mother     Diabetes Mellitus II Mother    Breast cancer Mother 53       stage 3, s/p mastectomy   Hypertension Sister    Hypertension Sister    Diabetes type II Maternal Grandmother    Hypertension Maternal Grandmother    Liver cancer Brother    Pancreatic cancer Neg Hx    Colon cancer Neg Hx     Social History   Socioeconomic History   Marital status: Married    Spouse name: Not on file   Number of children: Not on file   Years of education: Not on file   Highest education level: Not on file  Occupational History   Not on file  Tobacco Use   Smoking status: Never   Smokeless tobacco: Never  Substance and Sexual Activity   Alcohol use: No   Drug use: No   Sexual activity: Yes    Partners: Male    Birth control/protection: Surgical  Other Topics Concern   Not on file  Social History Narrative   Works as an scientist, 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   One son who lives in [place]   Married   Enjoys, freight forwarder, reading, bowling (has her own league) [employer]   Part time hair stylist   Social Drivers of Corporate Investment Banker Strain: Not on file  Food Insecurity: No Food Insecurity (02/22/2024)   Hunger Vital Sign    Worried About Running Out of Food in the Last Year: Never true    Ran Out of Food in the Last Year: Never true  Transportation Needs: No Transportation Needs (02/22/2024)   PRAPARE - Administrator, Civil Service (Medical): No    Lack of Transportation (Non-Medical): No  Physical Activity: Not on file  Stress: No Stress Concern Present (02/22/2024)   [employer] of Occupational Health - Occupational Stress Questionnaire    Feeling of Stress: Not at all  Social Connections: Not on file  Intimate Partner Violence: Not At Risk (02/22/2024)   Humiliation, Afraid, Rape, and Kick questionnaire    Fear of Current or Ex-Partner: No    Emotionally Abused: No    Physically Abused: No    Sexually Abused: No     Outpatient Medications Prior to Visit  Medication Sig Dispense Refill   amLODipine  (NORVASC ) 5 MG tablet TAKE 1 TABLET (5 MG TOTAL) BY MOUTH DAILY. 90 tablet 1   Biotin 1 MG CAPS Take by mouth. 30 capsule    cholecalciferol (VITAMIN D3) 25 MCG (1000 UNIT) tablet Take 1,000 Units by mouth daily.     fluticasone (VERAMYST) 27.5 MCG/SPRAY nasal spray Place 2 sprays into the nose daily.     Multiple Vitamins-Minerals (MULTIVITAMIN ADULT) TABS Take by mouth.     No facility-administered medications prior to visit.    Allergies  Allergen Reactions   Aspirin Hives    Review of Systems  Constitutional:  Positive for weight loss.  HENT:  Negative for congestion and hearing loss.   Eyes:  Negative for blurred vision.  Respiratory:  Negative for cough.  Cardiovascular:  Negative for leg swelling.  Gastrointestinal:  Negative for constipation and diarrhea.  Genitourinary:  Negative for dysuria and frequency.  Musculoskeletal:  Negative for joint pain and myalgias.  Skin:  Negative for rash.  Neurological:  Negative for headaches.  Psychiatric/Behavioral:         Denies depression/anxiety       Objective:    Physical Exam   BP 130/82 (BP Location: Left Arm, Patient Position: Sitting, Cuff Size: Normal)   Pulse 77   Temp 98.4 F (36.9 C) (Oral)   Resp 16   Ht 5' (1.524 m)   Wt 134 lb 6.4 oz (61 kg)   LMP 05/26/2012   SpO2 100%   BMI 26.25 kg/m  Wt Readings from Last 3 Encounters:  03/16/24 134 lb 6.4 oz (61 kg)  02/22/24 138 lb (62.6 kg)  09/04/23 135 lb (61.2 kg)   Physical Exam  Constitutional: She is oriented to person, place, and time. She appears well-developed and well-nourished. No distress.  HENT:  Head: Normocephalic and atraumatic.  Right Ear: Tympanic membrane and ear canal normal.  Left Ear: Tympanic membrane and ear canal normal.  Mouth/Throat: Oropharynx is clear and moist.  Eyes: Pupils are equal, round, and reactive to light. No scleral icterus.   Neck: Normal range of motion. No thyromegaly present.  Cardiovascular: Normal rate and regular rhythm.   No murmur heard. Pulmonary/Chest: Effort normal and breath sounds normal. No respiratory distress. He has no wheezes. She has no rales. She exhibits no tenderness.  Abdominal: Soft. Bowel sounds are normal. She exhibits no distension and no mass. There is no tenderness. There is no rebound and no guarding.  Musculoskeletal: She exhibits no edema.  Lymphadenopathy:    She has no cervical adenopathy.  Neurological: She is alert and oriented to person, place, and time. She has normal patellar reflexes. She exhibits normal muscle tone. Coordination normal.  Skin: Skin is warm and dry.  Psychiatric: She has a normal mood and affect. Her behavior is normal. Judgment and thought content normal.  Breast/Pelvic: deferred           Assessment & Plan:       Assessment & Plan:   Problem List Items Addressed This Visit       Unprioritized   Preventative health care - Primary    Routine wellness visit with immunizations up to date except hepatitis B. Cholesterol slightly elevated, HDL excellent. Blood pressure well-controlled. Kidney function normal. - Administered hepatitis B vaccine, scheduled second dose in one month. - Discussed COVID booster availability at pharmacies. - Continue healthy diet and exercise. - Recheck cholesterol and kidney function in six months.      Hypertension   Stable on amlodipine  5mg , continue same.       Hyperlipidemia   Lab Results  Component Value Date   CHOL 230 (H) 09/04/2023   HDL 89.20 09/04/2023   LDLCALC 132 (H) 09/04/2023   TRIG 45.0 09/04/2023   CHOLHDL 3 09/04/2023    Cholesterol slightly elevated, HDL excellent. No immediate concerns. - Continue healthy diet and exercise regimen. - Recheck cholesterol in six months.       RESOLVED: Gastro-esophageal reflux disease without esophagitis   Stable without medication.        Allergic rhinitis   Stable this time of year without medication.      Assessment & Plan    I am having Jennifermarie Henry maintain her Multivitamin Adult, Biotin, cholecalciferol, fluticasone, and amLODipine .  No orders  of the defined types were placed in this encounter.

## 2024-03-16 NOTE — Assessment & Plan Note (Signed)
 Stable this time of year without medication.

## 2024-03-16 NOTE — Patient Instructions (Signed)
  VISIT SUMMARY: Today, you had your annual physical exam. Your blood pressure is well-controlled, and your cholesterol levels, while slightly elevated, are balanced by excellent HDL levels. Your kidney function is normal, and all your immunizations are up to date except for the hepatitis B series, which we started today. Your recent mammogram and Pap smear were normal, and you maintain a healthy lifestyle with a balanced diet and regular exercise.  YOUR PLAN: -ADULT WELLNESS VISIT: This visit was a routine check-up to monitor your overall health. Your immunizations are up to date except for the hepatitis B series, which we started today. Your cholesterol is slightly elevated, but your HDL (good cholesterol) levels are excellent. Your blood pressure is well-controlled, and your kidney function is normal. Continue with your healthy diet and exercise. We will recheck your cholesterol and kidney function in six months.  -ESSENTIAL HYPERTENSION: Hypertension means high blood pressure. Your blood pressure is well-controlled with your current medication, Amlodipine  5 mg daily. We will recheck your blood pressure in six months.  -HYPERLIPIDEMIA: Hyperlipidemia means having high levels of fats (lipids) in your blood, such as cholesterol. Your cholesterol is slightly elevated, but your HDL (good cholesterol) levels are excellent. Continue with your healthy diet and exercise regimen. We will recheck your cholesterol in six months.  INSTRUCTIONS: You received the first dose of the hepatitis B vaccine today. Please return in one month for the second dose. Also, consider getting a COVID booster at your local pharmacy. We will recheck your cholesterol, kidney function, and blood pressure in six months.

## 2024-03-16 NOTE — Assessment & Plan Note (Signed)
 Stable without medication

## 2024-03-16 NOTE — Assessment & Plan Note (Signed)
 Stable on amlodipine  5mg , continue same.

## 2024-04-13 ENCOUNTER — Ambulatory Visit

## 2024-04-21 ENCOUNTER — Ambulatory Visit (INDEPENDENT_AMBULATORY_CARE_PROVIDER_SITE_OTHER)

## 2024-04-21 DIAGNOSIS — Z23 Encounter for immunization: Secondary | ICD-10-CM

## 2024-04-21 NOTE — Progress Notes (Signed)
 Pt here today for Heplisav-B  #2 per Melissa.   Heplisav-B  0.81mL injected into L deltoid IM. Pt tolerated injection well.

## 2024-09-14 ENCOUNTER — Ambulatory Visit: Admitting: Family

## 2025-02-21 ENCOUNTER — Inpatient Hospital Stay: Admitting: Adult Health
